# Patient Record
Sex: Female | Born: 1980
Health system: Southern US, Community
[De-identification: ages and names within clinical notes are randomized; demographics above are authoritative.]

## PROBLEM LIST (undated history)

## (undated) DIAGNOSIS — D6851 Activated protein C resistance: Secondary | ICD-10-CM

## (undated) DIAGNOSIS — C801 Malignant (primary) neoplasm, unspecified: Secondary | ICD-10-CM

## (undated) DIAGNOSIS — F32A Depression, unspecified: Secondary | ICD-10-CM

## (undated) DIAGNOSIS — G43909 Migraine, unspecified, not intractable, without status migrainosus: Secondary | ICD-10-CM

## (undated) DIAGNOSIS — I2699 Other pulmonary embolism without acute cor pulmonale: Secondary | ICD-10-CM

## (undated) DIAGNOSIS — R87629 Unspecified abnormal cytological findings in specimens from vagina: Secondary | ICD-10-CM

## (undated) DIAGNOSIS — K5792 Diverticulitis of intestine, part unspecified, without perforation or abscess without bleeding: Secondary | ICD-10-CM

## (undated) DIAGNOSIS — K219 Gastro-esophageal reflux disease without esophagitis: Secondary | ICD-10-CM

## (undated) DIAGNOSIS — M1990 Unspecified osteoarthritis, unspecified site: Secondary | ICD-10-CM

## (undated) DIAGNOSIS — F329 Major depressive disorder, single episode, unspecified: Secondary | ICD-10-CM

## (undated) DIAGNOSIS — M359 Systemic involvement of connective tissue, unspecified: Secondary | ICD-10-CM

## (undated) DIAGNOSIS — C4491 Basal cell carcinoma of skin, unspecified: Secondary | ICD-10-CM

## (undated) DIAGNOSIS — F419 Anxiety disorder, unspecified: Secondary | ICD-10-CM

## (undated) HISTORY — DX: Diverticulitis of intestine, part unspecified, without perforation or abscess without bleeding: K57.92

## (undated) HISTORY — DX: Other pulmonary embolism without acute cor pulmonale: I26.99

## (undated) HISTORY — DX: Unspecified osteoarthritis, unspecified site: M19.90

## (undated) HISTORY — DX: Basal cell carcinoma of skin, unspecified: C44.91

## (undated) HISTORY — PX: UPPER GI ENDOSCOPY: SHX6162

---

## 2004-03-25 HISTORY — PX: LAPAROSCOPIC ENDOMETRIOSIS FULGURATION: SUR769

## 2004-06-15 ENCOUNTER — Ambulatory Visit: Payer: Self-pay | Admitting: Family Medicine

## 2004-06-22 ENCOUNTER — Ambulatory Visit: Payer: Self-pay | Admitting: Unknown Physician Specialty

## 2004-06-27 ENCOUNTER — Ambulatory Visit: Payer: Self-pay | Admitting: Urology

## 2004-09-14 ENCOUNTER — Ambulatory Visit: Payer: Self-pay | Admitting: Unknown Physician Specialty

## 2004-10-11 ENCOUNTER — Ambulatory Visit: Payer: Self-pay | Admitting: Unknown Physician Specialty

## 2004-10-23 ENCOUNTER — Ambulatory Visit: Payer: Self-pay | Admitting: Unknown Physician Specialty

## 2004-11-23 ENCOUNTER — Ambulatory Visit: Payer: Self-pay | Admitting: Unknown Physician Specialty

## 2005-03-21 ENCOUNTER — Ambulatory Visit: Payer: Self-pay | Admitting: Obstetrics & Gynecology

## 2008-01-06 ENCOUNTER — Ambulatory Visit: Payer: Self-pay

## 2011-03-26 HISTORY — PX: FOOT SURGERY: SHX648

## 2011-04-08 ENCOUNTER — Emergency Department: Payer: Self-pay | Admitting: Emergency Medicine

## 2011-04-08 LAB — COMPREHENSIVE METABOLIC PANEL
Albumin: 4.1 g/dL (ref 3.4–5.0)
BUN: 11 mg/dL (ref 7–18)
Calcium, Total: 8.7 mg/dL (ref 8.5–10.1)
EGFR (African American): >60
EGFR (Non-African Amer.): >60
Glucose: 78 mg/dL (ref 65–99)
Osmolality: 280 (ref 275–301)
Potassium: 3.6 mmol/L (ref 3.5–5.1)
SGOT(AST): 22 U/L (ref 15–37)
SGPT (ALT): 21 U/L
Sodium: 141 mmol/L (ref 136–145)
Total Protein: 7.8 g/dL (ref 6.4–8.2)

## 2011-04-08 LAB — CBC
HGB: 13.4 g/dL (ref 12.0–16.0)
MCH: 30.6 pg (ref 26.0–34.0)
MCHC: 34.2 g/dL (ref 32.0–36.0)
MCV: 89 fL (ref 80–100)
RBC: 4.37 10*6/uL (ref 3.80–5.20)
WBC: 6.9 10*3/uL (ref 3.6–11.0)

## 2011-08-09 ENCOUNTER — Emergency Department: Payer: Self-pay | Admitting: Emergency Medicine

## 2011-08-09 LAB — URINALYSIS, COMPLETE
Bilirubin,UR: NEGATIVE
Glucose,UR: NEGATIVE mg/dL (ref 0–75)
Ketone: NEGATIVE
Nitrite: NEGATIVE
Ph: 7 (ref 4.5–8.0)
Protein: NEGATIVE
Specific Gravity: 1.001 (ref 1.003–1.030)
Squamous Epithelial: NONE SEEN
WBC UR: NONE SEEN /HPF (ref 0–5)

## 2011-08-09 LAB — BASIC METABOLIC PANEL
BUN: 10 mg/dL (ref 7–18)
Chloride: 108 mmol/L — ABNORMAL HIGH (ref 98–107)
Co2: 24 mmol/L (ref 21–32)
Creatinine: 0.98 mg/dL (ref 0.60–1.30)
EGFR (Non-African Amer.): >60
Glucose: 93 mg/dL (ref 65–99)
Osmolality: 282 (ref 275–301)
Potassium: 3.7 mmol/L (ref 3.5–5.1)

## 2011-08-09 LAB — CBC
HGB: 13 g/dL (ref 12.0–16.0)
MCH: 30.6 pg (ref 26.0–34.0)
MCV: 88 fL (ref 80–100)
RBC: 4.26 10*6/uL (ref 3.80–5.20)
WBC: 4.2 10*3/uL (ref 3.6–11.0)

## 2012-01-24 HISTORY — PX: FOOT SURGERY: SHX648

## 2013-04-29 ENCOUNTER — Ambulatory Visit: Payer: Self-pay

## 2014-01-27 ENCOUNTER — Ambulatory Visit: Payer: Self-pay

## 2014-02-03 ENCOUNTER — Ambulatory Visit: Payer: Self-pay | Admitting: Urgent Care

## 2014-02-03 LAB — HCG, QUANTITATIVE, PREGNANCY: Beta Hcg, Quant.: <1 m[IU]/mL — ABNORMAL LOW

## 2014-02-04 ENCOUNTER — Ambulatory Visit: Payer: Self-pay | Admitting: Urgent Care

## 2014-02-10 ENCOUNTER — Ambulatory Visit: Payer: Self-pay | Admitting: Gastroenterology

## 2014-02-22 ENCOUNTER — Ambulatory Visit: Payer: Self-pay | Admitting: Urgent Care

## 2014-02-22 LAB — HCG, QUANTITATIVE, PREGNANCY: Beta Hcg, Quant.: <1 m[IU]/mL — ABNORMAL LOW

## 2014-02-22 LAB — URINALYSIS, COMPLETE
BLOOD: NEGATIVE
Bilirubin,UR: NEGATIVE
Glucose,UR: NEGATIVE mg/dL (ref 0–75)
Ketone: NEGATIVE
LEUKOCYTE ESTERASE: NEGATIVE
NITRITE: NEGATIVE
Ph: 7 (ref 4.5–8.0)
Protein: NEGATIVE
Specific Gravity: 1.025 (ref 1.003–1.030)

## 2014-03-22 ENCOUNTER — Ambulatory Visit: Payer: Self-pay

## 2014-03-25 ENCOUNTER — Ambulatory Visit: Payer: Self-pay

## 2014-03-25 HISTORY — PX: DILATION AND CURETTAGE OF UTERUS: SHX78

## 2014-11-10 ENCOUNTER — Encounter
Admission: RE | Disposition: A | Discharge status: Home / Self Care | Payer: Self-pay | Source: Ambulatory Visit | Attending: Obstetrics & Gynecology

## 2014-11-10 ENCOUNTER — Ambulatory Visit
Admission: RE | Admit: 2014-11-10 | Discharge: 2014-11-10 | Disposition: A | Discharge status: Home / Self Care | Payer: BC Managed Care – PPO | Source: Ambulatory Visit | Attending: Obstetrics & Gynecology | Admitting: Obstetrics & Gynecology

## 2014-11-10 ENCOUNTER — Ambulatory Visit: Payer: BC Managed Care – PPO | Admitting: Certified Registered"

## 2014-11-10 ENCOUNTER — Encounter: Payer: Self-pay | Admitting: *Deleted

## 2014-11-10 DIAGNOSIS — O021 Missed abortion: Secondary | ICD-10-CM | POA: Diagnosis not present

## 2014-11-10 DIAGNOSIS — Z3A01 Less than 8 weeks gestation of pregnancy: Secondary | ICD-10-CM | POA: Insufficient documentation

## 2014-11-10 DIAGNOSIS — Z9109 Other allergy status, other than to drugs and biological substances: Secondary | ICD-10-CM | POA: Insufficient documentation

## 2014-11-10 HISTORY — PX: DILATION AND EVACUATION: SHX1459

## 2014-11-10 LAB — BASIC METABOLIC PANEL WITH GFR
Anion gap: 7 (ref 5–15)
BUN: 10 mg/dL (ref 6–20)
CO2: 23 mmol/L (ref 22–32)
Calcium: 8.9 mg/dL (ref 8.9–10.3)
Chloride: 106 mmol/L (ref 101–111)
Creatinine, Ser: 0.82 mg/dL (ref 0.44–1.00)
GFR calc Af Amer: >60 mL/min
GFR calc non Af Amer: >60 mL/min
Glucose, Bld: 90 mg/dL (ref 65–99)
Potassium: 3.9 mmol/L (ref 3.5–5.1)
Sodium: 136 mmol/L (ref 135–145)

## 2014-11-10 LAB — TYPE AND SCREEN
ABO/RH(D): A POS
Antibody Screen: NEGATIVE

## 2014-11-10 LAB — CBC
HEMATOCRIT: 42 % (ref 35.0–47.0)
HEMOGLOBIN: 14.2 g/dL (ref 12.0–16.0)
MCH: 30.1 pg (ref 26.0–34.0)
MCHC: 33.7 g/dL (ref 32.0–36.0)
MCV: 89.1 fL (ref 80.0–100.0)
PLATELETS: 189 10*3/uL (ref 150–440)
RBC: 4.72 MIL/uL (ref 3.80–5.20)
RDW: 12.9 % (ref 11.5–14.5)
WBC: 5.7 10*3/uL (ref 3.6–11.0)

## 2014-11-10 LAB — ABO/RH: ABO/RH(D): A POS

## 2014-11-10 SURGERY — DILATION AND EVACUATION, UTERUS
Anesthesia: General

## 2014-11-10 MED ORDER — ONDANSETRON HCL 4 MG/2ML IJ SOLN
INTRAMUSCULAR | Status: DC | PRN
  Administered 2014-11-10: 4 mg via INTRAVENOUS

## 2014-11-10 MED ORDER — IBUPROFEN 600 MG PO TABS: 600.0000 mg | ORAL_TABLET | Freq: Four times a day (QID) | ORAL | Status: DC | PRN

## 2014-11-10 MED ORDER — PROPOFOL 10 MG/ML IV BOLUS
INTRAVENOUS | Status: DC | PRN
  Administered 2014-11-10: 200 mg via INTRAVENOUS

## 2014-11-10 MED ORDER — FENTANYL CITRATE (PF) 100 MCG/2ML IJ SOLN
25.0000 ug | INTRAMUSCULAR | Status: DC | PRN
  Administered 2014-11-10 (×4): 25 ug via INTRAVENOUS

## 2014-11-10 MED ORDER — ONDANSETRON HCL 4 MG/2ML IJ SOLN
INTRAMUSCULAR | Status: AC
  Filled 2014-11-10: qty 2

## 2014-11-10 MED ORDER — DOXYCYCLINE HYCLATE 200 MG PO TBEC: 200.0000 mg | DELAYED_RELEASE_TABLET | Freq: Once | ORAL | Status: DC

## 2014-11-10 MED ORDER — MIDAZOLAM HCL 2 MG/2ML IJ SOLN
INTRAMUSCULAR | Status: DC | PRN
  Administered 2014-11-10: 2 mg via INTRAVENOUS

## 2014-11-10 MED ORDER — FENTANYL CITRATE (PF) 100 MCG/2ML IJ SOLN
INTRAMUSCULAR | Status: DC | PRN
  Administered 2014-11-10 (×4): 50 ug via INTRAVENOUS

## 2014-11-10 MED ORDER — ONDANSETRON HCL 4 MG/2ML IJ SOLN
4.0000 mg | Freq: Once | INTRAMUSCULAR | Status: AC | PRN
  Administered 2014-11-10: 4 mg via INTRAVENOUS

## 2014-11-10 MED ORDER — DOXYCYCLINE HYCLATE 100 MG IV SOLR
100.0000 mg | Freq: Once | INTRAVENOUS | Status: AC
  Administered 2014-11-10: 100 mg via INTRAVENOUS
  Filled 2014-11-10: qty 100

## 2014-11-10 MED ORDER — LIDOCAINE HCL (CARDIAC) 20 MG/ML IV SOLN
INTRAVENOUS | Status: DC | PRN
  Administered 2014-11-10: 100 mg via INTRAVENOUS

## 2014-11-10 MED ORDER — DEXAMETHASONE SODIUM PHOSPHATE 4 MG/ML IJ SOLN
INTRAMUSCULAR | Status: DC | PRN
  Administered 2014-11-10: 10 mg via INTRAVENOUS

## 2014-11-10 MED ORDER — FENTANYL CITRATE (PF) 100 MCG/2ML IJ SOLN
INTRAMUSCULAR | Status: AC
  Administered 2014-11-10: 25 ug via INTRAVENOUS
  Filled 2014-11-10: qty 2

## 2014-11-10 MED ORDER — LACTATED RINGERS IV SOLN
INTRAVENOUS | Status: DC | PRN
  Administered 2014-11-10: 13:00:00 via INTRAVENOUS

## 2014-11-10 MED ORDER — LACTATED RINGERS IV SOLN
INTRAVENOUS | Status: DC
  Administered 2014-11-10: 11:00:00 via INTRAVENOUS

## 2014-11-10 SURGICAL SUPPLY — 18 items
CATH ROBINSON RED A/P 16FR (CATHETERS) ×3 IMPLANT
GLOVE BIOGEL PI IND STRL 6.5 (GLOVE) ×3 IMPLANT
GLOVE BIOGEL PI INDICATOR 6.5 (GLOVE) ×6
GLOVE SURG SYN 6.5 ES PF (GLOVE) ×3 IMPLANT
GOWN STRL REUS W/ TWL LRG LVL3 (GOWN DISPOSABLE) ×2 IMPLANT
GOWN STRL REUS W/TWL LRG LVL3 (GOWN DISPOSABLE) ×4
KIT RM TURNOVER CYSTO AR (KITS) ×3 IMPLANT
NEEDLE SPNL 20GX3.5 QUINCKE YW (NEEDLE) ×3 IMPLANT
NS IRRIG 500ML POUR BTL (IV SOLUTION) ×3 IMPLANT
PACK DNC HYST (MISCELLANEOUS) ×3 IMPLANT
PAD OB MATERNITY 4.3X12.25 (PERSONAL CARE ITEMS) ×3 IMPLANT
PAD PREP 24X41 OB/GYN DISP (PERSONAL CARE ITEMS) ×3 IMPLANT
SET BERKELEY SUCTION TUBING (SUCTIONS) ×3 IMPLANT
SYRINGE 10CC LL (SYRINGE) ×3 IMPLANT
TOWEL OR 17X26 4PK STRL BLUE (TOWEL DISPOSABLE) ×3 IMPLANT
VACURETTE 10 RIGID CVD (CANNULA) IMPLANT
VACURETTE 12 RIGID CVD (CANNULA) IMPLANT
VACURETTE 8 RIGID CVD (CANNULA) ×3 IMPLANT

## 2014-11-10 NOTE — Anesthesia Procedure Notes (Signed)
Procedure Name: LMA Insertion Date/Time: 11/10/2014 12:53 PM Performed by: Silvana Newness Pre-anesthesia Checklist: Patient identified, Emergency Drugs available, Suction available, Patient being monitored and Timeout performed Patient Re-evaluated:Patient Re-evaluated prior to inductionOxygen Delivery Method: Circle system utilized Preoxygenation: Pre-oxygenation with 100% oxygen Intubation Type: IV induction Ventilation: Mask ventilation without difficulty LMA: LMA inserted LMA Size: 3.5 Number of attempts: 1 Placement Confirmation: positive ETCO2 and breath sounds checked- equal and bilateral Tube secured with: Tape Dental Injury: Teeth and Oropharynx as per pre-operative assessment

## 2014-11-10 NOTE — Transfer of Care (Signed)
Immediate Anesthesia Transfer of Care Note  Patient: Pamela Mills  Procedure(s) Performed: Procedure(s): DILATATION AND EVACUATION (N/A)  Patient Location: PACU  Anesthesia Type:General  Level of Consciousness: awake, alert , oriented and patient cooperative  Airway & Oxygen Therapy: Patient Spontanous Breathing and Patient connected to face mask oxygen  Post-op Assessment: Report given to RN, Post -op Vital signs reviewed and stable and Patient moving all extremities X 4  Post vital signs: Reviewed and stable  Last Vitals:  Filed Vitals:   11/10/14 1329  BP:   Pulse: 93  Temp:   Resp: 21    Complications: No apparent anesthesia complications

## 2014-11-10 NOTE — Anesthesia Preprocedure Evaluation (Signed)
Anesthesia Evaluation  Patient identified by MRN, date of birth, ID band Patient awake    Reviewed: Allergy & Precautions, NPO status , Patient's Chart, lab work & pertinent test results  Airway Mallampati: II       Dental no notable dental hx.    Pulmonary neg pulmonary ROS,    Pulmonary exam normal       Cardiovascular negative cardio ROS Normal cardiovascular exam    Neuro/Psych negative neurological ROS     GI/Hepatic negative GI ROS, Neg liver ROS,   Endo/Other  negative endocrine ROS  Renal/GU negative Renal ROS  negative genitourinary   Musculoskeletal negative musculoskeletal ROS (+)   Abdominal Normal abdominal exam  (+)   Peds negative pediatric ROS (+)  Hematology negative hematology ROS (+)   Anesthesia Other Findings   Reproductive/Obstetrics negative OB ROS                             Anesthesia Physical Anesthesia Plan  ASA: II  Anesthesia Plan: General   Post-op Pain Management:    Induction: Intravenous  Airway Management Planned: LMA  Additional Equipment:   Intra-op Plan:   Post-operative Plan: Extubation in OR  Informed Consent: I have reviewed the patients History and Physical, chart, labs and discussed the procedure including the risks, benefits and alternatives for the proposed anesthesia with the patient or authorized representative who has indicated his/her understanding and acceptance.     Plan Discussed with: CRNA  Anesthesia Plan Comments:         Anesthesia Quick Evaluation

## 2014-11-10 NOTE — Discharge Instructions (Signed)
Nothing in vagina for 2 weeks.  Take 214m doxycycline before bed tonight, with food in your stomach.  You should expect some bleeding, which will subside in the next few days, up to a week.  You should also have some cramping, which should be relieved with ibuprofen and a heating pad.  If your pain is more than this, or bleeding is very heavy, please notify Dr. WLeonides Schanz  Also call for fever/chills, or persistent nausea/vomiting.

## 2014-11-10 NOTE — Op Note (Signed)
Operative Report Suction Dilation and Curettage    Pre-operative Diagnosis: Missed Abortion  Post-operative Diagnosis: same.  Procedure: Suction D&C  Surgeon: Larey Days, MD  Assistant(s):  None  Anesthesia: LMA  Intraoperative medications: doxycycline 18m IV  Estimated Blood Loss:  minimal         Total IV Fluids: 4082m Urine Output: 3m4mnone collected)         Findings: Uterus measuring 8 weeks; normal cervix, vagina, perineum.   Specimens: products of conception         Complications:  None apparent  Disposition: VS stable to PACU          Indication for procedure/Consents: 33 43o. G2P0020 with ultrasound findings consistent with a missed abortion at 6w and 6 days and no cardiac activity (two ultrasounds taken 1week apart with same findings) here for scheduled surgery for the aforementioned diagnoses.  Risks of surgery were discussed with the patient including but not limited to: bleeding which may require transfusion; infection which may require antibiotics; injury to uterus or surrounding organs; intrauterine scarring which may impair future fertility; need for additional procedures including laparotomy or laparoscopy; and other postoperative/anesthesia complications. Written informed consent was obtained.     Procedure in Detail:  The patient was then taken to the operating room where anesthesia was administered and was found to be adequate.  After a formal timeout was performed, she was placed in the dorsal lithotomy position and examined with the above findings. She was then prepped and draped in the sterile manner.  A speculum was then placed in the patient's vagina and a single tooth tenaculum was applied to the anterior lip of the cervix.    The Her cervix was serially dilated to accommodate the 8 french curved suction curette with findings as above. Tissue was removed after 2 passes.  A sharp curettage was then performed until there was a gritty texture in  all four quadrants. The specimen was handed off to nursing. The tenaculum was removed from the anterior lip of the cervix and the vaginal speculum was removed after noting good hemostasis. The patient tolerated the procedure well and was taken to the recovery area awake and in stable condition.  The patient will be discharged to home as per PACU criteria.  Routine postoperative instructions given. She will follow up in the clinic in two to four weeks for postoperative evaluation.  CheLarey DaysD Gynecologist and ObsDublin Medical Center

## 2014-11-10 NOTE — H&P (Signed)
H&P Update  Please see paper H&P in chart.   Pt was last seen in my office, and complete history and physical performed.  The surgical history has been reviewed and remains accurate without interval change. The patient was re-examined and patient's physiologic condition has not changed significantly in the last day.  No new pharmacological allergies or types of therapy has been initiated.  Allergies  Allergen Reactions  . Other Anaphylaxis    gluten   @CURRENTMEDS @ Past Medical History  Diagnosis Date  . Endometriosis    Past Surgical History  Procedure Laterality Date  . Foot surgery Left 01/2012  . Upper gi endoscopy      BP 124/73 mmHg  Pulse 70  Temp(Src) 98.4 F (36.9 C) (Oral)  Resp 16  Ht 5' 8"  (1.727 m)  Wt 99.791 kg (220 lb)  BMI 33.46 kg/m2  SpO2 100%  LMP   NAD RRR no murmurs CTAB, no wheezing, resps unlabored +BS, soft, NTTP No c/c/e Pelvic exam deferred  The above history was confirmed with the patient. The condition still exists that makes this procedure necessary. Surgical plan includes suction D&C as confirmed on the consent. The treatment plan remains the same, without new options for care.  The patient understands the potential benefits and risks and the consents have been signed and placed on the chart.     Larey Days, MD Attending Obstetrician Gynecologist [facility]

## 2014-11-11 ENCOUNTER — Encounter: Payer: Self-pay | Admitting: Obstetrics & Gynecology

## 2014-11-11 LAB — SURGICAL PATHOLOGY

## 2014-11-11 NOTE — Anesthesia Postprocedure Evaluation (Signed)
  Anesthesia Post-op Note  Patient: Pamela Mills  Procedure(s) Performed: Procedure(s): DILATATION AND EVACUATION (N/A)  Anesthesia type:General  Patient location: PACU  Post pain: Pain level controlled  Post assessment: Post-op Vital signs reviewed, Patient's Cardiovascular Status Stable, Respiratory Function Stable, Patent Airway and No signs of Nausea or vomiting  Post vital signs: Reviewed and stable  Last Vitals:  Filed Vitals:   11/10/14 1601  BP: 130/83  Pulse: 67  Temp: 36.8 C  Resp: 16    Level of consciousness: awake, alert  and patient cooperative  Complications: No apparent anesthesia complications

## 2015-04-24 ENCOUNTER — Other Ambulatory Visit: Payer: Self-pay | Admitting: Obstetrics & Gynecology

## 2015-04-24 DIAGNOSIS — R1011 Right upper quadrant pain: Secondary | ICD-10-CM

## 2015-04-24 DIAGNOSIS — N63 Unspecified lump in unspecified breast: Secondary | ICD-10-CM

## 2015-04-27 ENCOUNTER — Other Ambulatory Visit: Payer: Self-pay | Admitting: Obstetrics & Gynecology

## 2015-04-27 ENCOUNTER — Ambulatory Visit
Admission: RE | Admit: 2015-04-27 | Discharge: 2015-04-27 | Disposition: A | Discharge status: Home / Self Care | Payer: BC Managed Care – PPO | Source: Ambulatory Visit | Attending: Obstetrics & Gynecology | Admitting: Obstetrics & Gynecology

## 2015-04-27 DIAGNOSIS — R1011 Right upper quadrant pain: Secondary | ICD-10-CM | POA: Insufficient documentation

## 2015-04-27 DIAGNOSIS — G8929 Other chronic pain: Secondary | ICD-10-CM | POA: Insufficient documentation

## 2015-05-03 ENCOUNTER — Ambulatory Visit
Admission: RE | Admit: 2015-05-03 | Discharge: 2015-05-03 | Disposition: A | Discharge status: Home / Self Care | Payer: BC Managed Care – PPO | Source: Ambulatory Visit | Attending: Obstetrics & Gynecology | Admitting: Obstetrics & Gynecology

## 2015-05-03 DIAGNOSIS — N6489 Other specified disorders of breast: Secondary | ICD-10-CM | POA: Diagnosis not present

## 2015-05-03 DIAGNOSIS — N63 Unspecified lump in unspecified breast: Secondary | ICD-10-CM

## 2015-05-29 ENCOUNTER — Other Ambulatory Visit: Payer: Self-pay | Admitting: Obstetrics & Gynecology

## 2015-05-29 DIAGNOSIS — R1011 Right upper quadrant pain: Secondary | ICD-10-CM

## 2015-05-29 DIAGNOSIS — Q630 Accessory kidney: Secondary | ICD-10-CM

## 2015-05-29 DIAGNOSIS — M25511 Pain in right shoulder: Secondary | ICD-10-CM

## 2015-05-31 ENCOUNTER — Telehealth (HOSPITAL_COMMUNITY): Payer: Self-pay | Admitting: Obstetrics & Gynecology

## 2015-06-01 ENCOUNTER — Ambulatory Visit: Admission: RE | Admit: 2015-06-01 | Payer: BC Managed Care – PPO | Source: Ambulatory Visit

## 2015-06-07 ENCOUNTER — Ambulatory Visit
Admission: RE | Admit: 2015-06-07 | Discharge: 2015-06-07 | Disposition: A | Discharge status: Home / Self Care | Payer: BC Managed Care – PPO | Source: Ambulatory Visit | Attending: Obstetrics & Gynecology | Admitting: Obstetrics & Gynecology

## 2015-06-07 DIAGNOSIS — R1011 Right upper quadrant pain: Secondary | ICD-10-CM | POA: Diagnosis present

## 2015-06-07 DIAGNOSIS — N200 Calculus of kidney: Secondary | ICD-10-CM | POA: Diagnosis not present

## 2015-06-07 MED ORDER — IOHEXOL 300 MG/ML  SOLN
100.0000 mL | Freq: Once | INTRAMUSCULAR | Status: AC | PRN
  Administered 2015-06-07: 100 mL via INTRAVENOUS

## 2015-06-28 ENCOUNTER — Other Ambulatory Visit: Payer: Self-pay | Admitting: General Surgery

## 2015-06-28 ENCOUNTER — Encounter (HOSPITAL_BASED_OUTPATIENT_CLINIC_OR_DEPARTMENT_OTHER): Payer: Self-pay | Admitting: *Deleted

## 2015-06-28 NOTE — Progress Notes (Addendum)
To [facility] at [place] /order, urine pregnancy,urinalysis on arrival-Instructed Npo after Mn-asked her to bring gluten free snack  for post op,due to limited options here.Shower in am with hibiclens.

## 2015-06-29 ENCOUNTER — Encounter (HOSPITAL_BASED_OUTPATIENT_CLINIC_OR_DEPARTMENT_OTHER): Payer: Self-pay | Admitting: *Deleted

## 2015-06-29 ENCOUNTER — Ambulatory Visit (HOSPITAL_BASED_OUTPATIENT_CLINIC_OR_DEPARTMENT_OTHER): Payer: BC Managed Care – PPO | Admitting: Anesthesiology

## 2015-06-29 ENCOUNTER — Ambulatory Visit (HOSPITAL_BASED_OUTPATIENT_CLINIC_OR_DEPARTMENT_OTHER)
Admission: RE | Admit: 2015-06-29 | Discharge: 2015-06-29 | Disposition: A | Discharge status: Home / Self Care | Payer: BC Managed Care – PPO | Source: Ambulatory Visit | Attending: General Surgery | Admitting: General Surgery

## 2015-06-29 ENCOUNTER — Encounter (HOSPITAL_BASED_OUTPATIENT_CLINIC_OR_DEPARTMENT_OTHER)
Admission: RE | Disposition: A | Discharge status: Home / Self Care | Payer: Self-pay | Source: Ambulatory Visit | Attending: General Surgery

## 2015-06-29 DIAGNOSIS — E669 Obesity, unspecified: Secondary | ICD-10-CM | POA: Diagnosis not present

## 2015-06-29 DIAGNOSIS — F419 Anxiety disorder, unspecified: Secondary | ICD-10-CM | POA: Diagnosis not present

## 2015-06-29 DIAGNOSIS — Z6833 Body mass index (BMI) 33.0-33.9, adult: Secondary | ICD-10-CM | POA: Insufficient documentation

## 2015-06-29 DIAGNOSIS — F329 Major depressive disorder, single episode, unspecified: Secondary | ICD-10-CM | POA: Insufficient documentation

## 2015-06-29 DIAGNOSIS — R1011 Right upper quadrant pain: Secondary | ICD-10-CM | POA: Diagnosis present

## 2015-06-29 DIAGNOSIS — K819 Cholecystitis, unspecified: Secondary | ICD-10-CM | POA: Insufficient documentation

## 2015-06-29 DIAGNOSIS — Z79899 Other long term (current) drug therapy: Secondary | ICD-10-CM | POA: Diagnosis not present

## 2015-06-29 HISTORY — DX: Major depressive disorder, single episode, unspecified: F32.9

## 2015-06-29 HISTORY — PX: CHOLECYSTECTOMY: SHX55

## 2015-06-29 HISTORY — DX: Depression, unspecified: F32.A

## 2015-06-29 HISTORY — DX: Anxiety disorder, unspecified: F41.9

## 2015-06-29 LAB — POCT PREGNANCY, URINE: PREG TEST UR: NEGATIVE

## 2015-06-29 LAB — CBC WITH DIFFERENTIAL/PLATELET
BASOS PCT: 1 %
Basophils Absolute: 0 10*3/uL (ref 0.0–0.1)
Eosinophils Absolute: 0.1 10*3/uL (ref 0.0–0.7)
Eosinophils Relative: 4 %
HCT: 37 % (ref 36.0–46.0)
HEMOGLOBIN: 12.9 g/dL (ref 12.0–15.0)
LYMPHS ABS: 0.5 10*3/uL — AB (ref 0.7–4.0)
Lymphocytes Relative: 16 %
MCH: 31.2 pg (ref 26.0–34.0)
MCHC: 34.9 g/dL (ref 30.0–36.0)
MCV: 89.4 fL (ref 78.0–100.0)
MONO ABS: 0.3 10*3/uL (ref 0.1–1.0)
MONOS PCT: 8 %
NEUTROS ABS: 2.4 10*3/uL (ref 1.7–7.7)
Neutrophils Relative %: 71 %
Platelets: 201 10*3/uL (ref 150–400)
RBC: 4.14 MIL/uL (ref 3.87–5.11)
RDW: 12.9 % (ref 11.5–15.5)
WBC: 3.3 10*3/uL — ABNORMAL LOW (ref 4.0–10.5)

## 2015-06-29 LAB — COMPREHENSIVE METABOLIC PANEL
ALBUMIN: 3.8 g/dL (ref 3.5–5.0)
ALK PHOS: 27 U/L — AB (ref 38–126)
ALT: 12 U/L — ABNORMAL LOW (ref 14–54)
ANION GAP: 7 (ref 5–15)
AST: 20 U/L (ref 15–41)
BILIRUBIN TOTAL: 0.6 mg/dL (ref 0.3–1.2)
BUN: 10 mg/dL (ref 6–20)
CALCIUM: 8.8 mg/dL — AB (ref 8.9–10.3)
CO2: 22 mmol/L (ref 22–32)
CREATININE: 0.84 mg/dL (ref 0.44–1.00)
Chloride: 110 mmol/L (ref 101–111)
GLUCOSE: 90 mg/dL (ref 65–99)
POTASSIUM: 4.1 mmol/L (ref 3.5–5.1)
Sodium: 139 mmol/L (ref 135–145)
TOTAL PROTEIN: 6.8 g/dL (ref 6.5–8.1)

## 2015-06-29 LAB — URINALYSIS, ROUTINE W REFLEX MICROSCOPIC
Bilirubin Urine: NEGATIVE
GLUCOSE, UA: NEGATIVE mg/dL
HGB URINE DIPSTICK: NEGATIVE
KETONES UR: NEGATIVE mg/dL
Leukocytes, UA: NEGATIVE
Nitrite: NEGATIVE
PROTEIN: NEGATIVE mg/dL
Specific Gravity, Urine: 1.022 (ref 1.005–1.030)
pH: 6 (ref 5.0–8.0)

## 2015-06-29 SURGERY — LAPAROSCOPIC CHOLECYSTECTOMY
Anesthesia: General | Site: Abdomen

## 2015-06-29 MED ORDER — ONDANSETRON HCL 4 MG/2ML IJ SOLN
INTRAMUSCULAR | Status: AC
  Filled 2015-06-29: qty 2

## 2015-06-29 MED ORDER — DEXAMETHASONE SODIUM PHOSPHATE 10 MG/ML IJ SOLN
INTRAMUSCULAR | Status: AC
  Filled 2015-06-29: qty 1

## 2015-06-29 MED ORDER — HYDROMORPHONE HCL 1 MG/ML IJ SOLN
0.2500 mg | INTRAMUSCULAR | Status: DC | PRN
  Administered 2015-06-29 (×3): 0.5 mg via INTRAVENOUS
  Filled 2015-06-29: qty 1

## 2015-06-29 MED ORDER — HYDROMORPHONE HCL 1 MG/ML IJ SOLN
INTRAMUSCULAR | Status: AC
  Filled 2015-06-29: qty 1

## 2015-06-29 MED ORDER — GLYCOPYRROLATE 0.2 MG/ML IJ SOLN
INTRAMUSCULAR | Status: DC | PRN
  Administered 2015-06-29 (×3): 0.2 mg via INTRAVENOUS

## 2015-06-29 MED ORDER — ACETAMINOPHEN 650 MG RE SUPP
650.0000 mg | RECTAL | Status: DC | PRN
  Filled 2015-06-29: qty 1

## 2015-06-29 MED ORDER — PROPOFOL 10 MG/ML IV BOLUS
INTRAVENOUS | Status: AC
  Filled 2015-06-29: qty 20

## 2015-06-29 MED ORDER — OXYCODONE HCL 5 MG PO TABS
ORAL_TABLET | ORAL | Status: AC
  Filled 2015-06-29: qty 1

## 2015-06-29 MED ORDER — LIDOCAINE HCL (CARDIAC) 20 MG/ML IV SOLN
INTRAVENOUS | Status: DC | PRN
  Administered 2015-06-29: 60 mg via INTRAVENOUS

## 2015-06-29 MED ORDER — KETOROLAC TROMETHAMINE 30 MG/ML IJ SOLN
INTRAMUSCULAR | Status: DC | PRN
  Administered 2015-06-29: 30 mg via INTRAVENOUS

## 2015-06-29 MED ORDER — METOCLOPRAMIDE HCL 5 MG/ML IJ SOLN
INTRAMUSCULAR | Status: DC | PRN
  Administered 2015-06-29: 5 mg via INTRAVENOUS

## 2015-06-29 MED ORDER — DEXAMETHASONE SODIUM PHOSPHATE 4 MG/ML IJ SOLN
INTRAMUSCULAR | Status: DC | PRN
  Administered 2015-06-29: 10 mg via INTRAVENOUS

## 2015-06-29 MED ORDER — SUCCINYLCHOLINE CHLORIDE 20 MG/ML IJ SOLN
INTRAMUSCULAR | Status: DC | PRN
  Administered 2015-06-29: 100 mg via INTRAVENOUS

## 2015-06-29 MED ORDER — OXYCODONE HCL 5 MG PO TABS
5.0000 mg | ORAL_TABLET | ORAL | Status: DC | PRN
  Filled 2015-06-29: qty 2

## 2015-06-29 MED ORDER — FENTANYL CITRATE (PF) 100 MCG/2ML IJ SOLN
INTRAMUSCULAR | Status: DC | PRN
  Administered 2015-06-29: 25 ug via INTRAVENOUS
  Administered 2015-06-29: 50 ug via INTRAVENOUS
  Administered 2015-06-29: 25 ug via INTRAVENOUS
  Administered 2015-06-29 (×2): 50 ug via INTRAVENOUS

## 2015-06-29 MED ORDER — SODIUM CHLORIDE 0.9% FLUSH
3.0000 mL | Freq: Two times a day (BID) | INTRAVENOUS | Status: DC
  Filled 2015-06-29: qty 3

## 2015-06-29 MED ORDER — FENTANYL CITRATE (PF) 100 MCG/2ML IJ SOLN
INTRAMUSCULAR | Status: AC
  Filled 2015-06-29: qty 2

## 2015-06-29 MED ORDER — LACTATED RINGERS IV SOLN
INTRAVENOUS | Status: DC
  Administered 2015-06-29 (×3): via INTRAVENOUS
  Filled 2015-06-29: qty 1000

## 2015-06-29 MED ORDER — ONDANSETRON HCL 4 MG/2ML IJ SOLN
4.0000 mg | Freq: Four times a day (QID) | INTRAMUSCULAR | Status: AC | PRN
  Administered 2015-06-29: 4 mg via INTRAVENOUS
  Filled 2015-06-29: qty 2

## 2015-06-29 MED ORDER — ARTIFICIAL TEARS OP OINT
TOPICAL_OINTMENT | OPHTHALMIC | Status: AC
  Filled 2015-06-29: qty 3.5

## 2015-06-29 MED ORDER — SODIUM CHLORIDE 0.9 % IV SOLN
250.0000 mL | INTRAVENOUS | Status: DC | PRN
  Filled 2015-06-29: qty 250

## 2015-06-29 MED ORDER — SODIUM CHLORIDE 0.9% FLUSH
3.0000 mL | INTRAVENOUS | Status: DC | PRN
  Filled 2015-06-29: qty 3

## 2015-06-29 MED ORDER — GLYCOPYRROLATE 0.2 MG/ML IJ SOLN
INTRAMUSCULAR | Status: AC
  Filled 2015-06-29: qty 1

## 2015-06-29 MED ORDER — CEFAZOLIN SODIUM-DEXTROSE 2-3 GM-% IV SOLR
INTRAVENOUS | Status: AC
  Filled 2015-06-29: qty 50

## 2015-06-29 MED ORDER — LACTATED RINGERS IR SOLN
Status: DC | PRN
  Administered 2015-06-29: 3000 mL

## 2015-06-29 MED ORDER — PROMETHAZINE HCL 25 MG/ML IJ SOLN
INTRAMUSCULAR | Status: AC
  Filled 2015-06-29: qty 1

## 2015-06-29 MED ORDER — OXYCODONE-ACETAMINOPHEN 5-325 MG PO TABS: 1.0000 | ORAL_TABLET | ORAL | Status: DC | PRN

## 2015-06-29 MED ORDER — BUPIVACAINE-EPINEPHRINE (PF) 0.25% -1:200000 IJ SOLN
INTRAMUSCULAR | Status: DC | PRN
  Administered 2015-06-29: 10 mL

## 2015-06-29 MED ORDER — OXYCODONE HCL 5 MG PO TABS
5.0000 mg | ORAL_TABLET | Freq: Once | ORAL | Status: AC | PRN
  Administered 2015-06-29: 5 mg via ORAL
  Filled 2015-06-29: qty 1

## 2015-06-29 MED ORDER — NEOSTIGMINE METHYLSULFATE 10 MG/10ML IV SOLN
INTRAVENOUS | Status: AC
  Filled 2015-06-29: qty 1

## 2015-06-29 MED ORDER — PROPOFOL 10 MG/ML IV BOLUS
INTRAVENOUS | Status: DC | PRN
  Administered 2015-06-29: 200 mg via INTRAVENOUS

## 2015-06-29 MED ORDER — OXYCODONE HCL 5 MG/5ML PO SOLN
5.0000 mg | Freq: Once | ORAL | Status: AC | PRN
  Filled 2015-06-29: qty 5

## 2015-06-29 MED ORDER — CEFAZOLIN SODIUM-DEXTROSE 2-4 GM/100ML-% IV SOLN
2.0000 g | INTRAVENOUS | Status: AC
Start: 2015-06-29 — End: 2015-06-29
  Administered 2015-06-29: 2 g via INTRAVENOUS
  Filled 2015-06-29: qty 100

## 2015-06-29 MED ORDER — ROCURONIUM BROMIDE 100 MG/10ML IV SOLN
INTRAVENOUS | Status: DC | PRN
  Administered 2015-06-29: 20 mg via INTRAVENOUS
  Administered 2015-06-29: 10 mg via INTRAVENOUS

## 2015-06-29 MED ORDER — PROMETHAZINE HCL 25 MG/ML IJ SOLN
6.2500 mg | Freq: Once | INTRAMUSCULAR | Status: AC
  Administered 2015-06-29: 6.25 mg via INTRAVENOUS
  Filled 2015-06-29: qty 1

## 2015-06-29 MED ORDER — GLYCOPYRROLATE 0.2 MG/ML IJ SOLN
INTRAMUSCULAR | Status: AC
  Filled 2015-06-29: qty 3

## 2015-06-29 MED ORDER — NEOSTIGMINE METHYLSULFATE 10 MG/10ML IV SOLN
INTRAVENOUS | Status: DC | PRN
  Administered 2015-06-29: 3 mg via INTRAVENOUS

## 2015-06-29 MED ORDER — LIDOCAINE HCL (PF) 1 % IJ SOLN
INTRAMUSCULAR | Status: DC | PRN
  Administered 2015-06-29: 10 mL

## 2015-06-29 MED ORDER — MIDAZOLAM HCL 2 MG/2ML IJ SOLN
INTRAMUSCULAR | Status: AC
  Filled 2015-06-29: qty 2

## 2015-06-29 MED ORDER — METOCLOPRAMIDE HCL 5 MG/ML IJ SOLN
INTRAMUSCULAR | Status: AC
  Filled 2015-06-29: qty 2

## 2015-06-29 MED ORDER — ACETAMINOPHEN 325 MG PO TABS
650.0000 mg | ORAL_TABLET | ORAL | Status: DC | PRN
  Filled 2015-06-29: qty 2

## 2015-06-29 MED ORDER — MIDAZOLAM HCL 5 MG/5ML IJ SOLN
INTRAMUSCULAR | Status: DC | PRN
  Administered 2015-06-29: 2 mg via INTRAVENOUS

## 2015-06-29 MED ORDER — ONDANSETRON HCL 4 MG/2ML IJ SOLN
INTRAMUSCULAR | Status: DC | PRN
  Administered 2015-06-29: 4 mg via INTRAVENOUS

## 2015-06-29 MED FILL — OXYCODONE/APAP 5/325MG: 5-325 | 3 days supply | Qty: 30 | Fill #0

## 2015-06-29 SURGICAL SUPPLY — 37 items
APPLIER CLIP ROT 10 11.4 M/L (STAPLE) ×3
CHLORAPREP W/TINT 26ML (MISCELLANEOUS) ×3 IMPLANT
CLIP APPLIE ROT 10 11.4 M/L (STAPLE) ×1 IMPLANT
CLIP LIGATING HEMO O LOK GREEN (MISCELLANEOUS) IMPLANT
COVER MAYO STAND STRL (DRAPES) IMPLANT
COVER SURGICAL LIGHT HANDLE (MISCELLANEOUS) ×3 IMPLANT
DECANTER SPIKE VIAL GLASS SM (MISCELLANEOUS) ×3 IMPLANT
DRAPE C-ARM 42X120 X-RAY (DRAPES) IMPLANT
DRAPE LAPAROSCOPIC ABDOMINAL (DRAPES) ×3 IMPLANT
ELECT L-HOOK LAP 45CM DISP (ELECTROSURGICAL)
ELECT PENCIL ROCKER SW 15FT (MISCELLANEOUS) ×3 IMPLANT
ELECT REM PT RETURN 9FT ADLT (ELECTROSURGICAL) ×3
ELECTRODE L-HOOK LAP 45CM DISP (ELECTROSURGICAL) IMPLANT
ELECTRODE REM PT RTRN 9FT ADLT (ELECTROSURGICAL) ×1 IMPLANT
GLOVE BIO SURGEON STRL SZ 6 (GLOVE) ×3 IMPLANT
GLOVE INDICATOR 6.5 STRL GRN (GLOVE) ×3 IMPLANT
GOWN STRL REUS W/TWL 2XL LVL3 (GOWN DISPOSABLE) ×3 IMPLANT
GOWN STRL REUS W/TWL XL LVL3 (GOWN DISPOSABLE) ×6 IMPLANT
HEMOSTAT SNOW SURGICEL 2X4 (HEMOSTASIS) IMPLANT
KIT BASIN OR (CUSTOM PROCEDURE TRAY) ×3 IMPLANT
L-HOOK LAP DISP 36CM (ELECTROSURGICAL)
LHOOK LAP DISP 36CM (ELECTROSURGICAL) IMPLANT
LIQUID BAND (GAUZE/BANDAGES/DRESSINGS) ×3 IMPLANT
POSITIONER SURGICAL ARM (MISCELLANEOUS) IMPLANT
POUCH SPECIMEN RETRIEVAL 10MM (ENDOMECHANICALS) ×3 IMPLANT
SCISSORS LAP 5X35 DISP (ENDOMECHANICALS) ×3 IMPLANT
SET CHOLANGIOGRAPH MIX (MISCELLANEOUS) IMPLANT
SET IRRIG TUBING LAPAROSCOPIC (IRRIGATION / IRRIGATOR) ×3 IMPLANT
SLEEVE XCEL OPT CAN 5 100 (ENDOMECHANICALS) ×3 IMPLANT
SUT MNCRL AB 4-0 PS2 18 (SUTURE) ×3 IMPLANT
TAPE CLOTH 4X10 WHT NS (GAUZE/BANDAGES/DRESSINGS) IMPLANT
TOWEL OR 17X26 10 PK STRL BLUE (TOWEL DISPOSABLE) ×3 IMPLANT
TOWEL OR NON WOVEN STRL DISP B (DISPOSABLE) ×3 IMPLANT
TRAY LAPAROSCOPIC (CUSTOM PROCEDURE TRAY) ×3 IMPLANT
TROCAR BLADELESS OPT 5 100 (ENDOMECHANICALS) ×3 IMPLANT
TROCAR XCEL BLUNT TIP 100MML (ENDOMECHANICALS) ×3 IMPLANT
TROCAR XCEL NON-BLD 11X100MML (ENDOMECHANICALS) ×3 IMPLANT

## 2015-06-29 NOTE — Anesthesia Postprocedure Evaluation (Signed)
Anesthesia Post Note  Patient: Pamela Mills  Procedure(s) Performed: Procedure(s) (LRB): LAPAROSCOPIC CHOLECYSTECTOMY (N/A)  Patient location during evaluation: PACU Anesthesia Type: General Level of consciousness: awake and alert and patient cooperative Pain management: pain level controlled Vital Signs Assessment: post-procedure vital signs reviewed and stable Respiratory status: spontaneous breathing and respiratory function stable Cardiovascular status: stable Anesthetic complications: no    Last Vitals:  Filed Vitals:   06/29/15 1230 06/29/15 1245  BP: 116/69   Pulse: 74 86  Temp:    Resp: 15 14    Last Pain:  Filed Vitals:   06/29/15 1300  PainSc: 3                  Gabrielle Mester S

## 2015-06-29 NOTE — Anesthesia Procedure Notes (Signed)
Procedure Name: Intubation Date/Time: 06/29/2015 9:01 AM Performed by: Mechele Claude Pre-anesthesia Checklist: Patient identified, Emergency Drugs available, Suction available and Patient being monitored Patient Re-evaluated:Patient Re-evaluated prior to inductionOxygen Delivery Method: Circle System Utilized Preoxygenation: Pre-oxygenation with 100% oxygen Intubation Type: IV induction Ventilation: Mask ventilation without difficulty Laryngoscope Size: Mac and 1 Grade View: Grade I Tube type: Oral Tube size: 7.0 mm Number of attempts: 1 Airway Equipment and Method: Stylet and LTA kit utilized Placement Confirmation: ETT inserted through vocal cords under direct vision,  positive ETCO2 and breath sounds checked- equal and bilateral Secured at: 20 cm Tube secured with: Tape Dental Injury: Teeth and Oropharynx as per pre-operative assessment

## 2015-06-29 NOTE — Transfer of Care (Signed)
Last Vitals:  Filed Vitals:   06/29/15 0708  BP: 128/83  Pulse: 69  Temp: 36.6 C  Resp: 16    Immediate Anesthesia Transfer of Care Note  Patient: Pamela Mills  Procedure(s) Performed: Procedure(s) (LRB): LAPAROSCOPIC CHOLECYSTECTOMY (N/A)  Patient Location: PACU  Anesthesia Type: General  Level of Consciousness: awake, alert  and oriented  Airway & Oxygen Therapy: Patient Spontanous Breathing and Patient connected to nasal cannula oxygen  Post-op Assessment: Report given to PACU RN and Post -op Vital signs reviewed and stable  Post vital signs: Reviewed and stable  Complications: No apparent anesthesia complications

## 2015-06-29 NOTE — H&P (Signed)
Pamela Mills is an 35 y.o. female.   Chief Complaint: RUQ pain HPI:  Patient has had RUQ pain constantly for 1.5 years.  It came on gradually, but now it is there all the time.  She has episodes of sharp pain intermittently that last a few seconds to 5 minutes.  She has had extensive workup including U/S, HIDA, EGD, CT, MRI, colonoscopy and all have normal results.  However, she had severe pain with CCK injection that was exactly like the sharp pain episodes that she has.  Also, she has progressed to having nausea when she eats.  She has celiac, but has been on a strict gluten free diet for 9 years.      Past Medical History  Diagnosis Date  . Anxiety   . Depression     Past Surgical History  Procedure Laterality Date  . Foot surgery Left 01/2012  . Upper gi endoscopy    . Dilation and evacuation N/A 11/10/2014    Procedure: DILATATION AND EVACUATION;  Surgeon: Honor Loh Ward, MD;  Location: [facility] ORS;  Service: Gynecology;  Laterality: N/A;  . Laparoscopic endometriosis fulguration  2006    Family History  Problem Relation Age of Onset  . Ovarian cancer Mother 10  . Breast cancer Neg Hx    Social History:  reports that she has never smoked. She does not have any smokeless tobacco history on file. She reports that she drinks alcohol. She reports that she does not use illicit drugs.  Allergies:  Allergies  Allergen Reactions  . Other Other (See Comments)    Gluten causes inflammation in GI system.    Medications Prior to Admission  Medication Sig Dispense Refill  . cetirizine (ZYRTEC) 10 MG tablet Take 10 mg by mouth daily.    Marland Kitchen escitalopram (LEXAPRO) 5 MG tablet Take 5 mg by mouth every evening.    . fluticasone (FLONASE) 50 MCG/ACT nasal spray Place into both nostrils as needed for allergies or rhinitis.    Marland Kitchen ibuprofen (ADVIL,MOTRIN) 600 MG tablet Take 1 tablet (600 mg total) by mouth every 6 (six) hours as needed. 30 tablet 0  . LORazepam (ATIVAN) 0.5 MG tablet Take 0.5 mg  by mouth as needed for anxiety.    . norgestimate-ethinyl estradiol (ORTHO-CYCLEN,SPRINTEC,PREVIFEM) 0.25-35 MG-MCG tablet Take 1 tablet by mouth daily.    . Doxycycline Hyclate 200 MG TBEC Take 200 mg by mouth once. 1 tablet 0    Results for orders placed or performed during the hospital encounter of 06/29/15 (from the past 48 hour(s))  Urinalysis, Routine w reflex microscopic (not at [facility])     Status: None   Collection Time: 06/29/15  7:10 AM  Result Value Ref Range   Color, Urine YELLOW YELLOW   APPearance CLEAR CLEAR   Specific Gravity, Urine 1.022 1.005 - 1.030   pH 6.0 5.0 - 8.0   Glucose, UA NEGATIVE NEGATIVE mg/dL   Hgb urine dipstick NEGATIVE NEGATIVE   Bilirubin Urine NEGATIVE NEGATIVE   Ketones, ur NEGATIVE NEGATIVE mg/dL   Protein, ur NEGATIVE NEGATIVE mg/dL   Nitrite NEGATIVE NEGATIVE   Leukocytes, UA NEGATIVE NEGATIVE    Comment: MICROSCOPIC NOT DONE ON URINES WITH NEGATIVE PROTEIN, BLOOD, LEUKOCYTES, NITRITE, OR GLUCOSE <1000 mg/dL.  Pregnancy, urine POC     Status: None   Collection Time: 06/29/15  7:16 AM  Result Value Ref Range   Preg Test, Ur NEGATIVE NEGATIVE    Comment:        THE SENSITIVITY  OF THIS METHODOLOGY IS >24 mIU/mL   CBC WITH DIFFERENTIAL     Status: Abnormal   Collection Time: 06/29/15  7:30 AM  Result Value Ref Range   WBC 3.3 (L) 4.0 - 10.5 K/uL   RBC 4.14 3.87 - 5.11 MIL/uL   Hemoglobin 12.9 12.0 - 15.0 g/dL   HCT 37.0 36.0 - 46.0 %   MCV 89.4 78.0 - 100.0 fL   MCH 31.2 26.0 - 34.0 pg   MCHC 34.9 30.0 - 36.0 g/dL   RDW 12.9 11.5 - 15.5 %   Platelets 201 150 - 400 K/uL   Neutrophils Relative % 71 %   Neutro Abs 2.4 1.7 - 7.7 K/uL   Lymphocytes Relative 16 %   Lymphs Abs 0.5 (L) 0.7 - 4.0 K/uL   Monocytes Relative 8 %   Monocytes Absolute 0.3 0.1 - 1.0 K/uL   Eosinophils Relative 4 %   Eosinophils Absolute 0.1 0.0 - 0.7 K/uL   Basophils Relative 1 %   Basophils Absolute 0.0 0.0 - 0.1 K/uL  Comprehensive metabolic panel     Status:  Abnormal   Collection Time: 06/29/15  7:30 AM  Result Value Ref Range   Sodium 139 135 - 145 mmol/L   Potassium 4.1 3.5 - 5.1 mmol/L   Chloride 110 101 - 111 mmol/L   CO2 22 22 - 32 mmol/L   Glucose, Bld 90 65 - 99 mg/dL   BUN 10 6 - 20 mg/dL   Creatinine, Ser 0.84 0.44 - 1.00 mg/dL   Calcium 8.8 (L) 8.9 - 10.3 mg/dL   Total Protein 6.8 6.5 - 8.1 g/dL   Albumin 3.8 3.5 - 5.0 g/dL   AST 20 15 - 41 U/L   ALT 12 (L) 14 - 54 U/L   Alkaline Phosphatase 27 (L) 38 - 126 U/L   Total Bilirubin 0.6 0.3 - 1.2 mg/dL   GFR calc non Af Amer >60 >60 mL/min   GFR calc Af Amer >60 >60 mL/min    Comment: (NOTE) The eGFR has been calculated using the CKD EPI equation. This calculation has not been validated in all clinical situations. eGFR's persistently <60 mL/min signify possible Chronic Kidney Disease.    Anion gap 7 5 - 15   No results found.  Review of Systems  All other systems reviewed and are negative.   Blood pressure 128/83, pulse 69, temperature 97.9 F (36.6 C), temperature source Oral, resp. rate 16, height 5' 8"  (1.727 m), weight 98.884 kg (218 lb), last menstrual period 06/12/2015, SpO2 98 %. Physical Exam  Constitutional: She is oriented to person, place, and time. She appears well-developed and well-nourished. She appears distressed.  HENT:  Head: Normocephalic and atraumatic.  Eyes: Conjunctivae are normal. Pupils are equal, round, and reactive to light. No scleral icterus.  Neck: Normal range of motion. No thyromegaly present.  Cardiovascular: Normal rate, regular rhythm and intact distal pulses.   Respiratory: Effort normal. No respiratory distress. She exhibits no tenderness.  GI: Soft. She exhibits no distension. There is tenderness (RUQ).  Musculoskeletal: Normal range of motion.  Lymphadenopathy:    She has no cervical adenopathy.  Neurological: She is alert and oriented to person, place, and time. Coordination normal.  Skin: Skin is warm and dry. No rash noted.  She is not diaphoretic. No erythema. No pallor.  Psychiatric: She has a normal mood and affect. Her behavior is normal. Judgment and thought content normal.     Assessment/Plan RUQ pain, chronic cholecystitis suspected.  I am cautiously optimistic about lap chole relieving her pain given the presence of tenderness, post prandial nausea, and pain with CCK injection.  We had a long discussion about the pros and cons of surgery, as well as the risks.    I discussed that there is a reasonable chance that we will not improve her pain with surgery.    The surgical procedure was described to the patient in detail.  The patient was given educational material.  I discussed the incision type and location, the location of the gallbladder, the anatomy of the bile ducts and arteries, and the typical progression of surgery.  I discussed the possibility of converting to an open operation.  I advised of the risks of bleeding, infection, damage to other structures (such as the bile duct, intestine or liver), bile leak, need for other procedures or surgeries, and post op diarrhea/constipation.  We discussed the risk of blood clot.  We discussed the recovery period and post operative restrictions.  The patient was advised against taking blood thinners the week before surgery.       Stark Klein, MD 06/29/2015, 8:48 AM

## 2015-06-29 NOTE — Anesthesia Preprocedure Evaluation (Signed)
Anesthesia Evaluation  Patient identified by MRN, date of birth, ID band Patient awake    Reviewed: Allergy & Precautions, NPO status , Patient's Chart, lab work & pertinent test results  Airway Mallampati: II   Neck ROM: full    Dental   Pulmonary neg pulmonary ROS,    breath sounds clear to auscultation       Cardiovascular negative cardio ROS   Rhythm:regular Rate:Normal     Neuro/Psych Anxiety Depression    GI/Hepatic   Endo/Other  obese  Renal/GU      Musculoskeletal   Abdominal   Peds  Hematology   Anesthesia Other Findings   Reproductive/Obstetrics                             Anesthesia Physical Anesthesia Plan  ASA: II  Anesthesia Plan: General   Post-op Pain Management:    Induction: Intravenous  Airway Management Planned: Oral ETT  Additional Equipment:   Intra-op Plan:   Post-operative Plan: Extubation in OR  Informed Consent: I have reviewed the patients History and Physical, chart, labs and discussed the procedure including the risks, benefits and alternatives for the proposed anesthesia with the patient or authorized representative who has indicated his/her understanding and acceptance.     Plan Discussed with: CRNA, Anesthesiologist and Surgeon  Anesthesia Plan Comments:         Anesthesia Quick Evaluation

## 2015-06-29 NOTE — Op Note (Signed)
Laparoscopic Cholecystectomy  Indications: This patient presents with chronic RUQ pain and will undergo laparoscopic cholecystectomy.  She has had extensive negative workup, but continues to have constant RUQ pain and tenderness, post prandial nausea, and had pain with CCK injection on HIDA x 2.    Pre-operative Diagnosis: RUQ pain  Post-operative Diagnosis: Same  Surgeon: Stark Klein   Assistants: n/a  Anesthesia: General endotracheal anesthesia and local  ASA Class: 2  Procedure Details  The patient was seen again in the Holding Room. The risks, benefits, complications, treatment options, and expected outcomes were discussed with the patient. The possibilities of  bleeding, recurrent infection, damage to nearby structures, the need for additional procedures, failure to diagnose a condition, the possible need to convert to an open procedure, and creating a complication requiring transfusion or operation were discussed with the patient. The likelihood of improving the patient's symptoms with return to their baseline status is good.    The patient and/or family concurred with the proposed plan, giving informed consent. The site of surgery properly noted. The patient was taken to Operating Room, and the procedure verified as Laparoscopic Cholecystectomy with Intraoperative Cholangiogram. A Time Out was held and the above information confirmed.  Prior to the induction of general anesthesia, antibiotic prophylaxis was administered. General endotracheal anesthesia was then administered and tolerated well. After the induction, the abdomen was prepped with Chloraprep and draped in the sterile fashion. The patient was positioned in the supine position.  Local anesthetic agent was injected into the skin near the umbilicus and an incision made. We dissected down to the abdominal fascia with blunt dissection.  The fascia was incised vertically and we entered the peritoneal cavity bluntly.  A pursestring  suture of 0-Vicryl was placed around the fascial opening.  The Hasson cannula was inserted and secured with the stay suture.  Pneumoperitoneum was then created with CO2 and tolerated well without any adverse changes in the patient's vital signs. An 11-mm port was placed in the subxiphoid position.  Two 5-mm ports were placed in the right upper quadrant. All skin incisions were infiltrated with a local anesthetic agent before making the incision and placing the trocars.   We positioned the patient in reverse Trendelenburg, tilted slightly to the patient's left.  The gallbladder was identified, the fundus grasped and retracted cephalad. Adhesions were lysed bluntly and with the electrocautery where indicated, taking care not to injure any adjacent organs or viscus. The infundibulum was grasped and retracted laterally, exposing the peritoneum overlying the triangle of Calot. This was then divided and exposed in a blunt fashion. A critical view of the cystic duct and cystic artery was obtained.  The cystic duct was clearly identified and bluntly dissected circumferentially.   The cystic duct was then ligated with clips and divided. The proximal portion was triple clipped.  The cystic artery was identified, dissected free, ligated with clips and divided as well.   The gallbladder was dissected from the liver bed in retrograde fashion with the electrocautery. The gallbladder was removed and placed in an Endocatch bag.  The gallbladder and Endocatch bag were then removed through the umbilical port site.  The liver bed was irrigated and inspected. Hemostasis was achieved with the electrocautery. Copious irrigation was utilized and was repeatedly aspirated until clear.    We again inspected the right upper quadrant for hemostasis.  Pneumoperitoneum was released as we removed the trocars.   The pursestring suture was used to close the umbilical fascia.  4-0 Monocryl was  used to close the skin.   The skin was cleaned  and dry, and Dermabond was applied. The patient was then extubated and brought to the recovery room in stable condition. Instrument, sponge, and needle counts were correct at closure and at the conclusion of the case.   Findings: Some chronic inflammation at infundibulum.  Enlarged node of Calot.    Estimated Blood Loss: min               Specimens: Gallbladder to pathology       Complications: None; patient tolerated the procedure well.         Disposition: PACU - hemodynamically stable.         Condition: stable

## 2015-06-29 NOTE — Discharge Instructions (Addendum)
[facility], [place] [phone]  ABDOMINAL SURGERY: POST OP INSTRUCTIONS  Always review your discharge instruction sheet given to you by the facility where your surgery was performed.  IF YOU HAVE DISABILITY OR FAMILY LEAVE FORMS, YOU MUST BRING THEM TO THE OFFICE FOR PROCESSING.  PLEASE DO NOT GIVE THEM TO YOUR DOCTOR.  1. A prescription for pain medication may be given to you upon discharge.  Take your pain medication as prescribed, if needed.  If narcotic pain medicine is not needed, then you may take acetaminophen (Tylenol) or ibuprofen (Advil) as needed. 2. Take your usually prescribed medications unless otherwise directed. 3. If you need a refill on your pain medication, please contact your pharmacy. They will contact our office to request authorization.  Prescriptions will not be filled after 5pm or on week-ends. 4. You should follow a light diet the first few days after arrival home, such as soup and crackers, pudding, etc.unless your doctor has advised otherwise. A high-fiber, low fat diet can be resumed as tolerated.   Be sure to include lots of fluids daily. Most patients will experience some swelling and bruising on the chest and neck area.  Ice packs will help.  Swelling and bruising can take several days to resolve 5. Most patients will experience some swelling and bruising in the area of the incision. Ice pack will help. Swelling and bruising can take several days to resolve..  6. It is common to experience some constipation if taking pain medication after surgery.  Increasing fluid intake and taking a stool softener will usually help or prevent this problem from occurring.  A mild laxative (Milk of Magnesia or Miralax) should be taken according to package directions if there are no bowel movements after 48 hours. 7.   You may shower in 48 hours.  The glue will flake off over the next 2-3 weeks.   You may find that a light gauze bandage over your incision may be more  comfortable.  8. ACTIVITIES:  You may resume regular (light) daily activities beginning the next day--such as daily self-care, walking, climbing stairs--gradually increasing activities as tolerated.  You may have sexual intercourse when it is comfortable.  Refrain from any heavy lifting or straining until approved by your doctor. a. You may drive when you no longer are taking prescription pain medication, you can comfortably wear a seatbelt, and you can safely maneuver your car and apply brakes b. Return to Work: __________1/2-2 weeks_________________________ 59. You should see your doctor in the office for a follow-up appointment approximately two weeks after your surgery.  Make sure that you call for this appointment within a day or two after you arrive home to insure a convenient appointment time. OTHER INSTRUCTIONS:  _____________________________________________________________ _____________________________________________________________  WHEN TO CALL YOUR DOCTOR: 1. Fever over 101.0 2. Inability to urinate 3. Nausea and/or vomiting 4. Extreme swelling or bruising 5. Continued bleeding from incision. 6. Increased pain, redness, or drainage from the incision. 7. Difficulty swallowing or breathing 8. Muscle cramping or spasms. 9. Numbness or tingling in hands or feet or around lips.  The clinic staff is available to answer your questions during regular business hours.  Please dont hesitate to call and ask to speak to one of the nurses if you have concerns.  For further questions, please visit [email or web address]    Post Anesthesia Home Care Instructions  Activity: Get plenty of rest for the remainder of the day. A responsible adult should stay with  you for 24 hours following the procedure.  For the next 24 hours, DO NOT: -Drive a car -Paediatric nurse -Drink alcoholic beverages -Take any medication unless instructed by your physician -Make any legal decisions or sign  important papers.  Meals: Start with liquid foods such as gelatin or soup. Progress to regular foods as tolerated. Avoid greasy, spicy, heavy foods. If nausea and/or vomiting occur, drink only clear liquids until the nausea and/or vomiting subsides. Call your physician if vomiting continues.  Special Instructions/Symptoms: Your throat may feel dry or sore from the anesthesia or the breathing tube placed in your throat during surgery. If this causes discomfort, gargle with warm salt water. The discomfort should disappear within 24 hours.  If you had a scopolamine patch placed behind your ear for the management of post- operative nausea and/or vomiting:  1. The medication in the patch is effective for 72 hours, after which it should be removed.  Wrap patch in a tissue and discard in the trash. Wash hands thoroughly with soap and water. 2. You may remove the patch earlier than 72 hours if you experience unpleasant side effects which may include dry mouth, dizziness or visual disturbances. 3. Avoid touching the patch. Wash your hands with soap and water after contact with the patch.

## 2015-06-30 ENCOUNTER — Encounter (HOSPITAL_BASED_OUTPATIENT_CLINIC_OR_DEPARTMENT_OTHER): Payer: Self-pay | Admitting: General Surgery

## 2015-07-25 ENCOUNTER — Other Ambulatory Visit: Payer: Self-pay | Admitting: General Surgery

## 2015-07-26 ENCOUNTER — Other Ambulatory Visit: Payer: Self-pay | Admitting: General Surgery

## 2015-07-26 DIAGNOSIS — R0789 Other chest pain: Secondary | ICD-10-CM

## 2015-08-01 ENCOUNTER — Ambulatory Visit
Admission: RE | Admit: 2015-08-01 | Discharge: 2015-08-01 | Disposition: A | Discharge status: Home / Self Care | Payer: BC Managed Care – PPO | Source: Ambulatory Visit | Attending: General Surgery | Admitting: General Surgery

## 2015-08-01 DIAGNOSIS — R0789 Other chest pain: Secondary | ICD-10-CM

## 2015-08-01 MED ORDER — IOPAMIDOL (ISOVUE-370) INJECTION 76%
100.0000 mL | Freq: Once | INTRAVENOUS | Status: AC | PRN
  Administered 2015-08-01: 100 mL via INTRAVENOUS

## 2015-10-10 ENCOUNTER — Other Ambulatory Visit: Payer: Self-pay | Admitting: Obstetrics & Gynecology

## 2015-10-10 DIAGNOSIS — R928 Other abnormal and inconclusive findings on diagnostic imaging of breast: Secondary | ICD-10-CM

## 2015-11-02 ENCOUNTER — Ambulatory Visit
Admission: RE | Admit: 2015-11-02 | Discharge: 2015-11-02 | Disposition: A | Discharge status: Home / Self Care | Payer: BC Managed Care – PPO | Source: Ambulatory Visit | Attending: Obstetrics & Gynecology | Admitting: Obstetrics & Gynecology

## 2015-11-02 DIAGNOSIS — N6489 Other specified disorders of breast: Secondary | ICD-10-CM | POA: Insufficient documentation

## 2015-11-02 DIAGNOSIS — R928 Other abnormal and inconclusive findings on diagnostic imaging of breast: Secondary | ICD-10-CM | POA: Diagnosis present

## 2015-11-09 DIAGNOSIS — G8929 Other chronic pain: Secondary | ICD-10-CM | POA: Insufficient documentation

## 2015-11-09 DIAGNOSIS — R1011 Right upper quadrant pain: Secondary | ICD-10-CM | POA: Insufficient documentation

## 2015-11-09 DIAGNOSIS — Q625 Duplication of ureter: Secondary | ICD-10-CM | POA: Insufficient documentation

## 2015-11-12 DIAGNOSIS — N3946 Mixed incontinence: Secondary | ICD-10-CM | POA: Insufficient documentation

## 2015-11-30 DIAGNOSIS — G8929 Other chronic pain: Secondary | ICD-10-CM | POA: Insufficient documentation

## 2015-11-30 DIAGNOSIS — M4125 Other idiopathic scoliosis, thoracolumbar region: Secondary | ICD-10-CM | POA: Insufficient documentation

## 2016-03-25 DIAGNOSIS — D6851 Activated protein C resistance: Secondary | ICD-10-CM

## 2016-03-25 HISTORY — DX: Activated protein C resistance: D68.51

## 2016-03-27 ENCOUNTER — Other Ambulatory Visit: Payer: Self-pay | Admitting: Obstetrics & Gynecology

## 2016-03-28 ENCOUNTER — Other Ambulatory Visit: Payer: Self-pay | Admitting: Obstetrics & Gynecology

## 2016-03-28 DIAGNOSIS — Z1239 Encounter for other screening for malignant neoplasm of breast: Secondary | ICD-10-CM

## 2016-03-28 DIAGNOSIS — N6489 Other specified disorders of breast: Secondary | ICD-10-CM

## 2016-05-03 ENCOUNTER — Other Ambulatory Visit: Payer: BC Managed Care – PPO

## 2016-05-03 ENCOUNTER — Ambulatory Visit: Payer: BC Managed Care – PPO

## 2016-05-09 ENCOUNTER — Ambulatory Visit
Admission: RE | Admit: 2016-05-09 | Discharge: 2016-05-09 | Disposition: A | Discharge status: Home / Self Care | Payer: BC Managed Care – PPO | Source: Ambulatory Visit | Attending: Obstetrics & Gynecology | Admitting: Obstetrics & Gynecology

## 2016-05-09 DIAGNOSIS — N6489 Other specified disorders of breast: Secondary | ICD-10-CM

## 2016-05-09 DIAGNOSIS — Z1239 Encounter for other screening for malignant neoplasm of breast: Secondary | ICD-10-CM

## 2016-05-20 ENCOUNTER — Emergency Department
Admission: EM | Admit: 2016-05-20 | Discharge: 2016-05-20 | Disposition: A | Discharge status: Home / Self Care | Payer: BC Managed Care – PPO | Attending: Student in an Organized Health Care Education/Training Program | Admitting: Student in an Organized Health Care Education/Training Program

## 2016-05-20 ENCOUNTER — Encounter: Payer: Self-pay | Admitting: Emergency Medicine

## 2016-05-20 DIAGNOSIS — Z79899 Other long term (current) drug therapy: Secondary | ICD-10-CM | POA: Insufficient documentation

## 2016-05-20 DIAGNOSIS — R51 Headache: Secondary | ICD-10-CM | POA: Diagnosis not present

## 2016-05-20 DIAGNOSIS — R519 Headache, unspecified: Secondary | ICD-10-CM

## 2016-05-20 HISTORY — DX: Migraine, unspecified, not intractable, without status migrainosus: G43.909

## 2016-05-20 MED ORDER — SODIUM CHLORIDE 0.9 % IV BOLUS (SEPSIS)
1000.0000 mL | Freq: Once | INTRAVENOUS | Status: AC
  Administered 2016-05-20: 1000 mL via INTRAVENOUS

## 2016-05-20 MED ORDER — MAGNESIUM SULFATE 2 GM/50ML IV SOLN
2.0000 g | Freq: Once | INTRAVENOUS | Status: AC
  Administered 2016-05-20: 2 g via INTRAVENOUS
  Filled 2016-05-20: qty 50

## 2016-05-20 MED ORDER — DEXAMETHASONE SODIUM PHOSPHATE 10 MG/ML IJ SOLN
10.0000 mg | Freq: Once | INTRAMUSCULAR | Status: AC
  Administered 2016-05-20: 10 mg via INTRAVENOUS
  Filled 2016-05-20: qty 1

## 2016-05-20 MED ORDER — ACETAMINOPHEN 500 MG PO TABS
1000.0000 mg | ORAL_TABLET | Freq: Once | ORAL | Status: AC
  Administered 2016-05-20: 1000 mg via ORAL
  Filled 2016-05-20: qty 2

## 2016-05-20 MED ORDER — DIPHENHYDRAMINE HCL 50 MG/ML IJ SOLN
25.0000 mg | Freq: Once | INTRAMUSCULAR | Status: AC
  Administered 2016-05-20: 25 mg via INTRAVENOUS
  Filled 2016-05-20: qty 1

## 2016-05-20 MED ORDER — PROCHLORPERAZINE EDISYLATE 5 MG/ML IJ SOLN
10.0000 mg | Freq: Once | INTRAMUSCULAR | Status: AC
  Administered 2016-05-20: 5 mg via INTRAVENOUS
  Filled 2016-05-20: qty 2

## 2016-05-20 NOTE — ED Provider Notes (Signed)
[facility] Emergency Department Provider Note    First MD Initiated Contact with Patient 05/20/16 1319     (approximate)  I have reviewed the triage vital signs and the nursing notes.   HISTORY  Chief Complaint Headache; Numbness; and Visual Field Change    HPI Pamela Mills is a 36 y.o. female with a history of anxiety depression and migraines presents with a right-sided migraine headache. Patient states that she initially had a visual aura around 10:00 this morning where she felt she was "crinkle in her vision." States that she then started having left-sided numbness that moved up her shoulder and her left face. This lasted roughly 30 minutes and then abated when she started developing a headache. States that she's had similar episodes in the past. States is identical to last time she had a migraine headache. States the last time she had a migraine headache however she was also confused and having difficulty speaking. Is not the worse headache of her life. States is gradually increased. No fevers. No nausea or vomiting. Is worse with bright lights.   Past Medical History:  Diagnosis Date  . Anxiety   . Depression   . Migraines    Family History  Problem Relation Age of Onset  . Ovarian cancer Mother 63  . Breast cancer Neg Hx    Past Surgical History:  Procedure Laterality Date  . CHOLECYSTECTOMY N/A 06/29/2015   Procedure: LAPAROSCOPIC CHOLECYSTECTOMY;  Surgeon: Stark Klein, MD;  Location: [facility];  Service: General;  Laterality: N/A;  . DILATION AND EVACUATION N/A 11/10/2014   Procedure: DILATATION AND EVACUATION;  Surgeon: Honor Loh Ward, MD;  Location: [facility];  Service: Gynecology;  Laterality: N/A;  . FOOT SURGERY Left 01/2012  . LAPAROSCOPIC ENDOMETRIOSIS FULGURATION  2006  . UPPER GI ENDOSCOPY     There are no active problems to display for this patient.     Prior to Admission medications   Medication Sig Start Date  End Date Taking? Authorizing Provider  escitalopram (LEXAPRO) 10 MG tablet Take 10 mg by mouth every evening.    Yes Historical Provider, MD  fluticasone (FLONASE) 50 MCG/ACT nasal spray Place into both nostrils as needed for allergies or rhinitis.   Yes Historical Provider, MD  cetirizine (ZYRTEC) 10 MG tablet Take 10 mg by mouth daily.    Historical Provider, MD  ibuprofen (ADVIL,MOTRIN) 600 MG tablet Take 1 tablet (600 mg total) by mouth every 6 (six) hours as needed. Patient not taking: Reported on 05/20/2016 11/10/14   Honor Loh Ward, MD  LORazepam (ATIVAN) 0.5 MG tablet Take 0.5 mg by mouth as needed for anxiety.    Historical Provider, MD  norgestimate-ethinyl estradiol (ORTHO-CYCLEN,SPRINTEC,PREVIFEM) 0.25-35 MG-MCG tablet Take 1 tablet by mouth daily.    Historical Provider, MD  oxyCODONE-acetaminophen (ROXICET) 5-325 MG tablet Take 1-2 tablets by mouth every 4 (four) hours as needed for severe pain. Patient not taking: Reported on 05/20/2016 06/29/15   Stark Klein, MD    Allergies Other    Social History Social History  Substance Use Topics  . Smoking status: Never Smoker  . Smokeless tobacco: Not on file  . Alcohol use Yes     Comment: occasional    Review of Systems Patient denies headaches, rhinorrhea, blurry vision, numbness, shortness of breath, chest pain, edema, cough, abdominal pain, nausea, vomiting, diarrhea, dysuria, fevers, rashes or hallucinations unless otherwise stated above in HPI. ____________________________________________   PHYSICAL EXAM:  VITAL SIGNS: Vitals:  05/20/16 1400 05/20/16 1538  BP: 105/70 104/62  Pulse: 65 68  Resp:  18  Temp:      Constitutional: Alert and oriented. Well appearing and in no acute distress. Eyes: Conjunctivae are normal. PERRL. EOMI. Head: Atraumatic. Nose: No congestion/rhinnorhea. Mouth/Throat: Mucous membranes are moist.  Oropharynx non-erythematous. Neck: No stridor. Painless ROM. No cervical spine tenderness  to palpation Hematological/Lymphatic/Immunilogical: No cervical lymphadenopathy. Cardiovascular: Normal rate, regular rhythm. Grossly normal heart sounds.  Good peripheral circulation. Respiratory: Normal respiratory effort.  No retractions. Lungs CTAB. Gastrointestinal: Soft and nontender. No distention. No abdominal bruits. No CVA tenderness. Musculoskeletal: No lower extremity tenderness nor edema.  No joint effusions. Neurologic:  CN- intact.  No facial droop, Normal FNF.  Normal heel to shin.  Sensation intact bilaterally. Normal speech and language. No gross focal neurologic deficits are appreciated. No gait instability. Skin:  Skin is warm, dry and intact. No rash noted. Psychiatric: Mood and affect are normal. Speech and behavior are normal.  ____________________________________________   LABS (all labs ordered are listed, but only abnormal results are displayed)  No results found for this or any previous visit (from the past 24 hour(s)). ____________________________________________ ____________________________________________  [id number]   ____________________________________________   PROCEDURES  Procedure(s) performed:  Procedures    Critical Care performed: no ____________________________________________   INITIAL IMPRESSION / ASSESSMENT AND PLAN / ED COURSE  Pertinent labs & imaging results that were available during my care of the patient were reviewed by me and considered in my medical decision making (see chart for details).  DDX: migraine, tesnion, cluster, sah, sdh, gca  Pamela Mills is a 36 y.o. who presents to the ED with with Hx of complex migraines p/w HA for several hours as described above. Not worst HA ever. Gradual onset. HA similar to previous episodes. Denies focal neurologic symptoms at this time as they resolved with onset of pain. Denies trauma. No fevers or neck pain. No vision loss. Afebrile in ED. VSS. Exam as above. No meningeal signs. No  CN, motor, sensory or cerebellar deficits. Temporal arteries palpable and non-tender. Appears well and non-toxic.  Will provide IV fluids for hydration and IV medications for symptom control.   Likely complex migraine HA as she has had them in the past.  Possible tension headache.. Clinical picture is not consistent with ICH, SAH, SDH, EDH, TIA, or CVA. No concern for meningitis or encephalitis. No concern for GCA/Temporal arteritis.    Clinical Course as of May 20 1550  Mon May 20, 2016  1518 Patient reassessed and states her headache is only 5 out of 10 in severity. Numbness and tingling has completely resolved. No visual deficits at this time. We'll give IV mag to further treat her headache.  [PR]    Clinical Course User Index [PR] Merlyn Lot, MD   ----------------------------------------- 3:51 PM on 05/20/2016 -----------------------------------------  Patient be signed out to Dr. Judithann Sheen pending reassessment and completion of her confusion.  Have discussed with the patient and available family all diagnostics and treatments performed thus far and all questions were answered to the best of my ability. The patient demonstrates understanding and agreement with plan.   ____________________________________________   FINAL CLINICAL IMPRESSION(S) / ED DIAGNOSES  Final diagnoses:  Acute nonintractable headache, unspecified headache type      NEW MEDICATIONS STARTED DURING THIS VISIT:  New Prescriptions   No medications on file     Note:  This document was prepared using Dragon voice recognition software and may include unintentional dictation  errors.    Merlyn Lot, MD 05/20/16 [id number]

## 2016-05-20 NOTE — Discharge Instructions (Signed)

## 2016-05-20 NOTE — ED Triage Notes (Signed)
Headache x 30 minutes. Was preceeded by visual "crinkle" started at 1050. L arm numbness began at 1130. States feels forgetful though is giving detailed history. Face symetrical. Grips equal though states L feels week to her. Legs equal strength. Gait steady. R sided headache. States has history of migraines with all of the above symptoms except the feeling of forgetfulness.

## 2016-06-14 ENCOUNTER — Other Ambulatory Visit: Payer: Self-pay | Admitting: Nurse Practitioner

## 2016-06-14 DIAGNOSIS — G43109 Migraine with aura, not intractable, without status migrainosus: Secondary | ICD-10-CM

## 2016-06-15 DIAGNOSIS — G43109 Migraine with aura, not intractable, without status migrainosus: Secondary | ICD-10-CM | POA: Insufficient documentation

## 2016-06-24 ENCOUNTER — Other Ambulatory Visit: Payer: Self-pay

## 2016-06-24 MED ORDER — NORGESTIMATE-ETH ESTRADIOL 0.25-35 MG-MCG PO TABS: 1.0000 | ORAL_TABLET | Freq: Every day | ORAL | 3 refills | Status: DC

## 2016-06-30 DIAGNOSIS — E538 Deficiency of other specified B group vitamins: Secondary | ICD-10-CM | POA: Insufficient documentation

## 2016-07-01 DIAGNOSIS — E538 Deficiency of other specified B group vitamins: Secondary | ICD-10-CM | POA: Insufficient documentation

## 2016-07-01 DIAGNOSIS — K900 Celiac disease: Secondary | ICD-10-CM | POA: Insufficient documentation

## 2016-07-01 DIAGNOSIS — E559 Vitamin D deficiency, unspecified: Secondary | ICD-10-CM | POA: Insufficient documentation

## 2016-07-13 ENCOUNTER — Ambulatory Visit
Admission: RE | Admit: 2016-07-13 | Discharge: 2016-07-13 | Disposition: A | Discharge status: Home / Self Care | Payer: BC Managed Care – PPO | Source: Ambulatory Visit | Attending: Nurse Practitioner | Admitting: Nurse Practitioner

## 2016-07-13 DIAGNOSIS — G43109 Migraine with aura, not intractable, without status migrainosus: Secondary | ICD-10-CM | POA: Diagnosis not present

## 2016-07-13 MED ORDER — GADOBENATE DIMEGLUMINE 529 MG/ML IV SOLN
20.0000 mL | Freq: Once | INTRAVENOUS | Status: AC | PRN
  Administered 2016-07-13: 20 mL via INTRAVENOUS

## 2016-07-25 ENCOUNTER — Other Ambulatory Visit: Payer: Self-pay | Admitting: Internal Medicine

## 2016-07-25 DIAGNOSIS — R9389 Abnormal findings on diagnostic imaging of other specified body structures: Secondary | ICD-10-CM

## 2016-07-30 ENCOUNTER — Ambulatory Visit
Admission: RE | Admit: 2016-07-30 | Discharge: 2016-07-30 | Disposition: A | Discharge status: Home / Self Care | Payer: BC Managed Care – PPO | Source: Ambulatory Visit | Attending: Internal Medicine | Admitting: Internal Medicine

## 2016-07-30 DIAGNOSIS — R9389 Abnormal findings on diagnostic imaging of other specified body structures: Secondary | ICD-10-CM

## 2016-07-30 DIAGNOSIS — R938 Abnormal findings on diagnostic imaging of other specified body structures: Secondary | ICD-10-CM | POA: Insufficient documentation

## 2016-08-02 DIAGNOSIS — R0609 Other forms of dyspnea: Secondary | ICD-10-CM | POA: Insufficient documentation

## 2016-08-08 DIAGNOSIS — R918 Other nonspecific abnormal finding of lung field: Secondary | ICD-10-CM | POA: Insufficient documentation

## 2016-08-15 DIAGNOSIS — Z79899 Other long term (current) drug therapy: Secondary | ICD-10-CM | POA: Insufficient documentation

## 2016-09-06 ENCOUNTER — Other Ambulatory Visit: Payer: Self-pay | Admitting: Specialist

## 2016-09-06 DIAGNOSIS — R918 Other nonspecific abnormal finding of lung field: Secondary | ICD-10-CM

## 2016-09-17 ENCOUNTER — Telehealth: Payer: Self-pay | Admitting: Obstetrics & Gynecology

## 2016-09-17 NOTE — Telephone Encounter (Signed)
[facility] Faxed an medication request that is not Authorized due to Dr. Leonides Schanz no longer with [facility]. Pt needs an Appt.

## 2016-11-26 ENCOUNTER — Ambulatory Visit
Admission: RE | Admit: 2016-11-26 | Discharge: 2016-11-26 | Disposition: A | Discharge status: Home / Self Care | Payer: BC Managed Care – PPO | Source: Ambulatory Visit | Attending: Specialist | Admitting: Specialist

## 2016-11-26 ENCOUNTER — Ambulatory Visit: Payer: BC Managed Care – PPO

## 2016-11-26 DIAGNOSIS — I700 Atherosclerosis of aorta: Secondary | ICD-10-CM | POA: Insufficient documentation

## 2016-11-26 DIAGNOSIS — R918 Other nonspecific abnormal finding of lung field: Secondary | ICD-10-CM | POA: Diagnosis present

## 2016-11-28 ENCOUNTER — Ambulatory Visit
Admission: RE | Admit: 2016-11-28 | Discharge: 2016-11-28 | Disposition: A | Discharge status: Home / Self Care | Payer: BC Managed Care – PPO | Source: Ambulatory Visit | Attending: Specialist | Admitting: Specialist

## 2016-11-28 ENCOUNTER — Other Ambulatory Visit: Payer: Self-pay | Admitting: Specialist

## 2016-11-28 ENCOUNTER — Other Ambulatory Visit
Admission: RE | Admit: 2016-11-28 | Discharge: 2016-11-28 | Disposition: A | Discharge status: Home / Self Care | Payer: BC Managed Care – PPO | Source: Ambulatory Visit | Attending: Specialist | Admitting: Specialist

## 2016-11-28 DIAGNOSIS — R0609 Other forms of dyspnea: Principal | ICD-10-CM

## 2016-11-28 DIAGNOSIS — R918 Other nonspecific abnormal finding of lung field: Secondary | ICD-10-CM | POA: Insufficient documentation

## 2016-11-28 DIAGNOSIS — M79661 Pain in right lower leg: Secondary | ICD-10-CM

## 2016-11-28 LAB — FIBRIN DERIVATIVES D-DIMER (ARMC ONLY): Fibrin derivatives D-dimer (ARMC): 2605.96 — ABNORMAL HIGH (ref 0.00–499.00)

## 2016-11-29 ENCOUNTER — Ambulatory Visit
Admission: RE | Admit: 2016-11-29 | Discharge: 2016-11-29 | Disposition: A | Discharge status: Home / Self Care | Payer: BC Managed Care – PPO | Source: Ambulatory Visit | Attending: Specialist | Admitting: Specialist

## 2016-11-29 ENCOUNTER — Other Ambulatory Visit: Payer: Self-pay | Admitting: Specialist

## 2016-11-29 DIAGNOSIS — J90 Pleural effusion, not elsewhere classified: Secondary | ICD-10-CM | POA: Insufficient documentation

## 2016-11-29 DIAGNOSIS — R7989 Other specified abnormal findings of blood chemistry: Secondary | ICD-10-CM

## 2016-11-29 DIAGNOSIS — R0609 Other forms of dyspnea: Secondary | ICD-10-CM | POA: Insufficient documentation

## 2016-11-29 DIAGNOSIS — I2699 Other pulmonary embolism without acute cor pulmonale: Secondary | ICD-10-CM | POA: Insufficient documentation

## 2016-11-29 MED ORDER — IOPAMIDOL (ISOVUE-370) INJECTION 76%
75.0000 mL | Freq: Once | INTRAVENOUS | Status: AC | PRN
  Administered 2016-11-29: 75 mL via INTRAVENOUS

## 2016-12-10 DIAGNOSIS — Z86711 Personal history of pulmonary embolism: Secondary | ICD-10-CM | POA: Insufficient documentation

## 2016-12-11 ENCOUNTER — Encounter: Payer: Self-pay | Admitting: Oncology

## 2016-12-11 ENCOUNTER — Inpatient Hospital Stay: Payer: BC Managed Care – PPO | Attending: Oncology | Admitting: Oncology

## 2016-12-11 VITALS — BP 118/79 | HR 75 | Temp 98.2°F | Resp 18 | Ht 67.0 in | Wt 225.4 lb

## 2016-12-11 DIAGNOSIS — D6851 Activated protein C resistance: Secondary | ICD-10-CM

## 2016-12-11 DIAGNOSIS — J90 Pleural effusion, not elsewhere classified: Secondary | ICD-10-CM | POA: Diagnosis not present

## 2016-12-11 DIAGNOSIS — Z800 Family history of malignant neoplasm of digestive organs: Secondary | ICD-10-CM | POA: Diagnosis not present

## 2016-12-11 DIAGNOSIS — R590 Localized enlarged lymph nodes: Secondary | ICD-10-CM

## 2016-12-11 DIAGNOSIS — Z7901 Long term (current) use of anticoagulants: Secondary | ICD-10-CM

## 2016-12-11 DIAGNOSIS — I2699 Other pulmonary embolism without acute cor pulmonale: Secondary | ICD-10-CM | POA: Diagnosis not present

## 2016-12-11 DIAGNOSIS — M0579 Rheumatoid arthritis with rheumatoid factor of multiple sites without organ or systems involvement: Secondary | ICD-10-CM

## 2016-12-11 DIAGNOSIS — R0609 Other forms of dyspnea: Secondary | ICD-10-CM

## 2016-12-11 DIAGNOSIS — R600 Localized edema: Secondary | ICD-10-CM | POA: Diagnosis not present

## 2016-12-11 DIAGNOSIS — Z79899 Other long term (current) drug therapy: Secondary | ICD-10-CM

## 2016-12-11 DIAGNOSIS — R042 Hemoptysis: Secondary | ICD-10-CM

## 2016-12-11 DIAGNOSIS — Z8041 Family history of malignant neoplasm of ovary: Secondary | ICD-10-CM

## 2016-12-11 DIAGNOSIS — R0789 Other chest pain: Secondary | ICD-10-CM

## 2016-12-11 DIAGNOSIS — R05 Cough: Secondary | ICD-10-CM

## 2016-12-11 DIAGNOSIS — F418 Other specified anxiety disorders: Secondary | ICD-10-CM

## 2016-12-11 NOTE — Progress Notes (Signed)
Hematology/Oncology Consult note [facility] [phone] [phone]  CONSULT NOTE Patient Care Team: Adin Hector, MD as PCP - General (Internal Medicine)  CHIEF COMPLAINTS/PURPOSE OF CONSULTATION:  PE and low protein S level  Referring Physician: Dr.Fleming Herbon.  HISTORY OF PRESENTING ILLNESS:  Pamela Mills 36 y.o.  female with past medical history listed as below who was referred by Dr. Raul Del to me for evaluation and management of low protein S activity.  November 2017, patient started to experience persistent dry cough, exertional shortness of breath, progressive swelling in hands and feet, she was referred to see rheumatology. Workup showed seropositive rheumatoid arthritis, Negative anti-CCP but elevated RF Patient was  Prednisone and later started on methotrexate weekly (started on 08/15/2016) for treatment.. Patient's respiratory symptoms improved and then worsen again. At that point she was referred by her primary care to see Dr. Raul Del pulmonary for evaluation. CT without contrast on 07/30/2016 showed basilar and apparent from predominant ground glass opacities in the architectural distortion, with possible minimum lower lobe bronchiectasis suggesting interstitial lung disease. There is also mild the bilateral axillary, subpectoral, supraclavicular, mediastinal adenopathy.lymph nodes are increased in number, mildly enlarged. Largest left axillary measuring 11 mm short axis. Findings favor reactive/systemic process with autoimmune disorders.  Patient's rheumatology symptoms improved with treatment. Patient had wax and wane of her respiratory symptoms. In the summer, she has had 2 round trips of 8 hour car ride back and forth to [place] where her father lives. Most recent one was on August 5. She has felt lower extremity swelling for which she has to put on stocking to reduce swelling. Also she felt a little stretchy feeling on her cough. She  mentioned to Dr. Raul Del who ordered a follow-up CT scan. CT without contrast chest on 11/26/2016 showed patchy groundglass in the septal thickening in both lower lobes, less severe than 07/27/2016. There is new rounded consolidation in the left lower lobe, possible infectious etiology. No definitive evidence of interstitial lung disease. Meanwhile her respiratory symptoms has become worse. She has experienced coughing, exertional shortness of breath, and mild hemoptysis. This triggered a d-dimer testing which came back high. Patient had CT anginal chest PE protocol on 11/29/2016 which showed acute appearing nonconclusive pulmonary emboli within several segmental pulmonary artery branches to the left lower lobe. Small pleural-based consolidation with the posterior aspect of the left lower lobe presumably associated focal pulmonary infarction. Associated small left pleural effusion. No evidence of associated right heart straining. Questionable focal low-density pulmonary embolus within the segmental pulmonary artery branch to the right middle lobe.   lower extremity ultrasound revealed no DVT within the right lower extremity. Patient was started on Eliquis 5 mg twice a day.  Patient has had some rheumatology/hypercoagulable workup done with [facility]. Reviewing her labs showed on 08/08/2016, she is tested negative for anti-Cardiolite pain antibody IgG < 9, IgM slightly elevated at 15. Beta-2 glycoprotein IgG and IgM normal, normal axial coronal phase phospholipid, DVvT normal, negative lupus anticoagulant.. Serum protein electrophoresis negative for M spike. Onto ferritin negative. 11/29/2016, homocystine 8, lupus anticoagulant was retested was negative, mildly low protein S activity at 48, normal protein S antigen. Slightly low protein S antigen. Normal anti-thrombin function. She is tested positive for single R506Q mutation heterozygous factor V Leiden mutation.  Patient has a family history of  ovarian cancer in her mom. She reports that she was tested negative for genetic test.  Today patient presented to the clinic. She appears  very anxious and emotional. She takes OCP since she was teenage.  This is her fist episode of blood clot. She feels very frustrated with her rheumatology problems now she has to deal with blood thinners.   ROS:  Review of Systems  Constitutional: Negative.   HENT:  Negative.   Eyes: Negative.   Respiratory: Positive for chest tightness and cough.   Cardiovascular: Negative.   Gastrointestinal: Negative.   Endocrine: Negative.   Genitourinary: Negative.    Musculoskeletal: Negative.   Skin: Negative.   Neurological: Negative.   Hematological: Negative.   Psychiatric/Behavioral: The patient is nervous/anxious.     MEDICAL HISTORY:  Past Medical History:  Diagnosis Date  . Anxiety   . Arthritis   . Depression   . Migraines     SURGICAL HISTORY: Past Surgical History:  Procedure Laterality Date  . CHOLECYSTECTOMY N/A 06/29/2015   Procedure: LAPAROSCOPIC CHOLECYSTECTOMY;  Surgeon: Stark Klein, MD;  Location: [facility];  Service: General;  Laterality: N/A;  . DILATION AND EVACUATION N/A 11/10/2014   Procedure: DILATATION AND EVACUATION;  Surgeon: Honor Loh Ward, MD;  Location: [facility];  Service: Gynecology;  Laterality: N/A;  . FOOT SURGERY Left 01/2012  . LAPAROSCOPIC ENDOMETRIOSIS FULGURATION  2006  . UPPER GI ENDOSCOPY      SOCIAL HISTORY: Social History   Social History  . Marital status: Married    Spouse name: N/A  . Number of children: N/A  . Years of education: N/A   Occupational History  . Not on file.   Social History Main Topics  . Smoking status: Never Smoker  . Smokeless tobacco: Never Used  . Alcohol use No  . Drug use: No  . Sexual activity: Not Currently   Other Topics Concern  . Not on file   Social History Narrative  . No narrative on file    FAMILY HISTORY: Family History  Problem  Relation Age of Onset  . Ovarian cancer Mother 25  . Rheumatologic disease Maternal Aunt   . Pancreatic cancer Maternal Grandfather   . Colon cancer Paternal Grandmother   . Breast cancer Neg Hx     ALLERGIES:  is allergic to other and gluten meal.  MEDICATIONS:  Current Outpatient Prescriptions  Medication Sig Dispense Refill  . apixaban (ELIQUIS) 5 MG TABS tablet Take 5 mg by mouth 2 (two) times daily.    . cetirizine (ZYRTEC) 10 MG tablet Take 10 mg by mouth daily.    . Cholecalciferol (VITAMIN D3) 1000 units CAPS Take 1,000 Units by mouth daily.    Marland Kitchen escitalopram (LEXAPRO) 10 MG tablet Take 10 mg by mouth every evening.     . fluticasone (FLONASE) 50 MCG/ACT nasal spray Place into both nostrils as needed for allergies or rhinitis.    . folic acid (FOLVITE) 1 MG tablet Take 1 mg by mouth daily.    Marland Kitchen LORazepam (ATIVAN) 0.5 MG tablet Take 0.5 mg by mouth as needed for anxiety.    . Methotrexate, PF, 25 MG/0.5ML SOAJ Inject 25 mg into the skin once a week.    . norgestimate-ethinyl estradiol (ORTHO-CYCLEN,SPRINTEC,PREVIFEM) 0.25-35 MG-MCG tablet Take 1 tablet by mouth daily. 1 Package 3  . OMEPRAZOLE PO Take 20 mg by mouth daily.    . predniSONE (DELTASONE) 2.5 MG tablet Take 2.5 mg by mouth daily.    . SUMAtriptan (IMITREX) 50 MG tablet Take 50 mg by mouth as needed.    . topiramate (TOPAMAX) 100 MG tablet Take 100 mg by  mouth at bedtime.    . vitamin B-12 (CYANOCOBALAMIN) 1000 MCG tablet Take 1,000 mcg by mouth daily.     No current facility-administered medications for this visit.       Marland Kitchen  PHYSICAL EXAMINATION: ECOG PERFORMANCE STATUS: 0 - Asymptomatic Vitals:   12/11/16 1407  BP: 118/79  Pulse: 75  Resp: 18  Temp: 98.2 F (36.8 C)   Filed Weights   12/11/16 1407  Weight: 225 lb 7 oz (102.3 kg)    GENERAL:Alert, no distress and comfortable.  EYES: no pallor or icterus OROPHARYNX: no thrush or ulceration; good dentition  NECK: supple, no masses felt LYMPH:  no  palpable lymphadenopathy in the cervical, axillary or inguinal regions LUNGS: clear to auscultation and  No wheeze or crackles HEART/CVS: regular rate & rhythm and no murmurs; No lower extremity edema ABDOMEN: abdomen soft, non-tender and normal bowel sounds Musculoskeletal:no cyanosis of digits and no clubbing  PSYCH: alert & oriented x 3  NEURO: no focal motor/sensory deficits SKIN:  no rashes or significant lesions  LABORATORY DATA:  I have reviewed the data as listed Patient has had some rheumatology/hypercoagulable workup done with [facility]. Reviewing her labs showed on 08/08/2016, she is tested negative for anti-Cardiolite pain antibody IgG < 9, IgM slightly elevated at 15. Beta-2 glycoprotein IgG and IgM normal, normal axial coronal phase phospholipid, DVvT normal, negative lupus anticoagulant.. Serum protein electrophoresis negative for M spike. Onto ferritin negative. 11/29/2016, homocystine 8, lupus anticoagulant was retested was negative, mildly low protein S activity at 48, normal protein S antigen. Slightly low protein S antigen. Normal anti-thrombin function. She is tested positive for single R506Q mutation heterozygous factor V Leiden mutation.  RADIOGRAPHIC STUDIES: I have personally reviewed the radiological images as listed and agreed with the findings in the report. CT without contrast on 07/30/2016 showed basilar and apparent from predominant ground glass opacities in the architectural distortion, with possible minimum lower lobe bronchiectasis suggesting interstitial lung disease. There is also mild the bilateral axillary, subpectoral, supraclavicular, mediastinal adenopathy.lymph nodes are increased in number, mildly enlarged. Largest left axillary measuring 11 mm short axis. Findings favor reactive/systemic process with autoimmune disorders.   Patient had CT anginal chest PE protocol on 11/29/2016 which showed acute appearing nonconclusive pulmonary emboli within  several segmental pulmonary artery branches to the left lower lobe. Small pleural-based consolidation with the posterior aspect of the left lower lobe presumably associated focal pulmonary infarction. Associated small left pleural effusion. No evidence of associated right heart straining. Questionable focal low-density pulmonary embolus within the segmental pulmonary artery branch to the right middle lobe.   ASSESSMENT & PLAN:  1. Other acute pulmonary embolism without acute cor pulmonale ([place])   2. Heterozygous factor V Leiden mutation ([place])   3. Chronic anticoagulation   4. Rheumatoid arthritis involving multiple sites with positive rheumatoid factor (HCC)   5  Use of oral contraceptives   # Likely provoked PE from combination of prolonged car ride trip and use of OCP. Advise stop OCP and follow up with gyn for alternative options.   # Recommend duration of anticoagulation to be 6 months.  # Continue Eliquis 5mg  BID.  # Heterozygous Factor V leiden mutation: discussed with patient that factor V leiden heterozygous mutation increase first episode blood clot risk, however, does not increase risk of recurrent blood clots. Discuss about need of DVT prophylaxis if she anticipate any upcoming long car trip, surgery, pregnancy after she finished 6 months of . anticoagulation  # Decreased  protein S activity; likely falsely low in acute setting. Can recheck after she completes 6 month of anticoagulation.   # patient reports that her sister who lives in [place] also is on OCP. Suggest patient to relay infomration to sister so that sister can be checked for factor V leiden mutation status.   All questions were answered. The patient knows to call the clinic with any problems questions or concerns. Total face to face encounter time for this patient visit was 60 min. >50% of the time was  spent in counseling and coordination of care.  Return of visit: 1 month  Thank you for this kind referral and the  opportunity to participate in the care of this patient. A copy of today's note is routed to referring provider    Earlie Server, MD, PhD Hematology Oncology [facility] Pager- [phone] 12/11/2016

## 2016-12-11 NOTE — Progress Notes (Signed)
Patient here today for follow up.   

## 2016-12-12 DIAGNOSIS — I2699 Other pulmonary embolism without acute cor pulmonale: Secondary | ICD-10-CM | POA: Insufficient documentation

## 2016-12-12 DIAGNOSIS — M0579 Rheumatoid arthritis with rheumatoid factor of multiple sites without organ or systems involvement: Secondary | ICD-10-CM | POA: Insufficient documentation

## 2016-12-12 DIAGNOSIS — Z7901 Long term (current) use of anticoagulants: Secondary | ICD-10-CM | POA: Insufficient documentation

## 2016-12-12 DIAGNOSIS — D6851 Activated protein C resistance: Secondary | ICD-10-CM | POA: Insufficient documentation

## 2017-01-08 ENCOUNTER — Inpatient Hospital Stay: Payer: BC Managed Care – PPO | Attending: Oncology | Admitting: Oncology

## 2017-01-08 ENCOUNTER — Encounter: Payer: Self-pay | Admitting: Oncology

## 2017-01-08 VITALS — BP 122/85 | HR 73 | Temp 98.1°F | Resp 18 | Ht 67.0 in | Wt 222.8 lb

## 2017-01-08 DIAGNOSIS — R509 Fever, unspecified: Secondary | ICD-10-CM | POA: Diagnosis not present

## 2017-01-08 DIAGNOSIS — Z800 Family history of malignant neoplasm of digestive organs: Secondary | ICD-10-CM | POA: Insufficient documentation

## 2017-01-08 DIAGNOSIS — M1990 Unspecified osteoarthritis, unspecified site: Secondary | ICD-10-CM | POA: Diagnosis not present

## 2017-01-08 DIAGNOSIS — K5790 Diverticulosis of intestine, part unspecified, without perforation or abscess without bleeding: Secondary | ICD-10-CM

## 2017-01-08 DIAGNOSIS — K921 Melena: Secondary | ICD-10-CM | POA: Insufficient documentation

## 2017-01-08 DIAGNOSIS — Z79899 Other long term (current) drug therapy: Secondary | ICD-10-CM | POA: Diagnosis not present

## 2017-01-08 DIAGNOSIS — I2699 Other pulmonary embolism without acute cor pulmonale: Secondary | ICD-10-CM | POA: Diagnosis not present

## 2017-01-08 DIAGNOSIS — R590 Localized enlarged lymph nodes: Secondary | ICD-10-CM | POA: Diagnosis not present

## 2017-01-08 DIAGNOSIS — Z8041 Family history of malignant neoplasm of ovary: Secondary | ICD-10-CM | POA: Diagnosis not present

## 2017-01-08 DIAGNOSIS — Z7952 Long term (current) use of systemic steroids: Secondary | ICD-10-CM | POA: Insufficient documentation

## 2017-01-08 DIAGNOSIS — R5383 Other fatigue: Secondary | ICD-10-CM | POA: Diagnosis not present

## 2017-01-08 DIAGNOSIS — F418 Other specified anxiety disorders: Secondary | ICD-10-CM | POA: Insufficient documentation

## 2017-01-08 DIAGNOSIS — R1032 Left lower quadrant pain: Secondary | ICD-10-CM

## 2017-01-08 DIAGNOSIS — Z7901 Long term (current) use of anticoagulants: Secondary | ICD-10-CM | POA: Insufficient documentation

## 2017-01-08 DIAGNOSIS — D6851 Activated protein C resistance: Secondary | ICD-10-CM | POA: Insufficient documentation

## 2017-01-08 DIAGNOSIS — M0579 Rheumatoid arthritis with rheumatoid factor of multiple sites without organ or systems involvement: Secondary | ICD-10-CM

## 2017-01-08 NOTE — Progress Notes (Signed)
Pt in lots of pain today because of the diverticulitis.

## 2017-01-08 NOTE — Progress Notes (Signed)
Hematology/Oncology follow-up [facility]  Patient Care Team: Adin Hector, MD as PCP - General (Internal Medicine)  Reason for visit Follow-up for PE and low protein S level  Referring Physician: Dr.Fleming Herbon.  HISTORY OF PRESENTING ILLNESS:  Pamela Mills 36 y.o.  female with past medical history listed as below who  follow-up for management of anticoagulation due to recent PE. Since  November 2017, patient started to experience persistent dry cough, exertional shortness of breath, progressive swelling in hands and feet, she was referred to see rheumatology. Workup showed seropositive rheumatoid arthritis, Negative anti-CCP but elevated RF Patient was  Prednisone and later started on methotrexate weekly (started on 08/15/2016) for treatment.. Patient's respiratory symptoms improved and then worsen again. At that point she was referred by her primary care to see Dr. Raul Del pulmonary for evaluation. CT without contrast on 07/30/2016 showed basilar and apparent from predominant ground glass opacities in the architectural distortion, with possible minimum lower lobe bronchiectasis suggesting interstitial lung disease. There is also mild the bilateral axillary, subpectoral, supraclavicular, mediastinal adenopathy.lymph nodes are increased in number, mildly enlarged. Largest left axillary measuring 11 mm short axis. Findings favor reactive/systemic process with autoimmune disorders.  Patient's rheumatology symptoms improved with treatment. Patient had wax and wane of her respiratory symptoms. In the summer, she has had 2 round trips of 8 hour car ride back and forth to [place] where her father lives. Most recent one was on August 5. She has felt lower extremity swelling for which she has to put on stocking to reduce swelling. Also she felt a little stretchy feeling on her cough. She mentioned to Dr. Raul Del who ordered a follow-up CT scan. CT without contrast chest on  11/26/2016 showed patchy groundglass in the septal thickening in both lower lobes, less severe than 07/27/2016. There is new rounded consolidation in the left lower lobe, possible infectious etiology. No definitive evidence of interstitial lung disease. Meanwhile her respiratory symptoms has become worse. She has experienced coughing, exertional shortness of breath, and mild hemoptysis. This triggered a d-dimer testing which came back high. Patient had CT anginal chest PE protocol on 11/29/2016 which showed acute appearing nonconclusive pulmonary emboli within several segmental pulmonary artery branches to the left lower lobe. Small pleural-based consolidation with the posterior aspect of the left lower lobe presumably associated focal pulmonary infarction. Associated small left pleural effusion. No evidence of associated right heart straining. Questionable focal low-density pulmonary embolus within the segmental pulmonary artery branch to the right middle lobe.   lower extremity ultrasound revealed no DVT within the right lower extremity. Patient was started on Eliquis 5 mg twice a day.  Patient has had some rheumatology/hypercoagulable workup done with [facility]. Reviewing her labs showed on 08/08/2016, she is tested negative for anti-Cardiolite pain antibody IgG < 9, IgM slightly elevated at 15. Beta-2 glycoprotein IgG and IgM normal, normal axial coronal phase phospholipid, DVvT normal, negative lupus anticoagulant.. Serum protein electrophoresis negative for M spike. Onto ferritin negative. 11/29/2016, homocystine 8, lupus anticoagulant was retested was negative, mildly low protein S activity at 48, normal protein S antigen. Slightly low protein S antigen. Normal anti-thrombin function. She is tested positive for single R506Q mutation heterozygous factor V Leiden mutation.  Patient has a family history of ovarian cancer in her mom. She reports that she was tested negative for genetic test.    Today patient presented to the clinic. She appears very anxious and emotional. She takes OCP since she was teenage.  This is her fist episode of blood clot. She feels very frustrated with her rheumatology problems now she has to deal with blood thinners.   Patient continues to take Eliquis 5 mg twice a day, and has recently gotten a refill. She tolerated treatment well without any bleeding until recently, she developed left lower quadrant abdominal pain, fever and she went to urgent care and was diagnosed for acute diverticulitis. She was started on antibiotics, and then she developed loose stools. She is not feeling well, and due to being on antibiotics, she hasn't been on methotrexate for the past week. She does not have any additional fever episodes.   ROS:  Review of Systems  Constitutional: Positive for fatigue and fever.  HENT:  Negative.  Negative for nosebleeds and sore throat.   Eyes: Negative.  Negative for icterus.  Respiratory: Negative for chest tightness.   Cardiovascular: Negative.  Negative for chest pain, leg swelling and palpitations.  Gastrointestinal: Positive for abdominal pain and blood in stool. Negative for vomiting.  Endocrine: Negative.  Negative for hot flashes.  Genitourinary: Negative.  Negative for difficulty urinating.   Musculoskeletal: Negative.  Negative for myalgias and neck pain.  Skin: Negative.  Negative for itching and rash.  Neurological: Negative.  Negative for dizziness.  Hematological: Negative.  Negative for adenopathy.  Psychiatric/Behavioral: Negative for confusion. The patient is nervous/anxious.     MEDICAL HISTORY:  Past Medical History:  Diagnosis Date  . Anxiety   . Arthritis   . Depression   . Migraines     SURGICAL HISTORY: Past Surgical History:  Procedure Laterality Date  . CHOLECYSTECTOMY N/A 06/29/2015   Procedure: LAPAROSCOPIC CHOLECYSTECTOMY;  Surgeon: Stark Klein, MD;  Location: [facility];  Service:  General;  Laterality: N/A;  . DILATION AND EVACUATION N/A 11/10/2014   Procedure: DILATATION AND EVACUATION;  Surgeon: Honor Loh Ward, MD;  Location: [facility];  Service: Gynecology;  Laterality: N/A;  . FOOT SURGERY Left 01/2012  . LAPAROSCOPIC ENDOMETRIOSIS FULGURATION  2006  . UPPER GI ENDOSCOPY      SOCIAL HISTORY: Social History   Social History  . Marital status: Married    Spouse name: N/A  . Number of children: N/A  . Years of education: N/A   Occupational History  . Not on file.   Social History Main Topics  . Smoking status: Never Smoker  . Smokeless tobacco: Never Used  . Alcohol use No  . Drug use: No  . Sexual activity: Not Currently   Other Topics Concern  . Not on file   Social History Narrative  . No narrative on file    FAMILY HISTORY: Family History  Problem Relation Age of Onset  . Ovarian cancer Mother 74  . Rheumatologic disease Maternal Aunt   . Pancreatic cancer Maternal Grandfather   . Colon cancer Paternal Grandmother   . Breast cancer Neg Hx     ALLERGIES:  is allergic to other and gluten meal.  MEDICATIONS:  Current Outpatient Prescriptions  Medication Sig Dispense Refill  . apixaban (ELIQUIS) 5 MG TABS tablet Take 5 mg by mouth 2 (two) times daily.    . cetirizine (ZYRTEC) 10 MG tablet Take 10 mg by mouth daily.    . Cholecalciferol (VITAMIN D3) 1000 units CAPS Take 1,000 Units by mouth daily.    Marland Kitchen escitalopram (LEXAPRO) 10 MG tablet Take 10 mg by mouth every evening.     . fluticasone (FLONASE) 50 MCG/ACT nasal spray Place into both nostrils as  needed for allergies or rhinitis.    . folic acid (FOLVITE) 1 MG tablet Take 1 mg by mouth daily.    Marland Kitchen LORazepam (ATIVAN) 0.5 MG tablet Take 0.5 mg by mouth as needed for anxiety.    . Methotrexate, PF, 25 MG/0.5ML SOAJ Inject 25 mg into the skin once a week.    . norgestimate-ethinyl estradiol (ORTHO-CYCLEN,SPRINTEC,PREVIFEM) 0.25-35 MG-MCG tablet Take 1 tablet by mouth daily. 1 Package 3  .  OMEPRAZOLE PO Take 20 mg by mouth daily.    . predniSONE (DELTASONE) 2.5 MG tablet Take 2.5 mg by mouth daily.    . SUMAtriptan (IMITREX) 50 MG tablet Take 50 mg by mouth as needed.    . topiramate (TOPAMAX) 100 MG tablet Take 100 mg by mouth at bedtime.    . vitamin B-12 (CYANOCOBALAMIN) 1000 MCG tablet Take 1,000 mcg by mouth daily.     No current facility-administered medications for this visit.       Marland Kitchen  PHYSICAL EXAMINATION: ECOG PERFORMANCE STATUS: 1 - Symptomatic but completely ambulatory Vitals:   01/08/17 1547  BP: 122/85  Pulse: 73  Resp: 18  Temp: 98.1 F (36.7 C)   Filed Weights   01/08/17 1547  Weight: 222 lb 12.8 oz (101.1 kg)    GENERAL: No distress, well nourished.  SKIN:  No rashes or significant lesions  HEAD: Normocephalic, No masses, lesions, tenderness or abnormalities  EYES: Conjunctiva are pink, non icteric ENT: External ears normal ,lips , buccal mucosa, and tongue normal and mucous membranes are moist  LYMPH: No palpable cervical and axillary lymphadenopathy  LUNGS: Clear to auscultation, no crackles or wheezes HEART: Regular rate & rhythm, no murmurs, no gallops, S1 normal and S2 normal  ABDOMEN: Abdomen soft, left lower quadrant tenderness with palpation  MUSCULOSKELETAL: No CVA tenderness and no tenderness on percussion of the back or rib cage.  EXTREMITIES: No edema, no skin discoloration or tenderness NEURO: Alert & oriented, no focal motor/sensory deficits.   LABORATORY DATA:  I have reviewed the data as listed Patient has had some rheumatology/hypercoagulable workup done with [facility]. Reviewing her labs showed on 08/08/2016, she is tested negative for anti-Cardiolite pain antibody IgG < 9, IgM slightly elevated at 15. Beta-2 glycoprotein IgG and IgM normal, normal axial coronal phase phospholipid, DVvT normal, negative lupus anticoagulant.. Serum protein electrophoresis negative for M spike. Onto ferritin negative. 11/29/2016,  homocystine 8, lupus anticoagulant was retested was negative, mildly low protein S activity at 48, normal protein S antigen. Slightly low protein S antigen. Normal anti-thrombin function. She is tested positive for single R506Q mutation heterozygous factor V Leiden mutation.  RADIOGRAPHIC STUDIES: I have personally reviewed the radiological images as listed and agreed with the findings in the report. CT without contrast on 07/30/2016 showed basilar and apparent from predominant ground glass opacities in the architectural distortion, with possible minimum lower lobe bronchiectasis suggesting interstitial lung disease. There is also mild the bilateral axillary, subpectoral, supraclavicular, mediastinal adenopathy.lymph nodes are increased in number, mildly enlarged. Largest left axillary measuring 11 mm short axis. Findings favor reactive/systemic process with autoimmune disorders.   Patient had CT anginal chest PE protocol on 11/29/2016 which showed acute appearing nonconclusive pulmonary emboli within several segmental pulmonary artery branches to the left lower lobe. Small pleural-based consolidation with the posterior aspect of the left lower lobe presumably associated focal pulmonary infarction. Associated small left pleural effusion. No evidence of associated right heart straining. Questionable focal low-density pulmonary embolus within the segmental pulmonary artery  branch to the right middle lobe.   ASSESSMENT & PLAN:  1. Other acute pulmonary embolism without acute cor pulmonale ([place])   2. Heterozygous factor V Leiden mutation ([place])   3. Chronic anticoagulation   4. Rheumatoid arthritis involving multiple sites with positive rheumatoid factor ([place])   5. Left lower quadrant pain   5  History of  oral contraceptives, advised to stop use.   # Continue Eliquis 5mg  BID. She just received a refill. I advised patient to call the office if she has low supply. # Heterozygous Factor V leiden mutation:  discussed with patient that factor V leiden heterozygous mutation increase first episode blood clot risk, however, does not increase risk of recurrent blood clots. Discuss about need of DVT prophylaxis if she anticipate any upcoming long car trip, surgery, pregnancy after she finished 6 months of . anticoagulation  She had CBC done on 01/05/2017 which showed stable hemoglobin 12.4.  # Decreased protein S activity;  Will recheck after she completes 6 months of anticoagulation   #Patient has contact her  sister who lives in [place] also is on OCP to have evaluation of factor V Leiden mutation status.  # Patient has stopped OCP use, but has not followed up with GYN for alternative contraceptive measures. Advise patient to follow up with GYN as she is on methotrexate chronically. She voices understanding.   # Acute left lower quadrant pain , possible diverticulitis on oral antibiotics. Advise patient to close monitor her pain. If her symptoms not better or worse and while taking antibiotics, encourage patient to follow up with primary care doctor to see if she can obtain CT scan for further evaluation for abscess. Patient is on methotrexate chronically potentially compromised immunity. She voices understanding.   All questions were answered. The patient knows to call the clinic with any problems questions or concerns. Return of visit:  2 months    Earlie Server, MD, PhD Hematology Oncology [facility] Pager- [phone] 01/08/2017

## 2017-01-09 ENCOUNTER — Ambulatory Visit
Admission: RE | Admit: 2017-01-09 | Discharge: 2017-01-09 | Disposition: A | Discharge status: Home / Self Care | Payer: BC Managed Care – PPO | Source: Ambulatory Visit | Attending: Internal Medicine | Admitting: Internal Medicine

## 2017-01-09 ENCOUNTER — Other Ambulatory Visit: Payer: Self-pay | Admitting: Internal Medicine

## 2017-01-09 DIAGNOSIS — M059 Rheumatoid arthritis with rheumatoid factor, unspecified: Secondary | ICD-10-CM | POA: Diagnosis not present

## 2017-01-09 DIAGNOSIS — R1032 Left lower quadrant pain: Secondary | ICD-10-CM | POA: Insufficient documentation

## 2017-01-09 MED ORDER — IOPAMIDOL (ISOVUE-300) INJECTION 61%
150.0000 mL | Freq: Once | INTRAVENOUS | Status: AC | PRN
  Administered 2017-01-09: 125 mL via INTRAVENOUS

## 2017-01-20 ENCOUNTER — Ambulatory Visit
Admission: RE | Admit: 2017-01-20 | Discharge: 2017-01-20 | Disposition: A | Discharge status: Home / Self Care | Payer: BC Managed Care – PPO | Source: Ambulatory Visit | Attending: Internal Medicine | Admitting: Internal Medicine

## 2017-01-20 ENCOUNTER — Other Ambulatory Visit
Admission: RE | Admit: 2017-01-20 | Discharge: 2017-01-20 | Disposition: A | Discharge status: Home / Self Care | Payer: BC Managed Care – PPO | Source: Ambulatory Visit | Attending: Internal Medicine | Admitting: Internal Medicine

## 2017-01-20 ENCOUNTER — Other Ambulatory Visit: Payer: Self-pay | Admitting: Internal Medicine

## 2017-01-20 DIAGNOSIS — R1032 Left lower quadrant pain: Secondary | ICD-10-CM | POA: Diagnosis not present

## 2017-01-20 DIAGNOSIS — Z86711 Personal history of pulmonary embolism: Secondary | ICD-10-CM | POA: Insufficient documentation

## 2017-01-20 DIAGNOSIS — R079 Chest pain, unspecified: Secondary | ICD-10-CM | POA: Diagnosis not present

## 2017-01-20 LAB — TROPONIN I: Troponin I: <0.03 ng/mL (ref <0.03)

## 2017-01-20 MED ORDER — IOPAMIDOL (ISOVUE-370) INJECTION 76%
75.0000 mL | Freq: Once | INTRAVENOUS | Status: AC | PRN
  Administered 2017-01-20: 75 mL via INTRAVENOUS

## 2017-03-12 ENCOUNTER — Ambulatory Visit: Payer: BC Managed Care – PPO | Admitting: Oncology

## 2017-03-12 ENCOUNTER — Other Ambulatory Visit: Payer: BC Managed Care – PPO

## 2017-03-14 ENCOUNTER — Other Ambulatory Visit: Payer: BC Managed Care – PPO

## 2017-03-14 ENCOUNTER — Ambulatory Visit: Payer: BC Managed Care – PPO | Admitting: Oncology

## 2017-03-16 NOTE — Progress Notes (Signed)
Hematology/Oncology follow-up [name]  Patient Care Team: Adin Hector, MD as PCP - General (Internal Medicine)  Reason for visit Follow-up for PE and low protein S level  Referring Physician: Dr.Fleming Herbon.  HISTORY OF PRESENTING ILLNESS:  Pamela Mills 36 y.o.  female with past medical history listed as below who  follow-up for management of anticoagulation due to recent PE. Since  November 2017, patient started to experience persistent dry cough, exertional shortness of breath, progressive swelling in hands and feet, she was referred to see rheumatology. Workup showed seropositive rheumatoid arthritis, Negative anti-CCP but elevated RF Patient was  Prednisone and later started on methotrexate weekly (started on 08/15/2016) for treatment.. Patient's respiratory symptoms improved and then worsen again. At that point she was referred by her primary care to see Dr. Raul Del pulmonary for evaluation. CT without contrast on 07/30/2016 showed basilar and apparent from predominant ground glass opacities in the architectural distortion, with possible minimum lower lobe bronchiectasis suggesting interstitial lung disease. There is also mild the bilateral axillary, subpectoral, supraclavicular, mediastinal adenopathy.lymph nodes are increased in number, mildly enlarged. Largest left axillary measuring 11 mm short axis. Findings favor reactive/systemic process with autoimmune disorders.  Patient's rheumatology symptoms improved with treatment. Patient had wax and wane of her respiratory symptoms. In the summer, she has had 2 round trips of 8 hour car ride back and forth to [place] where her father lives. Most recent one was on August 5. She has felt lower extremity swelling for which she has to put on stocking to reduce swelling. Also she felt a little stretchy feeling on her cough. She mentioned to Dr. Raul Del who ordered a follow-up CT scan. CT without contrast chest on  11/26/2016 showed patchy groundglass in the septal thickening in both lower lobes, less severe than 07/27/2016. There is new rounded consolidation in the left lower lobe, possible infectious etiology. No definitive evidence of interstitial lung disease. Meanwhile her respiratory symptoms has become worse. She has experienced coughing, exertional shortness of breath, and mild hemoptysis. This triggered a d-dimer testing which came back high. Patient had CT anginal chest PE protocol on 11/29/2016 which showed acute appearing nonconclusive pulmonary emboli within several segmental pulmonary artery branches to the left lower lobe. Small pleural-based consolidation with the posterior aspect of the left lower lobe presumably associated focal pulmonary infarction. Associated small left pleural effusion. No evidence of associated right heart straining. Questionable focal low-density pulmonary embolus within the segmental pulmonary artery branch to the right middle lobe.   lower extremity ultrasound revealed no DVT within the right lower extremity. Patient was started on Eliquis 5 mg twice a day.  Patient has had some rheumatology/hypercoagulable workup done with [facility]. Reviewing her labs showed on 08/08/2016, she is tested negative for anti-Cardiolite pain antibody IgG < 9, IgM slightly elevated at 15. Beta-2 glycoprotein IgG and IgM normal, normal axial coronal phase phospholipid, DVvT normal, negative lupus anticoagulant.. Serum protein electrophoresis negative for M spike.  11/29/2016, homocystine 8, lupus anticoagulant was retested was negative, mildly low protein S activity at 48, normal protein S antigen. Slightly low protein S antigen. Normal anti-thrombin function. She is tested positive for single R506Q mutation heterozygous factor V Leiden mutation.  Patient has a family history of ovarian cancer in her mom. She reports that she was tested negative for genetic test.  She takes OCP since she was  teenage.  This is her fist episode of blood clot. She was taking of OCP and  referred to see gynecology.  INTERVAL HISTORY Pamela Mills is a 36 y.o. female who has above history reviewed by me today presents for follow up visit for management of PE/  Patient continues to take Eliquis 5 mg twice a day and reports being compliant. She planned to have a car trip to [facility] tomorrow. During interval she was seen by GYN and she has had nexplanon implant and she was told this is okay even if she has a history of thrombosis. For the past couple of days, patient experienced right lower extremity calf cramps, which reminds her similar symptoms she experienced prior to the diagnosis of PE. Also at that time the right lower extremity ultrasound did not show a right lower extremity DVT. Denies any shortness of breath, chest pain. She coughed up some grayish sputum. Denies any hemoptysis.  ROS:  Review of Systems  Constitutional: Negative for fatigue and fever.  HENT:  Negative.  Negative for nosebleeds and sore throat.   Eyes: Negative.  Negative for icterus.  Respiratory: Negative for chest tightness.   Cardiovascular: Negative.  Negative for chest pain, leg swelling and palpitations.  Gastrointestinal: Negative for abdominal pain, blood in stool and vomiting.  Endocrine: Negative.  Negative for hot flashes.  Genitourinary: Negative.  Negative for difficulty urinating.   Musculoskeletal: Negative for myalgias and neck pain.       Right lower extremity calf cramps.  Skin: Negative.  Negative for itching and rash.  Neurological: Negative.  Negative for dizziness.  Hematological: Negative.  Negative for adenopathy.  Psychiatric/Behavioral: Negative for confusion. The patient is nervous/anxious.     MEDICAL HISTORY:  Past Medical History:  Diagnosis Date  . Anxiety   . Arthritis   . Depression   . Diverticulitis   . Migraines   . Pulmonary embolism ([place])     SURGICAL HISTORY: Past Surgical  History:  Procedure Laterality Date  . CHOLECYSTECTOMY N/A 06/29/2015   Procedure: LAPAROSCOPIC CHOLECYSTECTOMY;  Surgeon: Stark Klein, MD;  Location: [facility];  Service: General;  Laterality: N/A;  . DILATION AND EVACUATION N/A 11/10/2014   Procedure: DILATATION AND EVACUATION;  Surgeon: Honor Loh Ward, MD;  Location: [facility];  Service: Gynecology;  Laterality: N/A;  . FOOT SURGERY Left 01/2012  . LAPAROSCOPIC ENDOMETRIOSIS FULGURATION  2006  . UPPER GI ENDOSCOPY      SOCIAL HISTORY: Social History   Socioeconomic History  . Marital status: Married    Spouse name: Not on file  . Number of children: Not on file  . Years of education: Not on file  . Highest education level: Not on file  Social Needs  . Financial resource strain: Not on file  . Food insecurity - worry: Not on file  . Food insecurity - inability: Not on file  . Transportation needs - medical: Not on file  . Transportation needs - non-medical: Not on file  Occupational History  . Not on file  Tobacco Use  . Smoking status: Never Smoker  . Smokeless tobacco: Never Used  Substance and Sexual Activity  . Alcohol use: No  . Drug use: No  . Sexual activity: Not Currently  Other Topics Concern  . Not on file  Social History Narrative  . Not on file    FAMILY HISTORY: Family History  Problem Relation Age of Onset  . Ovarian cancer Mother 16  . Rheumatologic disease Maternal Aunt   . Pancreatic cancer Maternal Grandfather   . Colon cancer Paternal Grandmother   .  Breast cancer Neg Hx     ALLERGIES:  is allergic to other and gluten meal.  MEDICATIONS:  Current Outpatient Medications  Medication Sig Dispense Refill  . apixaban (ELIQUIS) 5 MG TABS tablet Take 5 mg by mouth 2 (two) times daily.    Marland Kitchen azelastine (ASTELIN) 0.1 % nasal spray Place 1 spray into both nostrils 2 (two) times daily. Use in each nostril as directed    . cetirizine (ZYRTEC) 10 MG tablet Take 10 mg by mouth daily.    .  Cholecalciferol (VITAMIN D3) 1000 units CAPS Take 1,000 Units by mouth daily.    . ciprofloxacin (CIPRO) 500 MG tablet Take 500 mg by mouth 2 (two) times daily.    Marland Kitchen escitalopram (LEXAPRO) 10 MG tablet Take 10 mg by mouth every evening.     . folic acid (FOLVITE) 1 MG tablet Take 1 mg by mouth daily.    Marland Kitchen LORazepam (ATIVAN) 0.5 MG tablet Take 0.5 mg by mouth as needed for anxiety.    . Methotrexate, PF, 25 MG/0.5ML SOAJ Inject 25 mg into the skin once a week.    . metroNIDAZOLE (FLAGYL) 500 MG tablet Take 500 mg by mouth 3 (three) times daily.    Marland Kitchen OMEPRAZOLE PO Take 20 mg by mouth daily.    . predniSONE (DELTASONE) 2.5 MG tablet Take 2.5 mg by mouth daily.    . SUMAtriptan (IMITREX) 50 MG tablet Take 50 mg by mouth as needed.    . topiramate (TOPAMAX) 100 MG tablet Take 100 mg by mouth at bedtime.    . vitamin B-12 (CYANOCOBALAMIN) 1000 MCG tablet Take 1,000 mcg by mouth daily.     No current facility-administered medications for this visit.       Marland Kitchen  PHYSICAL EXAMINATION: ECOG PERFORMANCE STATUS: 1 - Symptomatic but completely ambulatory Vitals:   03/17/17 0918  BP: 123/81  Pulse: 69  Resp: 18  Temp: (!) 96.8 F (36 C)   Filed Weights   03/17/17 0918  Weight: 220 lb (99.8 kg)   GENERAL: No distress, well nourished.  SKIN:  No rashes or significant lesions  HEAD: Normocephalic, No masses, lesions, tenderness or abnormalities  EYES: Conjunctiva are pink, non icteric ENT: External ears normal ,lips , buccal mucosa, and tongue normal and mucous membranes are moist  LYMPH: No palpable cervical and axillary lymphadenopathy  LUNGS: Clear to auscultation, no crackles or wheezes HEART: Regular rate & rhythm, no murmurs, no gallops, S1 normal and S2 normal  ABDOMEN: Abdomen soft, non-tender, normal bowel sounds, I did not appreciate any  masses or organomegaly  MUSCULOSKELETAL: No CVA tenderness and no tenderness on percussion of the back or rib cage.  EXTREMITIES: No edema, no skin  discoloration or tenderness NEURO: Alert & oriented, no focal motor/sensory deficits.     LABORATORY DATA:  I have reviewed the data as listed Patient has had some rheumatology/hypercoagulable workup done with [facility]. Reviewing her labs showed on 08/08/2016, she is tested negative for anti-Cardiolite pain antibody IgG < 9, IgM slightly elevated at 15. Beta-2 glycoprotein IgG and IgM normal, normal axial coronal phase phospholipid, DVvT normal, negative lupus anticoagulant.. Serum protein electrophoresis negative for M spike. Onto ferritin negative. 11/29/2016, homocystine 8, lupus anticoagulant was retested was negative, mildly low protein S activity at 48, normal protein S antigen. Slightly low protein S antigen. Normal anti-thrombin function. She is tested positive for single R506Q mutation heterozygous factor V Leiden mutation.  RADIOGRAPHIC STUDIES: I have personally reviewed the radiological  images as listed and agreed with the findings in the report. CT without contrast on 07/30/2016 showed basilar and apparent from predominant ground glass opacities in the architectural distortion, with possible minimum lower lobe bronchiectasis suggesting interstitial lung disease. There is also mild the bilateral axillary, subpectoral, supraclavicular, mediastinal adenopathy.lymph nodes are increased in number, mildly enlarged. Largest left axillary measuring 11 mm short axis. Findings favor reactive/systemic process with autoimmune disorders.   Patient had CT anginal chest PE protocol on 11/29/2016 which showed acute appearing nonconclusive pulmonary emboli within several segmental pulmonary artery branches to the left lower lobe. Small pleural-based consolidation with the posterior aspect of the left lower lobe presumably associated focal pulmonary infarction. Associated small left pleural effusion. No evidence of associated right heart straining. Questionable focal low-density pulmonary embolus  within the segmental pulmonary artery branch to the right middle lobe.   ASSESSMENT & PLAN:  1. Suspected deep vein thrombosis (DVT)   2. Heterozygous factor V Leiden mutation ([place])   3. Chronic anticoagulation   4. Other chronic pulmonary embolism without acute cor pulmonale (HCC)   5. Rheumatoid arthritis involving multiple sites with positive rheumatoid factor ([place])     # Will obtain a stat ultrasound of her right lower extremity to rule out DVT. # I recommend against Nextplanon as her contraceptive options as it contains estrogen which can increase her thrombosis risk. Will need to talk to her gyn.   ## Decreased protein S activity;  Will recheck after she completes 6 months of anticoagulation   # Addendum: US showed no right lower extremity DVT. Currently she does not have SOB, will hold repeat CT PE study.  She plan to have a car trip to [place]. I advise her to continue Eliquis 5mg  BID and if she experiences symptoms of SOB, chest pain, she needs to seek medical advice immediately. She voices understanding.   Follow up in 3 months.   Earlie Server, MD, PhD Hematology Oncology [facility] at [facility] Pager- [phone] 03/17/2017

## 2017-03-17 ENCOUNTER — Inpatient Hospital Stay: Payer: BC Managed Care – PPO | Attending: Oncology | Admitting: Oncology

## 2017-03-17 ENCOUNTER — Inpatient Hospital Stay: Payer: BC Managed Care – PPO

## 2017-03-17 ENCOUNTER — Other Ambulatory Visit: Payer: Self-pay

## 2017-03-17 ENCOUNTER — Ambulatory Visit
Admission: RE | Admit: 2017-03-17 | Discharge: 2017-03-17 | Disposition: A | Discharge status: Home / Self Care | Payer: BC Managed Care – PPO | Source: Ambulatory Visit | Attending: Oncology | Admitting: Oncology

## 2017-03-17 ENCOUNTER — Encounter: Payer: Self-pay | Admitting: Oncology

## 2017-03-17 VITALS — BP 123/81 | HR 69 | Temp 96.8°F | Resp 18 | Wt 220.0 lb

## 2017-03-17 DIAGNOSIS — M79604 Pain in right leg: Secondary | ICD-10-CM | POA: Insufficient documentation

## 2017-03-17 DIAGNOSIS — Z800 Family history of malignant neoplasm of digestive organs: Secondary | ICD-10-CM | POA: Diagnosis not present

## 2017-03-17 DIAGNOSIS — D6851 Activated protein C resistance: Secondary | ICD-10-CM | POA: Diagnosis not present

## 2017-03-17 DIAGNOSIS — M0579 Rheumatoid arthritis with rheumatoid factor of multiple sites without organ or systems involvement: Secondary | ICD-10-CM | POA: Diagnosis not present

## 2017-03-17 DIAGNOSIS — Z7901 Long term (current) use of anticoagulants: Secondary | ICD-10-CM | POA: Diagnosis not present

## 2017-03-17 DIAGNOSIS — F418 Other specified anxiety disorders: Secondary | ICD-10-CM | POA: Diagnosis not present

## 2017-03-17 DIAGNOSIS — Z9049 Acquired absence of other specified parts of digestive tract: Secondary | ICD-10-CM | POA: Diagnosis not present

## 2017-03-17 DIAGNOSIS — R590 Localized enlarged lymph nodes: Secondary | ICD-10-CM | POA: Diagnosis not present

## 2017-03-17 DIAGNOSIS — R600 Localized edema: Secondary | ICD-10-CM | POA: Diagnosis not present

## 2017-03-17 DIAGNOSIS — I2782 Chronic pulmonary embolism: Secondary | ICD-10-CM | POA: Diagnosis not present

## 2017-03-17 DIAGNOSIS — R252 Cramp and spasm: Secondary | ICD-10-CM | POA: Diagnosis not present

## 2017-03-17 DIAGNOSIS — R0989 Other specified symptoms and signs involving the circulatory and respiratory systems: Secondary | ICD-10-CM

## 2017-03-17 DIAGNOSIS — Z79899 Other long term (current) drug therapy: Secondary | ICD-10-CM | POA: Insufficient documentation

## 2017-03-17 DIAGNOSIS — Z8041 Family history of malignant neoplasm of ovary: Secondary | ICD-10-CM | POA: Insufficient documentation

## 2017-03-17 DIAGNOSIS — I2699 Other pulmonary embolism without acute cor pulmonale: Secondary | ICD-10-CM

## 2017-03-17 LAB — CBC WITH DIFFERENTIAL/PLATELET
BASOS ABS: 0 10*3/uL (ref 0–0.1)
Basophils Relative: 1 %
EOS PCT: 3 %
Eosinophils Absolute: 0.1 10*3/uL (ref 0–0.7)
HCT: 41.1 % (ref 35.0–47.0)
HEMOGLOBIN: 14.2 g/dL (ref 12.0–16.0)
LYMPHS ABS: 0.6 10*3/uL — AB (ref 1.0–3.6)
LYMPHS PCT: 15 %
MCH: 31.9 pg (ref 26.0–34.0)
MCHC: 34.6 g/dL (ref 32.0–36.0)
MCV: 92.1 fL (ref 80.0–100.0)
Monocytes Absolute: 0.3 10*3/uL (ref 0.2–0.9)
Monocytes Relative: 8 %
NEUTROS ABS: 2.9 10*3/uL (ref 1.4–6.5)
NEUTROS PCT: 73 %
PLATELETS: 206 10*3/uL (ref 150–440)
RBC: 4.47 MIL/uL (ref 3.80–5.20)
RDW: 14.6 % — ABNORMAL HIGH (ref 11.5–14.5)
WBC: 4 10*3/uL (ref 3.6–11.0)

## 2017-03-17 NOTE — Progress Notes (Signed)
Here for follow up . Over the last 72 hr has had more pain in R lower leg-calf to knee. Made worse by sitting. Movt helps. Achy feel per pt. Stated she plans to drive -9 h -to [facility] to see sister.

## 2017-05-13 DIAGNOSIS — R3915 Urgency of urination: Secondary | ICD-10-CM | POA: Insufficient documentation

## 2017-06-08 NOTE — Progress Notes (Signed)
Hematology/Oncology follow-up [name]  Patient Care Team: Adin Hector, MD as PCP - General (Internal Medicine)  Reason for visit Follow-up for PE and low protein S level  Referring Physician: Dr.Fleming Herbon.  HISTORY OF PRESENTING ILLNESS:  Pamela Mills 37 y.o.  female with past medical history listed as below who  follow-up for management of anticoagulation due to recent PE. Since  November 2017, patient started to experience persistent dry cough, exertional shortness of breath, progressive swelling in hands and feet, she was referred to see rheumatology. Workup showed seropositive rheumatoid arthritis, Negative anti-CCP but elevated RF Patient was  Prednisone and later started on methotrexate weekly (started on 08/15/2016) for treatment.. Patient's respiratory symptoms improved and then worsen again. At that point she was referred by her primary care to see Dr. Raul Del pulmonary for evaluation. CT without contrast on 07/30/2016 showed basilar and apparent from predominant ground glass opacities in the architectural distortion, with possible minimum lower lobe bronchiectasis suggesting interstitial lung disease. There is also mild the bilateral axillary, subpectoral, supraclavicular, mediastinal adenopathy.lymph nodes are increased in number, mildly enlarged. Largest left axillary measuring 11 mm short axis. Findings favor reactive/systemic process with autoimmune disorders.  Patient's rheumatology symptoms improved with treatment. Patient had wax and wane of her respiratory symptoms. In the summer, she has had 2 round trips of 8 hour car ride back and forth to [place] where her father lives. Most recent one was on August 5. She has felt lower extremity swelling for which she has to put on stocking to reduce swelling. Also she felt a little stretchy feeling on her cough. She mentioned to Dr. Raul Del who ordered a follow-up CT scan. CT without contrast chest on  11/26/2016 showed patchy groundglass in the septal thickening in both lower lobes, less severe than 07/27/2016. There is new rounded consolidation in the left lower lobe, possible infectious etiology. No definitive evidence of interstitial lung disease. Meanwhile her respiratory symptoms has become worse. She has experienced coughing, exertional shortness of breath, and mild hemoptysis. This triggered a d-dimer testing which came back high. Patient had CT anginal chest PE protocol on 11/29/2016 which showed acute appearing nonconclusive pulmonary emboli within several segmental pulmonary artery branches to the left lower lobe. Small pleural-based consolidation with the posterior aspect of the left lower lobe presumably associated focal pulmonary infarction. Associated small left pleural effusion. No evidence of associated right heart straining. Questionable focal low-density pulmonary embolus within the segmental pulmonary artery branch to the right middle lobe.   lower extremity ultrasound revealed no DVT within the right lower extremity. Patient was started on Eliquis 5 mg twice a day.  Patient has had some rheumatology/hypercoagulable workup done with [facility]. Reviewing her labs showed on 08/08/2016, she is tested negative for anti-Cardiolite pain antibody IgG < 9, IgM slightly elevated at 15. Beta-2 glycoprotein IgG and IgM normal, normal axial coronal phase phospholipid, DVvT normal, negative lupus anticoagulant.. Serum protein electrophoresis negative for M spike.  11/29/2016, homocystine 8, lupus anticoagulant was retested was negative, mildly low protein S activity at 48, normal protein S antigen. Slightly low protein S antigen. Normal anti-thrombin function. She is tested positive for single R506Q mutation heterozygous factor V Leiden mutation.  Patient has a family history of ovarian cancer in her mom. She reports that she was tested negative for genetic test.  She takes OCP since she was  teenage.  This is her fist episode of blood clot. She was taking of OCP and  referred to see gynecology.  INTERVAL HISTORY Pamela Mills is a 37 y.o. female who has above history reviewed by me today presents for follow up visit for management of PE.  She continues to take Eliquis 5 mg twice daily and reports being compliant.  Denies any acute bleeding events or easy bruising.  She has nexplanon implant for endometriosis treatment.. Denies any shortness of breath, chest pain.  She has bilateral lower extremity swelling at the end of the day.  Usually goes away in the morning. For her rheumatoid arthritis, she has been taking of prednisone recently.  Continued on methotrexate. ROS:  Review of Systems  Constitutional: Negative for diaphoresis, fatigue and fever.  HENT:  Negative.  Negative for nosebleeds, sore throat and tinnitus.   Eyes: Negative.  Negative for icterus.  Respiratory: Negative for chest tightness.   Cardiovascular: Negative for chest pain, leg swelling and palpitations.  Gastrointestinal: Negative for abdominal distention, abdominal pain, blood in stool, constipation and vomiting.  Endocrine: Negative for hot flashes.  Genitourinary: Negative for difficulty urinating, frequency, hematuria and nocturia.   Musculoskeletal: Negative for back pain, myalgias and neck pain.  Skin: Negative for itching and rash.  Neurological: Negative for dizziness and light-headedness.  Hematological: Negative for adenopathy.  Psychiatric/Behavioral: Negative for confusion and decreased concentration. The patient is not nervous/anxious.     MEDICAL HISTORY:  Past Medical History:  Diagnosis Date  . Anxiety   . Arthritis   . Depression   . Diverticulitis   . Migraines   . Pulmonary embolism ([place])     SURGICAL HISTORY: Past Surgical History:  Procedure Laterality Date  . CHOLECYSTECTOMY N/A 06/29/2015   Procedure: LAPAROSCOPIC CHOLECYSTECTOMY;  Surgeon: Stark Klein, MD;  Location: [facility];  Service: General;  Laterality: N/A;  . DILATION AND EVACUATION N/A 11/10/2014   Procedure: DILATATION AND EVACUATION;  Surgeon: Honor Loh Ward, MD;  Location: [facility];  Service: Gynecology;  Laterality: N/A;  . FOOT SURGERY Left 01/2012  . LAPAROSCOPIC ENDOMETRIOSIS FULGURATION  2006  . UPPER GI ENDOSCOPY      SOCIAL HISTORY: Social History   Socioeconomic History  . Marital status: Married    Spouse name: Not on file  . Number of children: Not on file  . Years of education: Not on file  . Highest education level: Not on file  Social Needs  . Financial resource strain: Not on file  . Food insecurity - worry: Not on file  . Food insecurity - inability: Not on file  . Transportation needs - medical: Not on file  . Transportation needs - non-medical: Not on file  Occupational History  . Not on file  Tobacco Use  . Smoking status: Never Smoker  . Smokeless tobacco: Never Used  Substance and Sexual Activity  . Alcohol use: No  . Drug use: No  . Sexual activity: Not Currently  Other Topics Concern  . Not on file  Social History Narrative  . Not on file    FAMILY HISTORY: Family History  Problem Relation Age of Onset  . Ovarian cancer Mother 93  . Rheumatologic disease Maternal Aunt   . Pancreatic cancer Maternal Grandfather   . Colon cancer Paternal Grandmother   . Breast cancer Neg Hx     ALLERGIES:  is allergic to other and gluten meal.  MEDICATIONS:  Current Outpatient Medications  Medication Sig Dispense Refill  . apixaban (ELIQUIS) 5 MG TABS tablet Take 5 mg by mouth 2 (two) times daily.    Marland Kitchen  azelastine (ASTELIN) 0.1 % nasal spray Place 1 spray into both nostrils 2 (two) times daily. Use in each nostril as directed    . cetirizine (ZYRTEC) 10 MG tablet Take 10 mg by mouth daily.    . Cholecalciferol (VITAMIN D3) 1000 units CAPS Take 1,000 Units by mouth daily.    Marland Kitchen escitalopram (LEXAPRO) 10 MG tablet Take 10 mg by mouth every evening.       . folic acid (FOLVITE) 1 MG tablet Take 1 mg by mouth daily.    Marland Kitchen LORazepam (ATIVAN) 0.5 MG tablet Take 0.5 mg by mouth as needed for anxiety.    . Methotrexate, PF, 25 MG/0.5ML SOAJ Inject 25 mg into the skin once a week.    Marland Kitchen OMEPRAZOLE PO Take 20 mg by mouth daily.    . predniSONE (DELTASONE) 2.5 MG tablet Take 2.5 mg by mouth daily.    . SUMAtriptan (IMITREX) 50 MG tablet Take 50 mg by mouth as needed.    . topiramate (TOPAMAX) 100 MG tablet Take 100 mg by mouth at bedtime.     . vitamin B-12 (CYANOCOBALAMIN) 1000 MCG tablet Take 1,000 mcg by mouth daily.     No current facility-administered medications for this visit.       Marland Kitchen  PHYSICAL EXAMINATION: ECOG PERFORMANCE STATUS: 0 - Asymptomatic Vitals:   06/09/17 1514 06/09/17 1520  BP:  124/83  Pulse:  65  Resp: 16   Temp:  98 F (36.7 C)   Filed Weights   06/09/17 1514  Weight: 220 lb (99.8 kg)   Physical Exam  Constitutional: She is oriented to person, place, and time and well-developed, well-nourished, and in no distress. No distress.  HENT:  Head: Normocephalic.  Mouth/Throat: Oropharynx is clear and moist. No oropharyngeal exudate.  Eyes: Conjunctivae and EOM are normal. Pupils are equal, round, and reactive to light. No scleral icterus.  Neck: Normal range of motion. Neck supple.  Cardiovascular: Normal rate, regular rhythm and normal heart sounds.  Pulmonary/Chest: Effort normal and breath sounds normal. She has no wheezes.  Abdominal: Bowel sounds are normal. There is no tenderness. There is no rebound.  Musculoskeletal: She exhibits no edema.  Lymphadenopathy:    She has no cervical adenopathy.  Neurological: She is alert and oriented to person, place, and time. No cranial nerve deficit.  Skin: Skin is dry. No erythema.  Psychiatric: Affect normal.      LABORATORY DATA:  I have reviewed the data as listed Patient has had some rheumatology/hypercoagulable workup done with [facility]. Reviewing her  labs showed on 08/08/2016, she is tested negative for anti-Cardiolite pain antibody IgG < 9, IgM slightly elevated at 15. Beta-2 glycoprotein IgG and IgM normal, normal axial coronal phase phospholipid, DVvT normal, negative lupus anticoagulant.. Serum protein electrophoresis negative for M spike. Onto ferritin negative. 11/29/2016, homocystine 8, lupus anticoagulant was retested was negative, mildly low protein S activity at 48, normal protein S antigen. Slightly low protein S antigen. Normal anti-thrombin function. She is tested positive for single R506Q mutation heterozygous factor V Leiden mutation.  RADIOGRAPHIC STUDIES: I have personally reviewed the radiological images as listed and agreed with the findings in the report. CT without contrast on 07/30/2016 showed basilar and apparent from predominant ground glass opacities in the architectural distortion, with possible minimum lower lobe bronchiectasis suggesting interstitial lung disease. There is also mild the bilateral axillary, subpectoral, supraclavicular, mediastinal adenopathy.lymph nodes are increased in number, mildly enlarged. Largest left axillary measuring 11 mm short axis.  Findings favor reactive/systemic process with autoimmune disorders.   Patient had CT anginal chest PE protocol on 11/29/2016 which showed acute appearing nonconclusive pulmonary emboli within several segmental pulmonary artery branches to the left lower lobe. Small pleural-based consolidation with the posterior aspect of the left lower lobe presumably associated focal pulmonary infarction. Associated small left pleural effusion. No evidence of associated right heart straining. Questionable focal low-density pulmonary embolus within the segmental pulmonary artery branch to the right middle lobe.   ASSESSMENT & PLAN:  1. Heterozygous factor V Leiden mutation ([place])   2. Chronic anticoagulation     #Discussed with patient that [place] still contains estrogen and may  increase her thrombosis risk.  I encouraged her to talk to her GYN to see if there is any known hormone options.    ## Decreased protein S activity; positive anticardiolipin IgM, she has completed about 6 months of anticoagulation.  Discussed with patient that we can recheck her protein S activity and antiphospholipid panel.  I advised patient to stop Eliquis for 3 days and have blood work done in the resume on Eliquis. Given her factor V Leiden heterozygous mutation status, still being on estrogen for treatment of endometriosis, I would continue her on Eliquis.  Will discuss about low-dose 2.5mg  Eliquis BID as maintenance.  Patient voices understanding.  She also is anxious about being taken off anticoagulation given her upcoming international trips.  Follow up in 3 months.   Earlie Server, MD, PhD Hematology Oncology [facility] Pager- [phone] 06/09/2017

## 2017-06-09 ENCOUNTER — Other Ambulatory Visit: Payer: Self-pay

## 2017-06-09 ENCOUNTER — Inpatient Hospital Stay: Payer: BC Managed Care – PPO | Attending: Oncology | Admitting: Oncology

## 2017-06-09 ENCOUNTER — Encounter: Payer: Self-pay | Admitting: Oncology

## 2017-06-09 VITALS — BP 124/83 | HR 65 | Temp 98.0°F | Resp 16 | Ht 67.0 in | Wt 220.0 lb

## 2017-06-09 DIAGNOSIS — I2782 Chronic pulmonary embolism: Secondary | ICD-10-CM | POA: Insufficient documentation

## 2017-06-09 DIAGNOSIS — D6851 Activated protein C resistance: Secondary | ICD-10-CM

## 2017-06-09 DIAGNOSIS — Z7901 Long term (current) use of anticoagulants: Secondary | ICD-10-CM | POA: Diagnosis not present

## 2017-06-09 NOTE — Progress Notes (Signed)
Patient here for follow up. No changes since last appt. 

## 2017-06-13 ENCOUNTER — Inpatient Hospital Stay: Payer: BC Managed Care – PPO

## 2017-06-13 DIAGNOSIS — I2782 Chronic pulmonary embolism: Secondary | ICD-10-CM | POA: Diagnosis not present

## 2017-06-13 DIAGNOSIS — D6851 Activated protein C resistance: Secondary | ICD-10-CM

## 2017-06-13 DIAGNOSIS — Z7901 Long term (current) use of anticoagulants: Secondary | ICD-10-CM

## 2017-06-13 LAB — CBC WITH DIFFERENTIAL/PLATELET
Basophils Absolute: 0.1 10*3/uL (ref 0–0.1)
Basophils Relative: 1 %
EOS ABS: 0.2 10*3/uL (ref 0–0.7)
EOS PCT: 4 %
HCT: 38.2 % (ref 35.0–47.0)
Hemoglobin: 13.2 g/dL (ref 12.0–16.0)
Lymphocytes Relative: 20 %
Lymphs Abs: 0.7 10*3/uL — ABNORMAL LOW (ref 1.0–3.6)
MCH: 32.1 pg (ref 26.0–34.0)
MCHC: 34.4 g/dL (ref 32.0–36.0)
MCV: 93.3 fL (ref 80.0–100.0)
MONO ABS: 0.4 10*3/uL (ref 0.2–0.9)
MONOS PCT: 11 %
Neutro Abs: 2.3 10*3/uL (ref 1.4–6.5)
Neutrophils Relative %: 64 %
PLATELETS: 193 10*3/uL (ref 150–440)
RBC: 4.1 MIL/uL (ref 3.80–5.20)
RDW: 14.4 % (ref 11.5–14.5)
WBC: 3.7 10*3/uL (ref 3.6–11.0)

## 2017-06-13 LAB — BASIC METABOLIC PANEL
Anion gap: 9 (ref 5–15)
BUN: 13 mg/dL (ref 6–20)
CHLORIDE: 113 mmol/L — AB (ref 101–111)
CO2: 16 mmol/L — AB (ref 22–32)
CREATININE: 1.12 mg/dL — AB (ref 0.44–1.00)
Calcium: 8.5 mg/dL — ABNORMAL LOW (ref 8.9–10.3)
GFR calc Af Amer: >60 mL/min (ref >60)
GFR calc non Af Amer: >60 mL/min (ref >60)
GLUCOSE: 92 mg/dL (ref 65–99)
Potassium: 3.6 mmol/L (ref 3.5–5.1)
Sodium: 138 mmol/L (ref 135–145)

## 2017-06-15 LAB — PROTEIN S ACTIVITY: Protein S Activity: 75 % (ref 63–140)

## 2017-06-15 LAB — PROTEIN S, TOTAL: Protein S Ag, Total: 52 % — ABNORMAL LOW (ref 60–150)

## 2017-06-16 LAB — ANTIPHOSPHOLIPID SYNDROME PROF
Anticardiolipin IgG: <9 GPL U/mL (ref 0–14)
Anticardiolipin IgM: 15 MPL U/mL — ABNORMAL HIGH (ref 0–12)
DRVVT: 34.3 s (ref 0.0–47.0)
PTT LA: 38.6 s (ref 0.0–51.9)

## 2017-06-20 ENCOUNTER — Other Ambulatory Visit: Payer: Self-pay | Admitting: Obstetrics & Gynecology

## 2017-06-20 DIAGNOSIS — N6489 Other specified disorders of breast: Secondary | ICD-10-CM

## 2017-06-23 ENCOUNTER — Telehealth: Payer: Self-pay | Admitting: *Deleted

## 2017-06-23 ENCOUNTER — Encounter: Payer: Self-pay | Admitting: *Deleted

## 2017-06-23 ENCOUNTER — Telehealth: Payer: Self-pay | Admitting: Oncology

## 2017-06-23 ENCOUNTER — Other Ambulatory Visit: Payer: Self-pay | Admitting: Oncology

## 2017-06-23 DIAGNOSIS — D6859 Other primary thrombophilia: Secondary | ICD-10-CM

## 2017-06-23 NOTE — Telephone Encounter (Signed)
-----   Message from Wilburn Cornelia sent at 06/23/2017 10:33 AM EDT ----- Regarding: lab results? Contact: [phone] Pt called for lab results.

## 2017-06-23 NOTE — Telephone Encounter (Deleted)
Patient is scheduled to come in on Friday to discuss her lab results, sent her a mychart message letting her know that they'll be discussed at that time.

## 2017-06-23 NOTE — Telephone Encounter (Signed)
Dx:  Chronic anticoagulation; Heterozygous...   Ref Range & Units 10d ago  WBC 3.6 - 11.0 K/uL 3.7   RBC 3.80 - 5.20 MIL/uL 4.10   Hemoglobin 12.0 - 16.0 g/dL 13.2   HCT 35.0 - 47.0 % 38.2   MCV 80.0 - 100.0 fL 93.3   MCH 26.0 - 34.0 pg 32.1   MCHC 32.0 - 36.0 g/dL 34.4   RDW 11.5 - 14.5 % 14.4   Platelets 150 - 440 K/uL 193   Neutrophils Relative % % 64   Neutro Abs 1.4 - 6.5 K/uL 2.3   Lymphocytes Relative % 20   Lymphs Abs 1.0 - 3.6 K/uL 0.7Low    Monocytes Relative % 11   Monocytes Absolute 0.2 - 0.9 K/uL 0.4   Eosinophils Relative % 4   Eosinophils Absolute 0 - 0.7 K/uL 0.2   Basophils Relative % 1   Basophils Absolute 0 - 0.1 K/uL 0.1   Comment: Performed at [facility], [address] [place], [place] [address]       Dx:  Chronic anticoagulation; Heterozygous...   Ref Range & Units 10d ago  Sodium 135 - 145 mmol/L 138   Potassium 3.5 - 5.1 mmol/L 3.6   Chloride 101 - 111 mmol/L 113High    CO2 22 - 32 mmol/L 16Low    Glucose, Bld 65 - 99 mg/dL 92   BUN 6 - 20 mg/dL 13   Creatinine, Ser 0.44 - 1.00 mg/dL 1.12High    Calcium 8.9 - 10.3 mg/dL 8.5Low    GFR calc non Af Amer >60 mL/min >60   GFR calc Af Amer >60 mL/min >60   Comment: (NOTE)  The eGFR has been calculated using the CKD EPI equation.  This calculation has not been validated in all clinical situations.  eGFR's persistently <60 mL/min signify possible Chronic Kidney  Disease.   Anion gap 5 - 15 9   Comment: Performed at [facility], [address], [place], [place] [address]       Dx:  Chronic anticoagulation; Heterozygous...   Ref Range & Units 10d ago  Protein S Ag, Total 60 - 150 % 52Low         Dx:  Chronic anticoagulation; Heterozygous...   Ref Range & Units 10d ago  Protein S Activity 63 - 140 % 75   Comment: (NOTE)  Protein S activity may be falsely increased (masking an abnormal, low  result) in patients receiving direct Xa inhibitor (e.g.,  rivaroxaban, apixaban,  edoxaban) or a direct thrombin inhibitor  (e.g., dabigatran) anticoagulant treatment due to assay interference  by these drugs.        Dx:  Chronic anticoagulation; Heterozygous...   Ref Range & Units 10d ago  Anticardiolipin IgG 0 - 14 GPL U/mL <9   Comment: (NOTE)              Negative:       <15              Indeterminate:   15 - 20              Low-Med Positive: >20 - 80              High Positive:     >80   Anticardiolipin IgM 0 - 12 MPL U/mL 15High    Comment: (NOTE)              Negative:       <13  Indeterminate:   13 - 20              Low-Med Positive: >20 - 80              High Positive:     >80   PTT Lupus Anticoagulant 0.0 - 51.9 sec 38.6   DRVVT 0.0 - 47.0 sec 34.3   Lupus Anticoag Interp  Comment:   Comment: (NOTE)  No lupus anticoagulant was detected.  Performed At: [facility]

## 2017-06-23 NOTE — Telephone Encounter (Signed)
Communicated thrombophilia work up results.  Will obtain additional work up includes protein S total and free Ag level. She will hold her eliquis for 72 hours before labs.  She reports having weight gain,and her creatinine levels have increased comparing to her baseline. I advise patient to call her pcp and have work up done, thyroid function,nephrology eval etc. She voices understanding.

## 2017-06-24 NOTE — Telephone Encounter (Signed)
Dr. Yu called pt.

## 2017-06-27 ENCOUNTER — Other Ambulatory Visit: Payer: BC Managed Care – PPO

## 2017-07-04 ENCOUNTER — Inpatient Hospital Stay: Payer: BC Managed Care – PPO | Attending: Oncology

## 2017-07-04 DIAGNOSIS — D6859 Other primary thrombophilia: Secondary | ICD-10-CM

## 2017-07-09 ENCOUNTER — Ambulatory Visit
Admission: RE | Admit: 2017-07-09 | Discharge: 2017-07-09 | Disposition: A | Discharge status: Home / Self Care | Payer: BC Managed Care – PPO | Source: Ambulatory Visit | Attending: Obstetrics & Gynecology | Admitting: Obstetrics & Gynecology

## 2017-07-09 DIAGNOSIS — N6489 Other specified disorders of breast: Secondary | ICD-10-CM

## 2017-07-14 ENCOUNTER — Other Ambulatory Visit: Payer: Self-pay | Admitting: Internal Medicine

## 2017-07-14 DIAGNOSIS — R9389 Abnormal findings on diagnostic imaging of other specified body structures: Secondary | ICD-10-CM

## 2017-08-14 ENCOUNTER — Other Ambulatory Visit: Payer: Self-pay | Admitting: Internal Medicine

## 2017-08-14 DIAGNOSIS — R1032 Left lower quadrant pain: Secondary | ICD-10-CM

## 2017-08-14 DIAGNOSIS — R918 Other nonspecific abnormal finding of lung field: Secondary | ICD-10-CM

## 2017-08-19 ENCOUNTER — Ambulatory Visit
Admission: RE | Admit: 2017-08-19 | Discharge: 2017-08-19 | Disposition: A | Discharge status: Home / Self Care | Payer: BC Managed Care – PPO | Source: Ambulatory Visit | Attending: Internal Medicine | Admitting: Internal Medicine

## 2017-08-19 ENCOUNTER — Other Ambulatory Visit: Payer: Self-pay | Admitting: Internal Medicine

## 2017-08-19 DIAGNOSIS — R1032 Left lower quadrant pain: Secondary | ICD-10-CM

## 2017-08-19 DIAGNOSIS — R918 Other nonspecific abnormal finding of lung field: Secondary | ICD-10-CM

## 2017-08-19 DIAGNOSIS — M4807 Spinal stenosis, lumbosacral region: Secondary | ICD-10-CM | POA: Insufficient documentation

## 2017-08-19 DIAGNOSIS — K5730 Diverticulosis of large intestine without perforation or abscess without bleeding: Secondary | ICD-10-CM | POA: Diagnosis not present

## 2017-08-19 HISTORY — DX: Malignant (primary) neoplasm, unspecified: C80.1

## 2017-08-19 HISTORY — DX: Systemic involvement of connective tissue, unspecified: M35.9

## 2017-08-19 MED ORDER — IOPAMIDOL (ISOVUE-300) INJECTION 61%
100.0000 mL | Freq: Once | INTRAVENOUS | Status: AC | PRN
  Administered 2017-08-19: 100 mL via INTRAVENOUS

## 2017-08-20 ENCOUNTER — Other Ambulatory Visit: Payer: Self-pay | Admitting: Internal Medicine

## 2017-08-20 DIAGNOSIS — R9389 Abnormal findings on diagnostic imaging of other specified body structures: Secondary | ICD-10-CM

## 2017-08-20 DIAGNOSIS — R1032 Left lower quadrant pain: Secondary | ICD-10-CM

## 2017-09-08 ENCOUNTER — Encounter: Payer: Self-pay | Admitting: Oncology

## 2017-09-08 ENCOUNTER — Other Ambulatory Visit: Payer: Self-pay

## 2017-09-08 ENCOUNTER — Inpatient Hospital Stay: Payer: BC Managed Care – PPO | Admitting: Oncology

## 2017-09-08 ENCOUNTER — Inpatient Hospital Stay: Payer: BC Managed Care – PPO | Attending: Oncology

## 2017-09-08 VITALS — BP 105/70 | HR 50 | Temp 98.2°F | Resp 18 | Wt 231.6 lb

## 2017-09-08 DIAGNOSIS — R1032 Left lower quadrant pain: Secondary | ICD-10-CM

## 2017-09-08 DIAGNOSIS — Z7901 Long term (current) use of anticoagulants: Secondary | ICD-10-CM

## 2017-09-08 DIAGNOSIS — D6851 Activated protein C resistance: Secondary | ICD-10-CM | POA: Diagnosis present

## 2017-09-08 DIAGNOSIS — R5383 Other fatigue: Secondary | ICD-10-CM | POA: Insufficient documentation

## 2017-09-08 DIAGNOSIS — Z86711 Personal history of pulmonary embolism: Secondary | ICD-10-CM | POA: Insufficient documentation

## 2017-09-08 DIAGNOSIS — M0579 Rheumatoid arthritis with rheumatoid factor of multiple sites without organ or systems involvement: Secondary | ICD-10-CM | POA: Insufficient documentation

## 2017-09-08 LAB — CBC WITH DIFFERENTIAL/PLATELET
BASOS ABS: 0 10*3/uL (ref 0–0.1)
Basophils Relative: 1 %
EOS PCT: 2 %
Eosinophils Absolute: 0.1 10*3/uL (ref 0–0.7)
HCT: 40.9 % (ref 35.0–47.0)
Hemoglobin: 14.4 g/dL (ref 12.0–16.0)
LYMPHS ABS: 1.1 10*3/uL (ref 1.0–3.6)
LYMPHS PCT: 17 %
MCH: 33 pg (ref 26.0–34.0)
MCHC: 35.1 g/dL (ref 32.0–36.0)
MCV: 94 fL (ref 80.0–100.0)
Monocytes Absolute: 0.3 10*3/uL (ref 0.2–0.9)
Monocytes Relative: 5 %
NEUTROS ABS: 4.7 10*3/uL (ref 1.4–6.5)
Neutrophils Relative %: 75 %
PLATELETS: 222 10*3/uL (ref 150–440)
RBC: 4.35 MIL/uL (ref 3.80–5.20)
RDW: 14.9 % — ABNORMAL HIGH (ref 11.5–14.5)
WBC: 6.2 10*3/uL (ref 3.6–11.0)

## 2017-09-08 LAB — COMPREHENSIVE METABOLIC PANEL
ALT: 15 U/L (ref 14–54)
ANION GAP: 6 (ref 5–15)
AST: 22 U/L (ref 15–41)
Albumin: 4 g/dL (ref 3.5–5.0)
Alkaline Phosphatase: 46 U/L (ref 38–126)
BILIRUBIN TOTAL: 0.8 mg/dL (ref 0.3–1.2)
BUN: 14 mg/dL (ref 6–20)
CHLORIDE: 113 mmol/L — AB (ref 101–111)
CO2: 18 mmol/L — ABNORMAL LOW (ref 22–32)
Calcium: 9 mg/dL (ref 8.9–10.3)
Creatinine, Ser: 0.92 mg/dL (ref 0.44–1.00)
Glucose, Bld: 149 mg/dL — ABNORMAL HIGH (ref 65–99)
POTASSIUM: 3.3 mmol/L — AB (ref 3.5–5.1)
Sodium: 137 mmol/L (ref 135–145)
TOTAL PROTEIN: 6.8 g/dL (ref 6.5–8.1)

## 2017-09-08 NOTE — Progress Notes (Signed)
Patient here today for follow up. Voiced pain 5.5/10 to Left lower quadrant and left and right upper quadrant.

## 2017-09-08 NOTE — Progress Notes (Signed)
Hematology/Oncology follow-up [name]  Patient Care Team: Adin Hector, MD as PCP - General (Internal Medicine)  Reason for visit Follow-up for PE and low protein S level  Referring Physician: Dr.Fleming Herbon.  HISTORY OF PRESENTING ILLNESS:  Pamela Mills 37 y.o.  female with past medical history listed as below who  follow-up for management of anticoagulation due to recent PE. Since  November 2017, patient started to experience persistent dry cough, exertional shortness of breath, progressive swelling in hands and feet, she was referred to see rheumatology. Workup showed seropositive rheumatoid arthritis, Negative anti-CCP but elevated RF Patient was  Prednisone and later started on methotrexate weekly (started on 08/15/2016) for treatment.. Patient's respiratory symptoms improved and then worsen again. At that point she was referred by her primary care to see Dr. Raul Del pulmonary for evaluation. CT without contrast on 07/30/2016 showed basilar and apparent from predominant ground glass opacities in the architectural distortion, with possible minimum lower lobe bronchiectasis suggesting interstitial lung disease. There is also mild the bilateral axillary, subpectoral, supraclavicular, mediastinal adenopathy.lymph nodes are increased in number, mildly enlarged. Largest left axillary measuring 11 mm short axis. Findings favor reactive/systemic process with autoimmune disorders.  Patient's rheumatology symptoms improved with treatment. Patient had wax and wane of her respiratory symptoms. In the summer, she has had 2 round trips of 8 hour car ride back and forth to [place] where her father lives. Most recent one was on August 5. She has felt lower extremity swelling for which she has to put on stocking to reduce swelling. Also she felt a little stretchy feeling on her cough. She mentioned to Dr. Raul Del who ordered a follow-up CT scan. CT without contrast chest on  11/26/2016 showed patchy groundglass in the septal thickening in both lower lobes, less severe than 07/27/2016. There is new rounded consolidation in the left lower lobe, possible infectious etiology. No definitive evidence of interstitial lung disease. Meanwhile her respiratory symptoms has become worse. She has experienced coughing, exertional shortness of breath, and mild hemoptysis. This triggered a d-dimer testing which came back high. Patient had CT anginal chest PE protocol on 11/29/2016 which showed acute appearing nonconclusive pulmonary emboli within several segmental pulmonary artery branches to the left lower lobe. Small pleural-based consolidation with the posterior aspect of the left lower lobe presumably associated focal pulmonary infarction. Associated small left pleural effusion. No evidence of associated right heart straining. Questionable focal low-density pulmonary embolus within the segmental pulmonary artery branch to the right middle lobe.   lower extremity ultrasound revealed no DVT within the right lower extremity. Patient was started on Eliquis 5 mg twice a day.  Patient has had some rheumatology/hypercoagulable workup done with [facility]. Reviewing her labs showed on 08/08/2016, she is tested negative for anti-Cardiolite pain antibody IgG < 9, IgM slightly elevated at 15. Beta-2 glycoprotein IgG and IgM normal, normal axial coronal phase phospholipid, DVvT normal, negative lupus anticoagulant.. Serum protein electrophoresis negative for M spike.  11/29/2016, homocystine 8, lupus anticoagulant was retested was negative, mildly low protein S activity at 48, normal protein S antigen. Slightly low protein S antigen. Normal anti-thrombin function. She is tested positive for single R506Q mutation heterozygous factor V Leiden mutation.  Patient has a family history of ovarian cancer in her mom. She reports that she was tested negative for genetic test.  She takes OCP since she was  teenage.  This is her fist episode of blood clot. She was taking of OCP and  referred to see gynecology.  INTERVAL HISTORY Pamela Mills is a 37 y.o. female who has above history reviewed by me today presents for follow-up visit for management of PE.  She continues to take Eliquis 5 mg twice daily and reports being compliant.  Denies any acute bleeding events or easy bruising. She is on nexplanon implant for endometriosis treatments.  #Joint pain, acute on chronic, worsening.  Patient is on methotrexate for treating of rheumatoid arthritis. #Left lower quadrant abdominal pain, intermittent, chronic, 4 months.  No aggravating factors or improving factors.  Associated with alternation of diarrhea and constipation..  . Fatigue: reports worsening fatigue. Chronic onset, perisistent, no aggravating or improving factors, no associated symptoms.   ROS:  Review of Systems  Constitutional: Positive for fatigue. Negative for diaphoresis and fever.  HENT:  Negative.  Negative for nosebleeds, sore throat and tinnitus.   Eyes: Negative.  Negative for icterus.  Respiratory: Negative for chest tightness.   Cardiovascular: Negative for chest pain, leg swelling and palpitations.  Gastrointestinal: Positive for abdominal pain. Negative for abdominal distention, blood in stool, constipation and vomiting.  Endocrine: Negative for hot flashes.  Genitourinary: Negative for difficulty urinating, frequency, hematuria and nocturia.   Musculoskeletal: Positive for arthralgias, back pain and myalgias. Negative for neck pain.  Skin: Negative for itching and rash.  Neurological: Negative for dizziness and light-headedness.  Hematological: Negative for adenopathy.  Psychiatric/Behavioral: Negative for confusion and decreased concentration. The patient is not nervous/anxious.     MEDICAL HISTORY:  Past Medical History:  Diagnosis Date  . Anxiety   . Arthritis   . Cancer ([place])   . Collagen vascular disease ([place])   .  Depression   . Diverticulitis   . Migraines   . Pulmonary embolism ([place])     SURGICAL HISTORY: Past Surgical History:  Procedure Laterality Date  . CHOLECYSTECTOMY N/A 06/29/2015   Procedure: LAPAROSCOPIC CHOLECYSTECTOMY;  Surgeon: Stark Klein, MD;  Location: [facility];  Service: General;  Laterality: N/A;  . DILATION AND EVACUATION N/A 11/10/2014   Procedure: DILATATION AND EVACUATION;  Surgeon: Honor Loh Ward, MD;  Location: [facility];  Service: Gynecology;  Laterality: N/A;  . FOOT SURGERY Left 01/2012  . LAPAROSCOPIC ENDOMETRIOSIS FULGURATION  2006  . UPPER GI ENDOSCOPY      SOCIAL HISTORY: Social History   Socioeconomic History  . Marital status: Married    Spouse name: Not on file  . Number of children: Not on file  . Years of education: Not on file  . Highest education level: Not on file  Occupational History  . Not on file  Social Needs  . Financial resource strain: Not on file  . Food insecurity:    Worry: Not on file    Inability: Not on file  . Transportation needs:    Medical: Not on file    Non-medical: Not on file  Tobacco Use  . Smoking status: Never Smoker  . Smokeless tobacco: Never Used  Substance and Sexual Activity  . Alcohol use: No  . Drug use: No  . Sexual activity: Not Currently  Lifestyle  . Physical activity:    Days per week: Not on file    Minutes per session: Not on file  . Stress: Not on file  Relationships  . Social connections:    Talks on phone: Not on file    Gets together: Not on file    Attends religious service: Not on file    Active member of club  or organization: Not on file    Attends meetings of clubs or organizations: Not on file    Relationship status: Not on file  . Intimate partner violence:    Fear of current or ex partner: Not on file    Emotionally abused: Not on file    Physically abused: Not on file    Forced sexual activity: Not on file  Other Topics Concern  . Not on file  Social History  Narrative  . Not on file    FAMILY HISTORY: Family History  Problem Relation Age of Onset  . Ovarian cancer Mother 30  . Rheumatologic disease Maternal Aunt   . Pancreatic cancer Maternal Grandfather   . Colon cancer Paternal Grandmother   . Breast cancer Neg Hx     ALLERGIES:  is allergic to other and gluten meal.  MEDICATIONS:  Current Outpatient Medications  Medication Sig Dispense Refill  . apixaban (ELIQUIS) 5 MG TABS tablet Take 5 mg by mouth 2 (two) times daily.    . cetirizine (ZYRTEC) 10 MG tablet Take 10 mg by mouth daily.    . Cholecalciferol (VITAMIN D3) 1000 units CAPS Take 1,000 Units by mouth daily.    Marland Kitchen escitalopram (LEXAPRO) 10 MG tablet Take 10 mg by mouth every evening.     . folic acid (FOLVITE) 1 MG tablet Take 1 mg by mouth daily.    Marland Kitchen LORazepam (ATIVAN) 0.5 MG tablet Take 0.5 mg by mouth as needed for anxiety.    . Methotrexate, PF, 25 MG/0.5ML SOAJ Inject 25 mg into the skin once a week.    Marland Kitchen OMEPRAZOLE PO Take 20 mg by mouth daily.    . predniSONE (DELTASONE) 10 MG tablet Take 4 tabs daily for 2 days, then 3 tabs daily for 2 days, then 2 tabs daily for 2 days, then 1 tab daily for 2 days    . SUMAtriptan (IMITREX) 50 MG tablet Take 50 mg by mouth as needed.    . topiramate (TOPAMAX) 100 MG tablet Take 100 mg by mouth at bedtime.     . triamcinolone (NASACORT ALLERGY 24HR) 55 MCG/ACT AERO nasal inhaler Place 2 sprays into the nose daily.    . vitamin B-12 (CYANOCOBALAMIN) 1000 MCG tablet Take 1,000 mcg by mouth daily.    Marland Kitchen azelastine (ASTELIN) 0.1 % nasal spray Place 1 spray into both nostrils 2 (two) times daily. Use in each nostril as directed     No current facility-administered medications for this visit.       Marland Kitchen  PHYSICAL EXAMINATION: ECOG PERFORMANCE STATUS: 0 - Asymptomatic Vitals:   09/08/17 1126 09/08/17 1141  BP: (!) 89/60 105/70  Pulse: (!) 49 (!) 50  Resp:    Temp:     Filed Weights   09/08/17 1048  Weight: 231 lb 9.6 oz (105.1  kg)   Physical Exam  Constitutional: She is oriented to person, place, and time and well-developed, well-nourished, and in no distress. No distress.  HENT:  Head: Normocephalic.  Mouth/Throat: Oropharynx is clear and moist. No oropharyngeal exudate.  Eyes: Pupils are equal, round, and reactive to light. Conjunctivae and EOM are normal. No scleral icterus.  Neck: Normal range of motion. Neck supple.  Cardiovascular: Normal rate, regular rhythm and normal heart sounds.  Pulmonary/Chest: Effort normal. She has no wheezes.  Decreased breath laterally at the base.  Abdominal: Bowel sounds are normal. There is no tenderness. There is no rebound.  Musculoskeletal: She exhibits no edema.  Lymphadenopathy:  She has no cervical adenopathy.  Neurological: She is alert and oriented to person, place, and time. No cranial nerve deficit.  Skin: Skin is dry. No erythema.  Psychiatric: Affect normal.  Anxious    LABORATORY DATA:  I have reviewed the data as listed Patient has had some rheumatology/hypercoagulable workup done with [facility]. Reviewing her labs showed on 08/08/2016, she is tested negative for anti-Cardiolite pain antibody IgG < 9, IgM slightly elevated at 15. Beta-2 glycoprotein IgG and IgM normal, normal axial coronal phase phospholipid, DVvT normal, negative lupus anticoagulant.. Serum protein electrophoresis negative for M spike. Onto ferritin negative. 11/29/2016, homocystine 8, lupus anticoagulant was retested was negative, mildly low protein S activity at 48, normal protein S antigen. Slightly low protein S antigen. Normal anti-thrombin function. She is tested positive for single R506Q mutation heterozygous factor V Leiden mutation.  RADIOGRAPHIC STUDIES: I have personally reviewed the radiological images as listed and agreed with the findings in the report. CT without contrast on 07/30/2016 showed basilar and apparent from predominant ground glass opacities in the  architectural distortion, with possible minimum lower lobe bronchiectasis suggesting interstitial lung disease. There is also mild the bilateral axillary, subpectoral, supraclavicular, mediastinal adenopathy.lymph nodes are increased in number, mildly enlarged. Largest left axillary measuring 11 mm short axis. Findings favor reactive/systemic process with autoimmune disorders.   Patient had CT anginal chest PE protocol on 11/29/2016 which showed acute appearing nonconclusive pulmonary emboli within several segmental pulmonary artery branches to the left lower lobe. Small pleural-based consolidation with the posterior aspect of the left lower lobe presumably associated focal pulmonary infarction. Associated small left pleural effusion. No evidence of associated right heart straining. Questionable focal low-density pulmonary embolus within the segmental pulmonary artery branch to the right middle lobe.   She has had CT abdomen pelvis with contrast done on Aug 19, 2017 image was independently reviewed by me.  Showed no acute intra-abdominal/pelvic process.  Mild amount of retained large bowel stool with mild colonic diverticulosis.  Bilateral lower lobe atelectasis and groundglass opacities which may be infectious or inflammatory.  Grade 1 L5-S1 anterolisthesis, bilateral L5 chronic pars interarticularis defects.  Severe left L5-S1 neuroforaminal narrowing  ASSESSMENT & PLAN:  1. Heterozygous factor V Leiden mutation ([place])   2. Chronic anticoagulation   3. History of pulmonary embolism   4. Rheumatoid arthritis involving multiple sites with positive rheumatoid factor ([place])    Possible was checked at last visit again.  Which showed negative panel except slightly elevated anti cardiolipin IgM at 15.  Repeat protein was 75%, low normal, protein S antigen at 52. Discussed with patient that the slight positive anti cardiolipin IgM does not meet criteria for Antiphospholipid syndrome  Discussed with patient  that we will continue Eliquis 5 mg twice daily for now. #Discussed with patient that nextplanon still contains estrogen and may increase her thrombosis risk, patient reports that this medication is not helping with endometriosis and she is about to follow-up with her gynecologist and talk about other options.    #Given her factor V Leiden heterozygous mutation status, still being on estrogen for treatment of endometriosis, appears to have flare of autoimmune disease/rheumatoid arthritis, I will continue her on Eliquis 5 mg twice daily for now.  Patient voices understanding.  #Patient appears to have 2 anxiety attacks during this visits.  Blood pressure was initially normal, dropped, recovered.  She feels better after drinking some juice, blood pressure stable.  She knows to call if symptoms worse.  Follow up  in 1 month.Earlie Server, MD, PhD Hematology Oncology [place] at [facility] Pager-, 09/08/2017

## 2017-09-22 HISTORY — PX: OTHER SURGICAL HISTORY: SHX169

## 2017-10-06 ENCOUNTER — Ambulatory Visit: Payer: BC Managed Care – PPO | Admitting: Oncology

## 2017-10-06 ENCOUNTER — Other Ambulatory Visit: Payer: BC Managed Care – PPO

## 2017-10-09 ENCOUNTER — Other Ambulatory Visit: Payer: Self-pay | Admitting: Oncology

## 2017-10-09 DIAGNOSIS — Z7901 Long term (current) use of anticoagulants: Secondary | ICD-10-CM

## 2017-10-13 ENCOUNTER — Inpatient Hospital Stay (HOSPITAL_BASED_OUTPATIENT_CLINIC_OR_DEPARTMENT_OTHER): Payer: BC Managed Care – PPO | Admitting: Oncology

## 2017-10-13 ENCOUNTER — Encounter: Payer: Self-pay | Admitting: Oncology

## 2017-10-13 ENCOUNTER — Inpatient Hospital Stay: Payer: BC Managed Care – PPO | Attending: Oncology

## 2017-10-13 VITALS — BP 126/87 | HR 74 | Temp 97.4°F | Resp 16 | Wt 240.5 lb

## 2017-10-13 DIAGNOSIS — I2699 Other pulmonary embolism without acute cor pulmonale: Secondary | ICD-10-CM | POA: Diagnosis not present

## 2017-10-13 DIAGNOSIS — M0579 Rheumatoid arthritis with rheumatoid factor of multiple sites without organ or systems involvement: Secondary | ICD-10-CM | POA: Diagnosis not present

## 2017-10-13 DIAGNOSIS — Z8041 Family history of malignant neoplasm of ovary: Secondary | ICD-10-CM | POA: Insufficient documentation

## 2017-10-13 DIAGNOSIS — Z7901 Long term (current) use of anticoagulants: Secondary | ICD-10-CM

## 2017-10-13 DIAGNOSIS — D6851 Activated protein C resistance: Secondary | ICD-10-CM | POA: Insufficient documentation

## 2017-10-13 LAB — COMPREHENSIVE METABOLIC PANEL
ALBUMIN: 3.8 g/dL (ref 3.5–5.0)
ALT: 17 U/L (ref 0–44)
ANION GAP: 8 (ref 5–15)
AST: 25 U/L (ref 15–41)
Alkaline Phosphatase: 47 U/L (ref 38–126)
BILIRUBIN TOTAL: 1 mg/dL (ref 0.3–1.2)
BUN: 10 mg/dL (ref 6–20)
CO2: 17 mmol/L — ABNORMAL LOW (ref 22–32)
Calcium: 8.8 mg/dL — ABNORMAL LOW (ref 8.9–10.3)
Chloride: 113 mmol/L — ABNORMAL HIGH (ref 98–111)
Creatinine, Ser: 1.05 mg/dL — ABNORMAL HIGH (ref 0.44–1.00)
GFR calc Af Amer: >60 mL/min (ref >60)
GLUCOSE: 98 mg/dL (ref 70–99)
Potassium: 3.8 mmol/L (ref 3.5–5.1)
Sodium: 138 mmol/L (ref 135–145)
TOTAL PROTEIN: 6.3 g/dL — AB (ref 6.5–8.1)

## 2017-10-13 LAB — CBC WITH DIFFERENTIAL/PLATELET
BASOS PCT: 2 %
Basophils Absolute: 0.1 10*3/uL (ref 0–0.1)
Eosinophils Absolute: 0.1 10*3/uL (ref 0–0.7)
Eosinophils Relative: 3 %
HEMATOCRIT: 41.4 % (ref 35.0–47.0)
HEMOGLOBIN: 14.4 g/dL (ref 12.0–16.0)
LYMPHS ABS: 0.8 10*3/uL — AB (ref 1.0–3.6)
LYMPHS PCT: 20 %
MCH: 32.4 pg (ref 26.0–34.0)
MCHC: 34.7 g/dL (ref 32.0–36.0)
MCV: 93.5 fL (ref 80.0–100.0)
MONO ABS: 0.5 10*3/uL (ref 0.2–0.9)
MONOS PCT: 11 %
NEUTROS ABS: 2.7 10*3/uL (ref 1.4–6.5)
NEUTROS PCT: 64 %
Platelets: 216 10*3/uL (ref 150–440)
RBC: 4.43 MIL/uL (ref 3.80–5.20)
RDW: 14.2 % (ref 11.5–14.5)
WBC: 4.2 10*3/uL (ref 3.6–11.0)

## 2017-10-13 NOTE — Progress Notes (Signed)
Hematology/Oncology follow-up [name]  Patient Care Team: Adin Hector, MD as PCP - General (Internal Medicine)  Reason for visit Follow-up for PE and low protein S level  Referring Physician: Dr.Fleming Herbon.  HISTORY OF PRESENTING ILLNESS:  Pamela Mills 37 y.o.  female with past medical history listed as below who  follow-up for management of anticoagulation due to recent PE. Since  November 2017, patient started to experience persistent dry cough, exertional shortness of breath, progressive swelling in hands and feet, she was referred to see rheumatology. Workup showed seropositive rheumatoid arthritis, Negative anti-CCP but elevated RF Patient was  Prednisone and later started on methotrexate weekly (started on 08/15/2016) for treatment.. Patient's respiratory symptoms improved and then worsen again. At that point she was referred by her primary care to see Dr. Raul Del pulmonary for evaluation. CT without contrast on 07/30/2016 showed basilar and apparent from predominant ground glass opacities in the architectural distortion, with possible minimum lower lobe bronchiectasis suggesting interstitial lung disease. There is also mild the bilateral axillary, subpectoral, supraclavicular, mediastinal adenopathy.lymph nodes are increased in number, mildly enlarged. Largest left axillary measuring 11 mm short axis. Findings favor reactive/systemic process with autoimmune disorders.  Patient's rheumatology symptoms improved with treatment. Patient had wax and wane of her respiratory symptoms. In the summer, she has had 2 round trips of 8 hour car ride back and forth to [place] where her father lives. Most recent one was on August 5. She has felt lower extremity swelling for which she has to put on stocking to reduce swelling. Also she felt a little stretchy feeling on her cough. She mentioned to Dr. Raul Del who ordered a follow-up CT scan. CT without contrast chest on  11/26/2016 showed patchy groundglass in the septal thickening in both lower lobes, less severe than 07/27/2016. There is new rounded consolidation in the left lower lobe, possible infectious etiology. No definitive evidence of interstitial lung disease. Meanwhile her respiratory symptoms has become worse. She has experienced coughing, exertional shortness of breath, and mild hemoptysis. This triggered a d-dimer testing which came back high. Patient had CT anginal chest PE protocol on 11/29/2016 which showed acute appearing nonconclusive pulmonary emboli within several segmental pulmonary artery branches to the left lower lobe. Small pleural-based consolidation with the posterior aspect of the left lower lobe presumably associated focal pulmonary infarction. Associated small left pleural effusion. No evidence of associated right heart straining. Questionable focal low-density pulmonary embolus within the segmental pulmonary artery branch to the right middle lobe.   lower extremity ultrasound revealed no DVT within the right lower extremity. Patient was started on Eliquis 5 mg twice a day.  Patient has had some rheumatology/hypercoagulable workup done with [facility]. Reviewing her labs showed on 08/08/2016, she is tested negative for anti-Cardiolite pain antibody IgG < 9, IgM slightly elevated at 15. Beta-2 glycoprotein IgG and IgM normal, normal axial coronal phase phospholipid, DVvT normal, negative lupus anticoagulant.. Serum protein electrophoresis negative for M spike.  11/29/2016, homocystine 8, lupus anticoagulant was retested was negative, mildly low protein S activity at 48, normal protein S antigen. Slightly low protein S antigen. Normal anti-thrombin function. She is tested positive for single R506Q mutation heterozygous factor V Leiden mutation.  Patient has a family history of ovarian cancer in her mom. She reports that she was tested negative for genetic test.  She takes OCP since she was  teenage.  This is her fist episode of blood clot. She was taking of OCP and  referred to see gynecology.  # 06/13/2017 Antiphospholipid panel was checked at last visit again.  Which showed negative panel except slightly elevated anti cardiolipin IgM at 15.  Repeat protein S activity was 75%, protein S antigen at 52, mildly decreased.  slight positive anti cardiolipin IgM does not meet criteria for Antiphospholipid syndrome  INTERVAL HISTORY Pamela Mills is a 37 y.o. female who has above history reviewed by me today presents for follow-up management of pulmonary embolism. She continues on Eliquis 5 mg twice daily During last clinic visit, patient had a 2 anxiety episodes during which she became hypotensive and tachycardic.  Symptom improved and the patient went home.  Patient reports that during the interval, she has been doing satisfactory, no additional episodes of anxiety.  She was also seen by GYN Dr.Ward and she has had pelvic ultrasound which showed ovarian cysts, which may be contributing to her left lower quadrant pain.. nextplanon has been taken out.  # Chronic left lower quadrant pain, resolved.   #Joint pain, stable.  Patient was recently started on Suldasalazine for rheumatoid arthritis. Fatigue: chronic stable.   ROS:  Review of Systems  Constitutional: Positive for fatigue. Negative for diaphoresis and fever.  HENT:   Negative for nosebleeds, sore throat and tinnitus.   Eyes: Negative for icterus.  Respiratory: Negative for chest tightness.   Cardiovascular: Negative for chest pain, leg swelling and palpitations.  Gastrointestinal: Negative for abdominal distention, abdominal pain, blood in stool, constipation and vomiting.  Endocrine: Negative for hot flashes.  Genitourinary: Negative for difficulty urinating, frequency, hematuria and nocturia.   Musculoskeletal: Positive for back pain. Negative for arthralgias, myalgias and neck pain.  Skin: Negative for itching and rash.    Neurological: Negative for dizziness and light-headedness.  Hematological: Negative for adenopathy.  Psychiatric/Behavioral: Negative for confusion and decreased concentration. The patient is not nervous/anxious.     MEDICAL HISTORY:  Past Medical History:  Diagnosis Date  . Anxiety   . Arthritis   . Cancer ([place])   . Collagen vascular disease ([place])   . Depression   . Diverticulitis   . Migraines   . Pulmonary embolism ([place])     SURGICAL HISTORY: Past Surgical History:  Procedure Laterality Date  . CHOLECYSTECTOMY N/A 06/29/2015   Procedure: LAPAROSCOPIC CHOLECYSTECTOMY;  Surgeon: Stark Klein, MD;  Location: [facility];  Service: General;  Laterality: N/A;  . DILATION AND EVACUATION N/A 11/10/2014   Procedure: DILATATION AND EVACUATION;  Surgeon: Honor Loh Ward, MD;  Location: [facility];  Service: Gynecology;  Laterality: N/A;  . FOOT SURGERY Left 01/2012  . LAPAROSCOPIC ENDOMETRIOSIS FULGURATION  2006  . UPPER GI ENDOSCOPY      SOCIAL HISTORY: Social History   Socioeconomic History  . Marital status: Married    Spouse name: Not on file  . Number of children: Not on file  . Years of education: Not on file  . Highest education level: Not on file  Occupational History  . Not on file  Social Needs  . Financial resource strain: Not on file  . Food insecurity:    Worry: Not on file    Inability: Not on file  . Transportation needs:    Medical: Not on file    Non-medical: Not on file  Tobacco Use  . Smoking status: Never Smoker  . Smokeless tobacco: Never Used  Substance and Sexual Activity  . Alcohol use: No  . Drug use: No  . Sexual activity: Not Currently  Lifestyle  .  Physical activity:    Days per week: Not on file    Minutes per session: Not on file  . Stress: Not on file  Relationships  . Social connections:    Talks on phone: Not on file    Gets together: Not on file    Attends religious service: Not on file    Active member of club or  organization: Not on file    Attends meetings of clubs or organizations: Not on file    Relationship status: Not on file  . Intimate partner violence:    Fear of current or ex partner: Not on file    Emotionally abused: Not on file    Physically abused: Not on file    Forced sexual activity: Not on file  Other Topics Concern  . Not on file  Social History Narrative  . Not on file    FAMILY HISTORY: Family History  Problem Relation Age of Onset  . Ovarian cancer Mother 65  . Rheumatologic disease Maternal Aunt   . Pancreatic cancer Maternal Grandfather   . Colon cancer Paternal Grandmother   . Breast cancer Neg Hx     ALLERGIES:  is allergic to other and gluten meal.  MEDICATIONS:  Current Outpatient Medications  Medication Sig Dispense Refill  . apixaban (ELIQUIS) 5 MG TABS tablet Take 5 mg by mouth 2 (two) times daily.    Marland Kitchen azelastine (ASTELIN) 0.1 % nasal spray Place 1 spray into both nostrils 2 (two) times daily. Use in each nostril as directed    . cetirizine (ZYRTEC) 10 MG tablet Take 10 mg by mouth daily.    . Cholecalciferol (VITAMIN D3) 1000 units CAPS Take 1,000 Units by mouth daily.    Marland Kitchen escitalopram (LEXAPRO) 10 MG tablet Take 10 mg by mouth every evening.     . folic acid (FOLVITE) 1 MG tablet Take 1 mg by mouth daily.    Marland Kitchen LORazepam (ATIVAN) 0.5 MG tablet Take 0.5 mg by mouth as needed for anxiety.    . OMEPRAZOLE PO Take 20 mg by mouth daily.    Marland Kitchen sulfaSALAzine (AZULFIDINE) 500 MG EC tablet Take 500 mg by mouth 2 (two) times daily.  0  . SUMAtriptan (IMITREX) 50 MG tablet Take 50 mg by mouth as needed.    . topiramate (TOPAMAX) 100 MG tablet Take 100 mg by mouth at bedtime.     . triamcinolone (NASACORT ALLERGY 24HR) 55 MCG/ACT AERO nasal inhaler Place 2 sprays into the nose daily.    . vitamin B-12 (CYANOCOBALAMIN) 1000 MCG tablet Take 1,000 mcg by mouth daily.     No current facility-administered medications for this visit.       Marland Kitchen  PHYSICAL  EXAMINATION: ECOG PERFORMANCE STATUS: 0 - Asymptomatic Vitals:   10/13/17 1007  BP: 126/87  Pulse: 74  Resp: 16  Temp: (!) 97.4 F (36.3 C)   Filed Weights   10/13/17 1007  Weight: 240 lb 8 oz (109.1 kg)   Physical Exam  Constitutional: She is oriented to person, place, and time and well-developed, well-nourished, and in no distress. No distress.  HENT:  Head: Normocephalic and atraumatic.  Nose: Nose normal.  Mouth/Throat: Oropharynx is clear and moist. No oropharyngeal exudate.  Eyes: Pupils are equal, round, and reactive to light. Conjunctivae and EOM are normal. Left eye exhibits no discharge. No scleral icterus.  Neck: Normal range of motion. Neck supple.  Cardiovascular: Normal rate, regular rhythm and normal heart sounds.  No murmur heard.  Pulmonary/Chest: Effort normal. No respiratory distress. She has no wheezes. She has no rales. She exhibits no tenderness.  Decreased breath laterally at the base.  Abdominal: Soft. Bowel sounds are normal. She exhibits no distension and no mass. There is no tenderness. There is no rebound.  Musculoskeletal: Normal range of motion. She exhibits no edema.  Lymphadenopathy:    She has no cervical adenopathy.  Neurological: She is alert and oriented to person, place, and time. No cranial nerve deficit. She exhibits normal muscle tone. Coordination normal.  Skin: Skin is warm and dry. She is not diaphoretic. No erythema.  Psychiatric: Affect normal.      LABORATORY DATA:  I have reviewed the data as listed Patient has had some rheumatology/hypercoagulable workup done with [facility]. Reviewing her labs showed on 08/08/2016, she is tested negative for anti-Cardiolite pain antibody IgG < 9, IgM slightly elevated at 15. Beta-2 glycoprotein IgG and IgM normal, normal axial coronal phase phospholipid, DVvT normal, negative lupus anticoagulant.. Serum protein electrophoresis negative for M spike. Onto ferritin negative. 11/29/2016,  homocystine 8, lupus anticoagulant was retested was negative, mildly low protein S activity at 48, normal protein S antigen. Slightly low protein S antigen. Normal anti-thrombin function. She is tested positive for single R506Q mutation heterozygous factor V Leiden mutation.  RADIOGRAPHIC STUDIES: I have personally reviewed the radiological images as listed and agreed with the findings in the report. CT without contrast on 07/30/2016 showed basilar and apparent from predominant ground glass opacities in the architectural distortion, with possible minimum lower lobe bronchiectasis suggesting interstitial lung disease. There is also mild the bilateral axillary, subpectoral, supraclavicular, mediastinal adenopathy.lymph nodes are increased in number, mildly enlarged. Largest left axillary measuring 11 mm short axis. Findings favor reactive/systemic process with autoimmune disorders.   Patient had CT anginal chest PE protocol on 11/29/2016 which showed acute appearing nonconclusive pulmonary emboli within several segmental pulmonary artery branches to the left lower lobe. Small pleural-based consolidation with the posterior aspect of the left lower lobe presumably associated focal pulmonary infarction. Associated small left pleural effusion. No evidence of associated right heart straining. Questionable focal low-density pulmonary embolus within the segmental pulmonary artery branch to the right middle lobe.   She has had CT abdomen pelvis with contrast done on Aug 19, 2017 image was independently reviewed by me.  Showed no acute intra-abdominal/pelvic process.  Mild amount of retained large bowel stool with mild colonic diverticulosis.  Bilateral lower lobe atelectasis and groundglass opacities which may be infectious or inflammatory.  Grade 1 L5-S1 anterolisthesis, bilateral L5 chronic pars interarticularis defects.  Severe left L5-S1 neuroforaminal narrowing  ASSESSMENT & PLAN:  1. Chronic anticoagulation     2. Rheumatoid arthritis involving multiple sites with positive rheumatoid factor (HCC)   3. Heterozygous factor V Leiden mutation ([place])   4. Pulmonary embolism without acute cor pulmonale, unspecified chronicity, unspecified pulmonary embolism type ([place])     #Patient discussed with patient that the her PE is most likely provoked by her autoimmune/rheumatology problem. repeat CBC, CMP, d-dimer, free and total protein S level in 3 months. Follow-up for assessment after 1 year anticoagulation with Eliquis 5 mg twice daily. Planning switching to Eliquis 2.5 mg twice daily.  #Heterozygous factor V Leiden mutation, does not change her anticoagulation management plan. #Expresses her desire of getting pregnant in the future once her rheumatoid arthritis being controlled. Discussed with patient that she will needs peripartum anticoagulation.  We will discuss if she needs. Follow up in 3 months Orders  Placed This Encounter  Procedures  . CBC with Differential/Platelet    Standing Status:   Future    Standing Expiration Date:   10/14/2018  . Comprehensive metabolic panel    Standing Status:   Future    Standing Expiration Date:   10/14/2018  . Fibrin derivatives [facility])    Standing Status:   Future    Standing Expiration Date:   10/14/2018  . Protein S, total and free    Standing Status:   Future    Standing Expiration Date:   10/14/2018    Earlie Server, MD, PhD Hematology Oncology [place] [facility] Pager-, 10/13/2017

## 2017-10-17 ENCOUNTER — Ambulatory Visit
Admission: RE | Admit: 2017-10-17 | Discharge: 2017-10-17 | Disposition: A | Discharge status: Home / Self Care | Payer: BC Managed Care – PPO | Source: Ambulatory Visit | Attending: Obstetrics & Gynecology | Admitting: Obstetrics & Gynecology

## 2017-10-17 ENCOUNTER — Ambulatory Visit: Payer: BC Managed Care – PPO | Admitting: Certified Registered"

## 2017-10-17 ENCOUNTER — Encounter
Admission: RE | Disposition: A | Discharge status: Home / Self Care | Payer: Self-pay | Source: Ambulatory Visit | Attending: Obstetrics & Gynecology

## 2017-10-17 ENCOUNTER — Other Ambulatory Visit: Payer: Self-pay

## 2017-10-17 ENCOUNTER — Encounter: Payer: Self-pay | Admitting: Emergency Medicine

## 2017-10-17 DIAGNOSIS — Z8041 Family history of malignant neoplasm of ovary: Secondary | ICD-10-CM | POA: Insufficient documentation

## 2017-10-17 DIAGNOSIS — N838 Other noninflammatory disorders of ovary, fallopian tube and broad ligament: Secondary | ICD-10-CM | POA: Diagnosis not present

## 2017-10-17 DIAGNOSIS — F419 Anxiety disorder, unspecified: Secondary | ICD-10-CM | POA: Diagnosis not present

## 2017-10-17 DIAGNOSIS — F329 Major depressive disorder, single episode, unspecified: Secondary | ICD-10-CM | POA: Insufficient documentation

## 2017-10-17 DIAGNOSIS — Z888 Allergy status to other drugs, medicaments and biological substances status: Secondary | ICD-10-CM | POA: Diagnosis not present

## 2017-10-17 DIAGNOSIS — D6851 Activated protein C resistance: Secondary | ICD-10-CM | POA: Insufficient documentation

## 2017-10-17 DIAGNOSIS — Z7901 Long term (current) use of anticoagulants: Secondary | ICD-10-CM | POA: Diagnosis not present

## 2017-10-17 DIAGNOSIS — M359 Systemic involvement of connective tissue, unspecified: Secondary | ICD-10-CM | POA: Diagnosis not present

## 2017-10-17 DIAGNOSIS — N8302 Follicular cyst of left ovary: Secondary | ICD-10-CM | POA: Insufficient documentation

## 2017-10-17 DIAGNOSIS — D271 Benign neoplasm of left ovary: Secondary | ICD-10-CM | POA: Insufficient documentation

## 2017-10-17 DIAGNOSIS — N803 Endometriosis of pelvic peritoneum: Secondary | ICD-10-CM | POA: Diagnosis not present

## 2017-10-17 DIAGNOSIS — Z85828 Personal history of other malignant neoplasm of skin: Secondary | ICD-10-CM | POA: Insufficient documentation

## 2017-10-17 DIAGNOSIS — M1990 Unspecified osteoarthritis, unspecified site: Secondary | ICD-10-CM | POA: Diagnosis not present

## 2017-10-17 DIAGNOSIS — Z79899 Other long term (current) drug therapy: Secondary | ICD-10-CM | POA: Diagnosis not present

## 2017-10-17 DIAGNOSIS — Z86711 Personal history of pulmonary embolism: Secondary | ICD-10-CM | POA: Insufficient documentation

## 2017-10-17 DIAGNOSIS — Z800 Family history of malignant neoplasm of digestive organs: Secondary | ICD-10-CM | POA: Diagnosis not present

## 2017-10-17 DIAGNOSIS — Z6838 Body mass index (BMI) 38.0-38.9, adult: Secondary | ICD-10-CM | POA: Insufficient documentation

## 2017-10-17 DIAGNOSIS — E669 Obesity, unspecified: Secondary | ICD-10-CM | POA: Insufficient documentation

## 2017-10-17 DIAGNOSIS — M059 Rheumatoid arthritis with rheumatoid factor, unspecified: Secondary | ICD-10-CM | POA: Insufficient documentation

## 2017-10-17 DIAGNOSIS — N83202 Unspecified ovarian cyst, left side: Secondary | ICD-10-CM | POA: Diagnosis present

## 2017-10-17 HISTORY — PX: CHROMOPERTUBATION: SHX6288

## 2017-10-17 HISTORY — PX: LAPAROSCOPY: SHX197

## 2017-10-17 HISTORY — PX: LAPAROSCOPIC UNILATERAL SALPINGO OOPHERECTOMY: SHX5935

## 2017-10-17 LAB — TYPE AND SCREEN
ABO/RH(D): A POS
ANTIBODY SCREEN: POSITIVE

## 2017-10-17 LAB — POCT PREGNANCY, URINE: Preg Test, Ur: NEGATIVE

## 2017-10-17 SURGERY — LAPAROSCOPY OPERATIVE
Anesthesia: General | Laterality: Right | Wound class: Clean Contaminated

## 2017-10-17 MED ORDER — LACTATED RINGERS IV SOLN: INTRAVENOUS | Status: DC

## 2017-10-17 MED ORDER — OXYCODONE HCL 5 MG PO TABS: 5.0000 mg | ORAL_TABLET | Freq: Three times a day (TID) | ORAL | 0 refills | Status: DC | PRN

## 2017-10-17 MED ORDER — ACETAMINOPHEN NICU IV SYRINGE 10 MG/ML
INTRAVENOUS | Status: AC
  Filled 2017-10-17: qty 1

## 2017-10-17 MED ORDER — SUGAMMADEX SODIUM 500 MG/5ML IV SOLN
INTRAVENOUS | Status: AC
  Filled 2017-10-17: qty 5

## 2017-10-17 MED ORDER — HYDROMORPHONE HCL 1 MG/ML IJ SOLN
INTRAMUSCULAR | Status: DC | PRN
  Administered 2017-10-17: 1 mg via INTRAVENOUS

## 2017-10-17 MED ORDER — ROCURONIUM BROMIDE 100 MG/10ML IV SOLN
INTRAVENOUS | Status: AC
  Filled 2017-10-17: qty 1

## 2017-10-17 MED ORDER — PROPOFOL 10 MG/ML IV BOLUS
INTRAVENOUS | Status: AC
  Filled 2017-10-17: qty 40

## 2017-10-17 MED ORDER — MORPHINE SULFATE (PF) 4 MG/ML IV SOLN: 1.0000 mg | INTRAVENOUS | Status: DC | PRN

## 2017-10-17 MED ORDER — IBUPROFEN 800 MG PO TABS: 800.0000 mg | ORAL_TABLET | Freq: Three times a day (TID) | ORAL | 1 refills | Status: DC | PRN

## 2017-10-17 MED ORDER — CELECOXIB 200 MG PO CAPS
400.0000 mg | ORAL_CAPSULE | Freq: Once | ORAL | Status: AC
  Administered 2017-10-17: 400 mg via ORAL

## 2017-10-17 MED ORDER — DEXAMETHASONE SODIUM PHOSPHATE 10 MG/ML IJ SOLN
INTRAMUSCULAR | Status: AC
  Filled 2017-10-17: qty 1

## 2017-10-17 MED ORDER — ACETAMINOPHEN 325 MG PO TABS: 650.0000 mg | ORAL_TABLET | ORAL | Status: DC | PRN

## 2017-10-17 MED ORDER — KETAMINE HCL 10 MG/ML IJ SOLN
INTRAMUSCULAR | Status: DC | PRN
  Administered 2017-10-17: 30 mg via INTRAVENOUS
  Administered 2017-10-17: 20 mg via INTRAVENOUS

## 2017-10-17 MED ORDER — LACTATED RINGERS IV SOLN
INTRAVENOUS | Status: DC
  Administered 2017-10-17 (×2): via INTRAVENOUS

## 2017-10-17 MED ORDER — GLYCOPYRROLATE 0.2 MG/ML IJ SOLN
INTRAMUSCULAR | Status: AC
  Filled 2017-10-17: qty 1

## 2017-10-17 MED ORDER — PHENYLEPHRINE HCL 10 MG/ML IJ SOLN
INTRAMUSCULAR | Status: DC | PRN
  Administered 2017-10-17 (×2): 100 ug via INTRAVENOUS
  Administered 2017-10-17: 200 ug via INTRAVENOUS
  Administered 2017-10-17 (×2): 100 ug via INTRAVENOUS
  Administered 2017-10-17 (×2): 200 ug via INTRAVENOUS

## 2017-10-17 MED ORDER — HYDROMORPHONE HCL 1 MG/ML IJ SOLN
INTRAMUSCULAR | Status: AC
  Filled 2017-10-17: qty 1

## 2017-10-17 MED ORDER — FAMOTIDINE 20 MG PO TABS
ORAL_TABLET | ORAL | Status: AC
  Administered 2017-10-17: 20 mg via ORAL
  Filled 2017-10-17: qty 1

## 2017-10-17 MED ORDER — EPHEDRINE SULFATE 50 MG/ML IJ SOLN
INTRAMUSCULAR | Status: DC | PRN
  Administered 2017-10-17 (×4): 10 mg via INTRAVENOUS

## 2017-10-17 MED ORDER — KETOROLAC TROMETHAMINE 30 MG/ML IJ SOLN
INTRAMUSCULAR | Status: DC | PRN
  Administered 2017-10-17: 30 mg via INTRAVENOUS

## 2017-10-17 MED ORDER — MIDAZOLAM HCL 5 MG/5ML IJ SOLN
INTRAMUSCULAR | Status: AC
  Filled 2017-10-17: qty 5

## 2017-10-17 MED ORDER — FENTANYL CITRATE (PF) 100 MCG/2ML IJ SOLN
25.0000 ug | INTRAMUSCULAR | Status: DC | PRN
  Administered 2017-10-17 (×2): 25 ug via INTRAVENOUS

## 2017-10-17 MED ORDER — KETOROLAC TROMETHAMINE 30 MG/ML IJ SOLN
30.0000 mg | Freq: Four times a day (QID) | INTRAMUSCULAR | Status: DC
  Filled 2017-10-17: qty 1

## 2017-10-17 MED ORDER — ONDANSETRON HCL 4 MG/2ML IJ SOLN
INTRAMUSCULAR | Status: DC | PRN
  Administered 2017-10-17: 4 mg via INTRAVENOUS

## 2017-10-17 MED ORDER — EPHEDRINE SULFATE 50 MG/ML IJ SOLN
INTRAMUSCULAR | Status: AC
  Filled 2017-10-17: qty 1

## 2017-10-17 MED ORDER — HEPARIN SODIUM (PORCINE) 5000 UNIT/ML IJ SOLN
INTRAMUSCULAR | Status: AC
  Administered 2017-10-17: 5000 [IU] via SUBCUTANEOUS
  Filled 2017-10-17: qty 1

## 2017-10-17 MED ORDER — DEXAMETHASONE SODIUM PHOSPHATE 10 MG/ML IJ SOLN
INTRAMUSCULAR | Status: DC | PRN
  Administered 2017-10-17: 10 mg via INTRAVENOUS

## 2017-10-17 MED ORDER — GABAPENTIN 300 MG PO CAPS
ORAL_CAPSULE | ORAL | Status: AC
  Administered 2017-10-17: 600 mg via ORAL
  Filled 2017-10-17: qty 2

## 2017-10-17 MED ORDER — LIDOCAINE HCL (PF) 2 % IJ SOLN
INTRAMUSCULAR | Status: AC
  Filled 2017-10-17: qty 10

## 2017-10-17 MED ORDER — CELECOXIB 200 MG PO CAPS
ORAL_CAPSULE | ORAL | Status: AC
  Administered 2017-10-17: 400 mg via ORAL
  Filled 2017-10-17: qty 2

## 2017-10-17 MED ORDER — FENTANYL CITRATE (PF) 250 MCG/5ML IJ SOLN
INTRAMUSCULAR | Status: AC
  Filled 2017-10-17: qty 5

## 2017-10-17 MED ORDER — KETOROLAC TROMETHAMINE 30 MG/ML IJ SOLN
INTRAMUSCULAR | Status: AC
  Filled 2017-10-17: qty 1

## 2017-10-17 MED ORDER — FENTANYL CITRATE (PF) 100 MCG/2ML IJ SOLN
INTRAMUSCULAR | Status: AC
  Administered 2017-10-17: 25 ug via INTRAVENOUS
  Filled 2017-10-17: qty 2

## 2017-10-17 MED ORDER — PROPOFOL 10 MG/ML IV BOLUS
INTRAVENOUS | Status: DC | PRN
  Administered 2017-10-17: 150 mg via INTRAVENOUS
  Administered 2017-10-17: 50 mg via INTRAVENOUS

## 2017-10-17 MED ORDER — GABAPENTIN 300 MG PO CAPS
600.0000 mg | ORAL_CAPSULE | Freq: Once | ORAL | Status: AC
  Administered 2017-10-17: 600 mg via ORAL

## 2017-10-17 MED ORDER — ACETAMINOPHEN 650 MG RE SUPP
650.0000 mg | RECTAL | Status: DC | PRN
  Filled 2017-10-17: qty 1

## 2017-10-17 MED ORDER — FENTANYL CITRATE (PF) 100 MCG/2ML IJ SOLN
INTRAMUSCULAR | Status: DC | PRN
  Administered 2017-10-17 (×2): 100 ug via INTRAVENOUS
  Administered 2017-10-17: 50 ug via INTRAVENOUS

## 2017-10-17 MED ORDER — PHENYLEPHRINE HCL 10 MG/ML IJ SOLN
INTRAMUSCULAR | Status: AC
  Filled 2017-10-17: qty 1

## 2017-10-17 MED ORDER — ACETAMINOPHEN 500 MG PO TABS
ORAL_TABLET | ORAL | Status: AC
  Administered 2017-10-17: 1000 mg via ORAL
  Filled 2017-10-17: qty 2

## 2017-10-17 MED ORDER — ACETAMINOPHEN 500 MG PO TABS
1000.0000 mg | ORAL_TABLET | Freq: Once | ORAL | Status: AC
  Administered 2017-10-17: 1000 mg via ORAL

## 2017-10-17 MED ORDER — ROCURONIUM BROMIDE 100 MG/10ML IV SOLN
INTRAVENOUS | Status: DC | PRN
  Administered 2017-10-17: 20 mg via INTRAVENOUS
  Administered 2017-10-17: 50 mg via INTRAVENOUS
  Administered 2017-10-17: 10 mg via INTRAVENOUS

## 2017-10-17 MED ORDER — LIDOCAINE HCL (CARDIAC) PF 100 MG/5ML IV SOSY
PREFILLED_SYRINGE | INTRAVENOUS | Status: DC | PRN
  Administered 2017-10-17: 50 mg via INTRAVENOUS

## 2017-10-17 MED ORDER — GLYCOPYRROLATE 0.2 MG/ML IJ SOLN
INTRAMUSCULAR | Status: DC | PRN
  Administered 2017-10-17: 0.1 mg via INTRAVENOUS

## 2017-10-17 MED ORDER — STERILE WATER FOR IRRIGATION IR SOLN
Status: DC | PRN
  Administered 2017-10-17: 10 mL

## 2017-10-17 MED ORDER — KETAMINE HCL 50 MG/ML IJ SOLN
INTRAMUSCULAR | Status: AC
  Filled 2017-10-17: qty 10

## 2017-10-17 MED ORDER — HEPARIN SODIUM (PORCINE) 5000 UNIT/ML IJ SOLN
5000.0000 [IU] | Freq: Once | INTRAMUSCULAR | Status: AC
  Administered 2017-10-17: 5000 [IU] via SUBCUTANEOUS

## 2017-10-17 MED ORDER — SUGAMMADEX SODIUM 200 MG/2ML IV SOLN
INTRAVENOUS | Status: DC | PRN
  Administered 2017-10-17: 250 mg via INTRAVENOUS

## 2017-10-17 MED ORDER — MIDAZOLAM HCL 2 MG/2ML IJ SOLN
INTRAMUSCULAR | Status: DC | PRN
  Administered 2017-10-17: 3 mg via INTRAVENOUS
  Administered 2017-10-17: 2 mg via INTRAVENOUS

## 2017-10-17 MED ORDER — METHYLENE BLUE 0.5 % INJ SOLN
INTRAVENOUS | Status: AC
  Filled 2017-10-17: qty 10

## 2017-10-17 MED ORDER — ONDANSETRON HCL 4 MG/2ML IJ SOLN: 4.0000 mg | Freq: Once | INTRAMUSCULAR | Status: DC | PRN

## 2017-10-17 MED ORDER — ONDANSETRON HCL 4 MG/2ML IJ SOLN
INTRAMUSCULAR | Status: AC
  Filled 2017-10-17: qty 2

## 2017-10-17 MED ORDER — OXYCODONE HCL 5 MG PO TABS
ORAL_TABLET | ORAL | Status: AC
  Filled 2017-10-17: qty 1

## 2017-10-17 MED ORDER — FAMOTIDINE 20 MG PO TABS
20.0000 mg | ORAL_TABLET | Freq: Once | ORAL | Status: AC
  Administered 2017-10-17: 20 mg via ORAL

## 2017-10-17 SURGICAL SUPPLY — 59 items
BACTOSHIELD CHG 4% 4OZ (MISCELLANEOUS) ×1
BAG URINE DRAINAGE (UROLOGICAL SUPPLIES) ×5 IMPLANT
BLADE SURG SZ11 CARB STEEL (BLADE) ×5 IMPLANT
CANISTER SUCT 1200ML W/VALVE (MISCELLANEOUS) ×5 IMPLANT
CATH FOLEY 2WAY  5CC 16FR (CATHETERS) ×2
CATH URTH 16FR FL 2W BLN LF (CATHETERS) ×3 IMPLANT
CHLORAPREP W/TINT 26ML (MISCELLANEOUS) ×10 IMPLANT
DEFOGGER SCOPE WARMER CLEARIFY (MISCELLANEOUS) ×5 IMPLANT
DERMABOND ADVANCED (GAUZE/BANDAGES/DRESSINGS) ×2
DERMABOND ADVANCED .7 DNX12 (GAUZE/BANDAGES/DRESSINGS) ×3 IMPLANT
DEVICE SUTURE ENDOST 10MM (ENDOMECHANICALS) IMPLANT
DRAPE LEGGINS SURG 28X43 STRL (DRAPES) ×5 IMPLANT
DRAPE SHEET LG 3/4 BI-LAMINATE (DRAPES) ×5 IMPLANT
DRAPE UNDER BUTTOCK W/FLU (DRAPES) ×5 IMPLANT
ELECT REM PT RETURN 9FT ADLT (ELECTROSURGICAL) ×5
ELECTRODE REM PT RTRN 9FT ADLT (ELECTROSURGICAL) ×3 IMPLANT
GLOVE PI ORTHOPRO 6.5 (GLOVE) ×2
GLOVE PI ORTHOPRO STRL 6.5 (GLOVE) ×3 IMPLANT
GLOVE SURG SYN 6.5 ES PF (GLOVE) ×15 IMPLANT
GOWN STRL REUS W/ TWL LRG LVL3 (GOWN DISPOSABLE) ×9 IMPLANT
GOWN STRL REUS W/ TWL XL LVL3 (GOWN DISPOSABLE) ×3 IMPLANT
GOWN STRL REUS W/TWL LRG LVL3 (GOWN DISPOSABLE) ×6
GOWN STRL REUS W/TWL XL LVL3 (GOWN DISPOSABLE) ×2
GRASPER SUT TROCAR 14GX15 (MISCELLANEOUS) ×5 IMPLANT
IRRIGATION STRYKERFLOW (MISCELLANEOUS) ×3 IMPLANT
IRRIGATOR STRYKERFLOW (MISCELLANEOUS) ×5
IV LACTATED RINGERS 1000ML (IV SOLUTION) ×5 IMPLANT
KIT PINK PAD W/HEAD ARE REST (MISCELLANEOUS) ×5
KIT PINK PAD W/HEAD ARM REST (MISCELLANEOUS) ×3 IMPLANT
KIT TURNOVER CYSTO (KITS) ×5 IMPLANT
L-HOOK LAP DISP 36CM (ELECTROSURGICAL) ×10
LABEL OR SOLS (LABEL) IMPLANT
LHOOK LAP DISP 36CM (ELECTROSURGICAL) ×6 IMPLANT
LIGASURE VESSEL 5MM BLUNT TIP (ELECTROSURGICAL) ×5 IMPLANT
MANIPULATOR VCARE LG CRV RETR (MISCELLANEOUS) IMPLANT
MANIPULATOR VCARE SML CRV RETR (MISCELLANEOUS) IMPLANT
MANIPULATOR VCARE STD CRV RETR (MISCELLANEOUS) IMPLANT
NS IRRIG 500ML POUR BTL (IV SOLUTION) ×5 IMPLANT
PACK LAP CHOLECYSTECTOMY (MISCELLANEOUS) ×5 IMPLANT
PAD OB MATERNITY 4.3X12.25 (PERSONAL CARE ITEMS) ×5 IMPLANT
PAD PREP 24X41 OB/GYN DISP (PERSONAL CARE ITEMS) ×5 IMPLANT
PENCIL ELECTRO HAND CTR (MISCELLANEOUS) ×5 IMPLANT
SCRUB CHG 4% DYNA-HEX 4OZ (MISCELLANEOUS) ×4 IMPLANT
SET CYSTO W/LG BORE CLAMP LF (SET/KITS/TRAYS/PACK) IMPLANT
SET YANKAUER POOLE SUCT (MISCELLANEOUS) IMPLANT
SLEEVE ENDOPATH XCEL 5M (ENDOMECHANICALS) ×10 IMPLANT
SURGILUBE 2OZ TUBE FLIPTOP (MISCELLANEOUS) ×5 IMPLANT
SUT ENDO VLOC 180-0-8IN (SUTURE) IMPLANT
SUT MNCRL 4-0 (SUTURE) ×2
SUT MNCRL 4-0 27XMFL (SUTURE) ×3
SUT MNCRL AB 4-0 PS2 18 (SUTURE) IMPLANT
SUT VIC AB 0 CT1 36 (SUTURE) IMPLANT
SUT VICRYL 0 AB UR-6 (SUTURE) ×10 IMPLANT
SUT VICRYL 0 UR6 27IN ABS (SUTURE) ×5 IMPLANT
SUTURE MNCRL 4-0 27XMF (SUTURE) ×3 IMPLANT
TROCAR ENDO BLADELESS 11MM (ENDOMECHANICALS) ×5 IMPLANT
TROCAR XCEL NON-BLD 5MMX100MML (ENDOMECHANICALS) ×5 IMPLANT
TUBING INSUF HEATED (TUBING) ×5 IMPLANT
TUBING INSUFFLATION (TUBING) ×5 IMPLANT

## 2017-10-17 NOTE — Anesthesia Preprocedure Evaluation (Signed)
Anesthesia Evaluation  Patient identified by MRN, date of birth, ID band Patient awake    Reviewed: Allergy & Precautions, NPO status , Patient's Chart, lab work & pertinent test results  Airway Mallampati: II   Neck ROM: full    Dental   Pulmonary neg pulmonary ROS, PE   breath sounds clear to auscultation       Cardiovascular negative cardio ROS   Rhythm:regular Rate:Normal     Neuro/Psych  Headaches, PSYCHIATRIC DISORDERS Anxiety Depression    GI/Hepatic negative GI ROS, Neg liver ROS,   Endo/Other  obese  Renal/GU negative Renal ROS  Female GU complaint     Musculoskeletal  (+) Arthritis ,   Abdominal   Peds negative pediatric ROS (+)  Hematology  (+) Blood dyscrasia, ,   Anesthesia Other Findings   Reproductive/Obstetrics                             Anesthesia Physical  Anesthesia Plan  ASA: II  Anesthesia Plan: General   Post-op Pain Management:    Induction: Intravenous  PONV Risk Score and Plan:   Airway Management Planned: Oral ETT  Additional Equipment:   Intra-op Plan:   Post-operative Plan: Extubation in OR  Informed Consent: I have reviewed the patients History and Physical, chart, labs and discussed the procedure including the risks, benefits and alternatives for the proposed anesthesia with the patient or authorized representative who has indicated his/her understanding and acceptance.     Plan Discussed with: CRNA, Anesthesiologist and Surgeon  Anesthesia Plan Comments:         Anesthesia Quick Evaluation

## 2017-10-17 NOTE — Op Note (Signed)
[name] PROCEDURE DATE: 10/17/2017  PATIENT:  Pamela Mills  37 y.o. female  PRE-OPERATIVE DIAGNOSIS:  ovarian cyst, endometriosis  POST-OPERATIVE DIAGNOSIS:  ovarian cyst, endometriosis  PROCEDURE:  Procedure(s): LAPAROSCOPY OPERATIVE, PERITOEAL STRIPPING (N/A) LAPAROSCOPIC UNILATERAL SALPINGO OOPHORECTOMY (Left) CHROMOPERTUBATION (Right)  SURGEON:  Surgeon(s) and Role:    * Dorothie Wah, Honor Loh, MD - Primary  ANESTHESIA:  General via ET  I/O  Total I/O In: 1600 [I.V.:1600] Out: 650 [Urine:600; Blood:50]  FINDINGS:  Small uterus, normal RIGHT ovary and patent fallopian tube.  Normal upper abdomen.  LEFT complex ovary, with large 5-6cm blood-filled cyst and smaller solid cyst, dilated and adherent left fallopian tube.  Left ovary was adherent to the left pelvic side wall.   Endometriosis implants - hemosiderin black, small, <10m diffusely in posterior culdesac.  Hemosiderin blue implants on anterior culdesac peritoneum.   SPECIMEN:  1. Left tube and ovary 2. Anterior culdesac peritoneum  COMPLICATIONS: none apparent  DISPOSITION: vital signs stable to PACU   Indication for Surgery: 37y.o. G1P0 with known left ovarian cyst, with recent left sided pain and found to have doubled in size and have internal debris, with a solid appearing cyst on ultrasound.  Has history of biopsy proven endometriosis and painful periods.  Risks of surgery were discussed with the patient including but not limited to: bleeding which may require transfusion or reoperation; infection which may require antibiotics; injury to bowel, bladder, ureters or other surrounding organs; need for additional procedures including laparotomy, blood clot, incisional problems and other postoperative/anesthesia complications. Written informed consent was obtained.     PROCEDURE IN DETAIL:  The patient had sequential compression devices applied to her lower extremities while in the preoperative area.  She was then taken to  the operating room where general anesthesia was administered and was found to be adequate.  She was placed in the dorsal lithotomy position, and was prepped and draped in a sterile manner.  A Foley catheter was inserted into her bladder and attached to constant drainage and a uterine manipulator was then advanced into the uterus .  After a surgical timeout was performed, attention was turned to the abdomen where an umbilical incision was made with the scalpel. A 523mtrochar was inserted in the umbilical incision using a visiport method. Opening pressure was 66m104m, and the abdomen was insufflated to 44m1mcarbon dioxide gas and adequate pneumoperitoneum was obtained.  A survey of the patient's pelvis and abdomen revealed the findings as mentioned above. Two 5mm 18mts were inserted in the lower left and right quadrants under visualization.    After evaluation of the distribution of the cysts and involvement of the tube, the decision was made to proceed with a left salpingo-oophorectomy.  The ureter was visualized and away from the IP.  The ovary was drawn medially and the IP was thrice cauterized and divided with the Ligasure.  The ovary was adherent to the pelvic side wall just beyond the IP and was unable to be manipulated out of the pelvis.  The utero-ovarian ligament and proximal fallopian tube were sealed and divided at the cornua. The underlying tissues were divided further, and from this aspect, the adhesions in the pelvis were able to be divided without difficulty.  Chromopertubation was performed by injected methylene blue dyed saline through the uterine manipulator.  Spill from the right tube was visualized immediately.    The bovie hook was then used to enter the anterior peritoneum, and the peritoneum was stripped from the  underlying adipose tissue.  The area spanned the round ligaments, anterior broad ligament, vesicouterine peritoneum, and over the bladder.  There were no apparent injuries to  any surround structures.  Once the peritoneum was free it was removed and handed off to nursing.  The umbilical port was changed to an 75m trochar, and the endocatch bag was inserted and the tube and ovary placed within.  The bag was drawn through the port site, which was expanded to accommodate the solid cystic structure.  The ovary was deflated, and drawn out of the abdomen, and handed to nursing.  The trochar was reinserted.  The operative site was surveyed, and it was found to be hemostatic.  No intraoperative injury to surrounding organs was noted.  The umbilical port site was closed with 3 interrupted stitches of 2-0 vicryl. The abdomen was desufflated and all instruments were then removed from the patient's abdomen. The uterine manipulator was removed without complications.  All skin incisions were closed with 4-0 monocryl and covered with surgical glue. The patient tolerated the procedures well.  All instruments, needles, and sponge counts were correct x 2. The patient was taken to the recovery room in stable condition.   ---- CLarey Days MD Attending Obst[facility]ian and [facility]

## 2017-10-17 NOTE — Progress Notes (Signed)
notified Estill Bamberg RN that patient has positive antibodies regarding her type and screen.

## 2017-10-17 NOTE — Anesthesia Post-op Follow-up Note (Signed)
Anesthesia QCDR form completed.        

## 2017-10-17 NOTE — Transfer of Care (Signed)
Immediate Anesthesia Transfer of Care Note  Patient: Pamela Mills  Procedure(s) Performed: LAPAROSCOPY OPERATIVE, PERITOEAL STRIPPING (N/A ) LAPAROSCOPIC UNILATERAL SALPINGO OOPHORECTOMY (Left ) CHROMOPERTUBATION (Right )  Patient Location: PACU  Anesthesia Type:General  Level of Consciousness: drowsy and patient cooperative  Airway & Oxygen Therapy: Patient Spontanous Breathing and Patient connected to face mask oxygen  Post-op Assessment: Report given to RN, Post -op Vital signs reviewed and stable and Patient moving all extremities  Post vital signs: Reviewed and stable  Last Vitals:  Vitals Value Taken Time  BP    Temp    Pulse    Resp    SpO2      Last Pain:  Vitals:   10/17/17 0634  TempSrc: Oral  PainSc: 7       Patients Stated Pain Goal: 3 (88/33/74 4514)  Complications: No apparent anesthesia complications

## 2017-10-17 NOTE — H&P (Signed)
Preoperative History and Physical  Pamela Mills is a 37 y.o. with enlarging left ovarian cyst, pelvic pain, and history of endometriosis. She has been off of her eliquis since Wednesday.  No significant preoperative concerns.  Proposed surgery: diagnostic laparoscopy, ovarian cystectomy or oophorectomy, and ablation or endometriosis.  Past Medical History:  Diagnosis Date  . Anxiety   . Arthritis   . Cancer ([place])    skin  . Collagen vascular disease ([place])   . Depression   . Diverticulitis   . Migraines   . Pulmonary embolism [facility])    Past Surgical History:  Procedure Laterality Date  . CHOLECYSTECTOMY N/A 06/29/2015   Procedure: LAPAROSCOPIC CHOLECYSTECTOMY;  Surgeon: Stark Klein, MD;  Location: [facility];  Service: General;  Laterality: N/A;  . DILATION AND EVACUATION N/A 11/10/2014   Procedure: DILATATION AND EVACUATION;  Surgeon: Honor Loh Ward, MD;  Location: [facility] ORS;  Service: Gynecology;  Laterality: N/A;  . FOOT SURGERY Left 01/2012  . LAPAROSCOPIC ENDOMETRIOSIS FULGURATION  2006  . UPPER GI ENDOSCOPY     OB History  No data available  Patient denies any other pertinent gynecologic issues.   No current facility-administered medications on file prior to encounter.    Current Outpatient Medications on File Prior to Encounter  Medication Sig Dispense Refill  . cetirizine (ZYRTEC) 10 MG tablet Take 10 mg by mouth daily.    . Cholecalciferol (VITAMIN D3) 1000 units CAPS Take 1,000 Units by mouth daily.    Marland Kitchen escitalopram (LEXAPRO) 5 MG tablet Take 15 mg by mouth every evening.     . folic acid (FOLVITE) 1 MG tablet Take 1 mg by mouth daily.    Marland Kitchen LORazepam (ATIVAN) 0.5 MG tablet Take 0.5 mg by mouth 2 (two) times daily as needed for anxiety.     Marland Kitchen omeprazole (PRILOSEC) 20 MG capsule Take 20 mg by mouth daily.     Marland Kitchen sulfaSALAzine (AZULFIDINE) 500 MG EC tablet Take 500 mg by mouth 2 (two) times daily.  0  . SUMAtriptan (IMITREX) 50 MG tablet Take 50 mg by  mouth as needed.    . topiramate (TOPAMAX) 100 MG tablet Take 100 mg by mouth at bedtime.     . triamcinolone (NASACORT ALLERGY 24HR) 55 MCG/ACT AERO nasal inhaler Place 2 sprays into the nose daily.    . vitamin B-12 (CYANOCOBALAMIN) 1000 MCG tablet Take 1,000 mcg by mouth daily.    Marland Kitchen apixaban (ELIQUIS) 5 MG TABS tablet Take 5 mg by mouth 2 (two) times daily.     Allergies  Allergen Reactions  . Other Other (See Comments)    Gluten causes inflammation in GI system.  . Gluten Meal Diarrhea and Nausea And Vomiting    Social History:   reports that she has never smoked. She has never used smokeless tobacco. She reports that she does not drink alcohol or use drugs.  Family History  Problem Relation Age of Onset  . Ovarian cancer Mother 24  . Rheumatologic disease Maternal Aunt   . Pancreatic cancer Maternal Grandfather   . Colon cancer Paternal Grandmother   . Breast cancer Neg Hx     Review of Systems: Noncontributory  PHYSICAL EXAM: Blood pressure 132/87, pulse 71, temperature 98.3 F (36.8 C), temperature source Oral, resp. rate 16, height 5' 6"  (1.676 m), weight 109.1 kg (240 lb 8 oz), SpO2 99 %. General appearance - alert, well appearing, and in no distress Chest - clear to auscultation, no wheezes, rales or  rhonchi, symmetric air entry Heart - normal rate and regular rhythm Abdomen - soft, nontender, nondistended, no masses or organomegaly Pelvic - examination not indicated Extremities - peripheral pulses normal, no pedal edema, no clubbing or cyanosis  Labs: Results for orders placed or performed during the hospital encounter of 10/17/17 (from the past 336 hour(s))  Pregnancy, urine POC   Collection Time: 10/17/17  6:24 AM  Result Value Ref Range   Preg Test, Ur NEGATIVE NEGATIVE  Type and screen [place]   Collection Time: 10/17/17  6:48 AM  Result Value Ref Range   ABO/RH(D) PENDING    Antibody Screen PENDING    Sample Expiration       10/20/2017 Performed at [facility], [address] [place], [place] [address]   Results for orders placed or performed in visit on 10/13/17 (from the past 336 hour(s))  CBC with Differential/Platelet   Collection Time: 10/13/17  9:53 AM  Result Value Ref Range   WBC 4.2 3.6 - 11.0 K/uL   RBC 4.43 3.80 - 5.20 MIL/uL   Hemoglobin 14.4 12.0 - 16.0 g/dL   HCT 41.4 35.0 - 47.0 %   MCV 93.5 80.0 - 100.0 fL   MCH 32.4 26.0 - 34.0 pg   MCHC 34.7 32.0 - 36.0 g/dL   RDW 14.2 11.5 - 14.5 %   Platelets 216 150 - 440 K/uL   Neutrophils Relative % 64 %   Neutro Abs 2.7 1.4 - 6.5 K/uL   Lymphocytes Relative 20 %   Lymphs Abs 0.8 (L) 1.0 - 3.6 K/uL   Monocytes Relative 11 %   Monocytes Absolute 0.5 0.2 - 0.9 K/uL   Eosinophils Relative 3 %   Eosinophils Absolute 0.1 0 - 0.7 K/uL   Basophils Relative 2 %   Basophils Absolute 0.1 0 - 0.1 K/uL  Comprehensive metabolic panel   Collection Time: 10/13/17  9:53 AM  Result Value Ref Range   Sodium 138 135 - 145 mmol/L   Potassium 3.8 3.5 - 5.1 mmol/L   Chloride 113 (H) 98 - 111 mmol/L   CO2 17 (L) 22 - 32 mmol/L   Glucose, Bld 98 70 - 99 mg/dL   BUN 10 6 - 20 mg/dL   Creatinine, Ser 1.05 (H) 0.44 - 1.00 mg/dL   Calcium 8.8 (L) 8.9 - 10.3 mg/dL   Total Protein 6.3 (L) 6.5 - 8.1 g/dL   Albumin 3.8 3.5 - 5.0 g/dL   AST 25 15 - 41 U/L   ALT 17 0 - 44 U/L   Alkaline Phosphatase 47 38 - 126 U/L   Total Bilirubin 1.0 0.3 - 1.2 mg/dL   GFR calc non Af Amer >60 >60 mL/min   GFR calc Af Amer >60 >60 mL/min   Anion gap 8 5 - 15    Imaging Studies: No results found.  Assessment: Patient Active Problem List   Diagnosis Date Noted  . Pulmonary embolus ([place]) 12/12/2016  . Heterozygous factor V Leiden mutation ([place]) 12/12/2016  . Rheumatoid arthritis involving multiple sites with positive rheumatoid factor ([place]) 12/12/2016  . Chronic anticoagulation 12/12/2016    Plan: Patient will undergo surgical management with diagnostic  laparoscopy, removal of ovarian cysts or ovary, and ablation of endometriosis.   The risks of surgery were discussed in detail with the patient including but not limited to: bleeding which may require transfusion or reoperation; infection which may require antibiotics; injury to surrounding organs which may involve bowel, bladder, ureters ;  need for additional procedures including laparoscopy or laparotomy; thromboembolic phenomenon, surgical site problems and other postoperative/anesthesia complications. Likelihood of success in alleviating the patient's condition was discussed. Routine postoperative instructions will be reviewed with the patient and her family in detail after surgery.  The patient concurred with the proposed plan, giving informed written consent for the surgery.  Patient has been NPO since last night she will remain NPO for procedure.  Anesthesia and OR aware.  Preoperative prophylactic antibiotics and SCDs ordered on call to the OR.  To OR when ready.  ----- Larey Days, MD Attending Obstetrician and Gynecologist [facility] Department of [facility]

## 2017-10-17 NOTE — Discharge Instructions (Signed)
Discharge instructions:  Call office if you have any of the following: fever >101 F, chills, excessive vaginal bleeding, incision drainage or problems, leg pain or redness, or any other concerns.   Activity: Do not lift > 15 lbs for 6 weeks.  No driving until you are certain you can slam on the brakes.  You may feel some pain in your upper right abdomen/rib and right shoulder.  This is from the gas in the abdomen for surgery. This will subside over time, please be patient!  Take 617m Ibuprofen and 10059mTylenol around the clock, every 6 hours for at least the first 3-5 days.  After this you can take as needed.  This will help decrease inflammation and promote healing.  The narcotics you'll take just as needed, as they just trick your brain into thinking its not in pain.    Please don't limit yourself in terms of routine activity.  You will be able to do most things, although they may take longer to do or be a little painful.  You can do it!  Don't be a hero, but don't be a wimp either!   Restart your Eliquis on Monday.    AMBULATORY SURGERY  DISCHARGE INSTRUCTIONS   1) The drugs that you were given will stay in your system until tomorrow so for the next 24 hours you should not:  A) Drive an automobile B) Make any legal decisions C) Drink any alcoholic beverage   2) You may resume regular meals tomorrow.  Today it is better to start with liquids and gradually work up to solid foods.  You may eat anything you prefer, but it is better to start with liquids, then soup and crackers, and gradually work up to solid foods.   3) Please notify your doctor immediately if you have any unusual bleeding, trouble breathing, redness and pain at the surgery site, drainage, fever, or pain not relieved by medication.    4) Additional Instructions:        Please contact your physician with any problems or Same Day Surgery at [phone] through Friday 6 am to 4 pm, or [place]  [facility] at [phone] SURGERY  DISCHARGE INSTRUCTIONS   5) The drugs that you were given will stay in your system until tomorrow so for the next 24 hours you should not:  D) Drive an automobile E) Make any legal decisions F) Drink any alcoholic beverage   6) You may resume regular meals tomorrow.  Today it is better to start with liquids and gradually work up to solid foods.  You may eat anything you prefer, but it is better to start with liquids, then soup and crackers, and gradually work up to solid foods.   7) Please notify your doctor immediately if you have any unusual bleeding, trouble breathing, redness and pain at the surgery site, drainage, fever, or pain not relieved by medication.    8) Additional Instructions:        Please contact your physician with any problems or Same Day Surgery at [phone] through Friday 6 am to 4 pm, or [place] [facility] at [phone]

## 2017-10-17 NOTE — OR Nursing (Signed)
Discussed discharge instructions with pt and husband. Both voice understanding. 

## 2017-10-17 NOTE — Anesthesia Procedure Notes (Signed)
Procedure Name: Intubation Date/Time: 10/17/2017 7:36 AM Performed by: Nile Riggs, CRNA Pre-anesthesia Checklist: Patient identified, Emergency Drugs available, Suction available, Patient being monitored and Timeout performed Patient Re-evaluated:Patient Re-evaluated prior to induction Oxygen Delivery Method: Circle system utilized Preoxygenation: Pre-oxygenation with 100% oxygen Induction Type: IV induction Ventilation: Mask ventilation without difficulty Laryngoscope Size: Miller and 2 Grade View: Grade I Tube type: Oral Tube size: 7.5 mm Number of attempts: 1 Airway Equipment and Method: Stylet Placement Confirmation: ETT inserted through vocal cords under direct vision,  positive ETCO2,  CO2 detector and breath sounds checked- equal and bilateral Secured at: 21 (@teeth ) cm Tube secured with: Tape Dental Injury: Teeth and Oropharynx as per pre-operative assessment

## 2017-10-21 NOTE — Anesthesia Postprocedure Evaluation (Signed)
Anesthesia Post Note  Patient: Pamela Mills  Procedure(s) Performed: LAPAROSCOPY OPERATIVE, PERITOEAL STRIPPING (N/A ) LAPAROSCOPIC UNILATERAL SALPINGO OOPHORECTOMY (Left ) CHROMOPERTUBATION (Right )  Patient location during evaluation: PACU Anesthesia Type: General Level of consciousness: awake and alert and oriented Pain management: pain level controlled Vital Signs Assessment: post-procedure vital signs reviewed and stable Respiratory status: spontaneous breathing Cardiovascular status: blood pressure returned to baseline Anesthetic complications: no     Last Vitals:  Vitals:   10/17/17 1130 10/17/17 1215  BP: 133/80 112/67  Pulse:  (!) 102  Resp:  14  Temp:    SpO2: 96% 97%    Last Pain:  Vitals:   10/17/17 1215  TempSrc:   PainSc: 4                  Rashena Dowling

## 2017-10-25 LAB — SURGICAL PATHOLOGY

## 2017-11-20 ENCOUNTER — Ambulatory Visit: Payer: BC Managed Care – PPO

## 2017-12-22 ENCOUNTER — Ambulatory Visit
Admission: RE | Admit: 2017-12-22 | Discharge: 2017-12-22 | Disposition: A | Discharge status: Home / Self Care | Payer: BC Managed Care – PPO | Source: Ambulatory Visit | Attending: Maternal & Fetal Medicine | Admitting: Maternal & Fetal Medicine

## 2017-12-22 VITALS — BP 120/93 | HR 79 | Temp 98.2°F | Resp 18 | Ht 67.0 in | Wt 235.8 lb

## 2017-12-22 DIAGNOSIS — I2699 Other pulmonary embolism without acute cor pulmonale: Secondary | ICD-10-CM

## 2017-12-22 DIAGNOSIS — Z7901 Long term (current) use of anticoagulants: Secondary | ICD-10-CM

## 2017-12-22 DIAGNOSIS — D6851 Activated protein C resistance: Secondary | ICD-10-CM

## 2017-12-22 DIAGNOSIS — M0579 Rheumatoid arthritis with rheumatoid factor of multiple sites without organ or systems involvement: Secondary | ICD-10-CM

## 2017-12-22 DIAGNOSIS — Z3169 Encounter for other general counseling and advice on procreation: Secondary | ICD-10-CM

## 2017-12-22 HISTORY — DX: Unspecified abnormal cytological findings in specimens from vagina: R87.629

## 2017-12-22 HISTORY — DX: Activated protein C resistance: D68.51

## 2017-12-22 HISTORY — DX: Gastro-esophageal reflux disease without esophagitis: K21.9

## 2017-12-22 NOTE — Progress Notes (Signed)
Pamela Mills Maternal-Fetal Medicine Consultation   Chief Complaint: Preconception consultation  HPI: Pamela Mills is a 37 y.o. gravida 3 para 0030 LMP 12/11/2017 who is referred by Larey Days MD for preconception consultation regarding her history of rheumatoid arthritis and her history of FVL.  In the spring of 2018, the patient, who had had a long history of chronic abdominal pain and headaches (now being treated for complicated migraine), biopsy proven celiac disease and vitamin B12 deficiency established care with Dr. Ramonita Lab at [facility].    Shortly after starting care with Dr. Eustace Moore, she developed dyspnea and a cough.  He referred her to Dr. Wallene Huh, pulmonologist.  A CT of the chest on 07/30/2016 revealed bilateral ground-glass opacities suggestive of interstitial lung disease and revealed mild bilateral adenopathy. 11/26/2016 she had another chest CT for shortness of breath and chest pain which revealed new consolidation in the LLL.  Three days later on 11/29/2017, despite antibiotics, the pain had intensified.  A chest CT revealed non-occlusive LLL pulmonary emboli and a questionable RML PE.  The patient was started on apixaban 5 mg bid and was referred to Dr. Tasia Catchings, hematologist at [facility].  Dr. Tasia Catchings discovered that the patient is a heterozygote for FVL, recommended she discontinue OCPs, and recommended that she continue anticoagulation for 6 months.  A chest CT on 01/20/2017 showed resolution of the PEs. Because of her other risk factors, she is being continued on anticoagulation indefinitely.  Two weeks ago, she had recurrence of the shortness of breath.  Dr. Raul Del ordered a CXR which revealed a RLL pneumonia. She has completed a course of antibiotics.   Dr. Eustace Moore also referred the patient to Dr. Annalee Genta, rheumatologist at [facility] for multiple issues including polyarthralgias and an elevated rheumatoid factor (27.8 on 07/31/17).  ACA IgM was mildly elevated at 15. Other labs that  were remarkable were gegative ANA by IFA , low serum C4 at  10, mild elevation ALT 41.  Decreased absolute lymphocyte count is 0.64, increase in eosinophilic percentage at 5.2%. ON 08/15/2017, she was started on prednisone 40 mg per day. 09/16/2016 she was started on methotrexate. She was tapered off of the methotrexate.  She stopped the methotrexate in June of 2019 in anticipation of a possible pregnancy.  She had also tried Plaquenil, but could not tolerate it due to pain.   Obstetric History:  OB History  Gravida Para Term Preterm AB Living  3 0 0 0 3 0  SAB TAB Ectopic Multiple Live Births  3 0 0 0 0    # Outcome Date GA Lbr Len/2nd Weight Sex Delivery Anes PTL Lv  3 SAB 09/2014 [redacted]w[redacted]d    SAB     2 SAB 07/2014 [redacted]w[redacted]d    SAB     1 SAB 03/2014 [redacted]w[redacted]d    SAB       Obstetric Comments  2 documented miscarriages-1 was 9 weeks and 11 weeks.  D & C after the second.    1 chemical pregnancy    Gynecologic History:  12/11/2017  Last pap smear was normal 05/28/2016 Nexplanon removed 08/2017 because she didn't like the way it made her feel and because she thought she might want to become pregnant.  Now abstaining out of fear of pregnancy while she is not feeling healthy. History of endometriosis. History of ovarian cysts and RSO.   Past Medical History: Patient  has a past medical history of Anxiety, Arthritis, skin cancer, Collagen vascular disease (seropositive for  RA), Depression, Diverticulitis, complicated migraines, and pulmonary embolism, heterozygote for FVL.   Past Surgical History: She  has a past surgical history that includes Foot surgery (Left, 01/2012); Upper gi endoscopy; Dilation and evacuation (N/A, 11/10/2014); Laparoscopic endometriosis fulguration (2006); Cholecystectomy (N/A, 06/29/2015); laparoscopy (N/A, 10/17/2017); Laparoscopic unilateral salpingo oophorectomy (Left, 10/17/2017); and Chromopertubation (Right, 10/17/2017).   Medications:  Current Outpatient Medications on File Prior to  Encounter  Medication Sig Dispense Refill  . apixaban (ELIQUIS) 5 MG TABS tablet Take 5 mg by mouth 2 (two) times daily.    . cetirizine (ZYRTEC) 10 MG tablet Take 10 mg by mouth daily.    . Cholecalciferol (VITAMIN D3) 1000 units CAPS Take 1,000 Units by mouth daily.    Marland Kitchen escitalopram (LEXAPRO) 5 MG tablet Take 15 mg by mouth every evening.     Marland Kitchen LORazepam (ATIVAN) 0.5 MG tablet Take 0.5 mg by mouth 2 (two) times daily as needed for anxiety.     Marland Kitchen omeprazole (PRILOSEC) 20 MG capsule Take 20 mg by mouth daily.     Marland Kitchen sulfaSALAzine (AZULFIDINE) 500 MG EC tablet Take 500 mg by mouth 2 (two) times daily.  0  . SUMAtriptan (IMITREX) 50 MG tablet Take 50 mg by mouth as needed.    . topiramate (TOPAMAX) 100 MG tablet Take 100 mg by mouth at bedtime.     . triamcinolone (NASACORT ALLERGY 24HR) 55 MCG/ACT AERO nasal inhaler Place 2 sprays into the nose daily.    . vitamin B-12 (CYANOCOBALAMIN) 1000 MCG tablet Take 1,000 mcg by mouth daily.     No current facility-administered medications on file prior to encounter.      Allergies: Patient is allergic to other and gluten meal.   Social History: Patient  reports that she has never smoked. She has never used smokeless tobacco. She reports that she does not drink alcohol or use drugs.   Family History: family history includes Colon cancer in her paternal grandmother; Ovarian cancer (age of onset: 48) in her mother; Pancreatic cancer in her maternal grandfather; Rheumatologic disease in her maternal aunt.   Review of Systems Chronic cough.  Joint pain.  A recent episode of hematuria has resolved.  Otherwise, a 12-system ROS is negative.    Physical Exam: BP (!) 120/93   Pulse 79   Temp 98.2 F (36.8 C)   Resp 18   Ht 5\' 7"  (1.702 m)   Wt 107 kg   SpO2 96%   BMI 36.93 kg/m   Exam deferred.  Here for advice.  07/29/2017 - creat 1.1; 10/27/2017 creat 1.0 12/08/2017 - creat 1.2 hepatic profile normal 12/08/2017 P/C ratio normal 10/27/2017 TSH 2.068  07/29/2017 Normal CBC 12/08/2017  PFTs 09/10/2017: Spirometry is in normal range Lung volumes in normal range DLCO IS moderately decreased (57%)  Pelvic US 10/14/2017 - Fibroids seen: 1)Rt lateral=1.4cm     2)Rt posterior=1.1cm  Asessement: 37 yo gravida 3 para 0030 here for preconception consultation with: 1. History of recurrent loss 2. AMA in a future pregnancy 3. Obesity 4. History of pulmonary emboli and FVL - on long-term anticoagulation 5. Autoimmune disease (celiac disease, B12 deficiency, RA, ?interstitial lung disease, mild lymphopenia, slightly elevated creatinine)  Recommendations: 1. History of recurrent loss  The patient was counseled about the known contributors to recurrent loss including antiphospholipid antibodies (the patient has been tested and only has a low-positive ACA IgM), parental translocations and abnormalities of the uterine cavity  When she is close to trying to conceive, she could  have her uterine cavity evaluated, unless this has been previously  I recommended a meeting with one of our genetic counselors, especially since the patient will be AMA at the time of a future pregnancy  If the patient has any difficulty becoming pregnant, she should be referred to Pamela Mills. 2. AMA in a future pregnancy  Consultation with a genetic counselor in anticipation of pregnancy or during pregnancy 3. Obesity  Obesity is a risk factor for both maternal and fetal adverse outocme  The patient was encouraged to achieve her ideal body weight (BMI 20-25)  4. History of pulmonary emboli and FVL - on long-term anticoagulation  The patient was counseled that apixaban crosses the placenta and with conception she would need to be converted to enoxaparin 1 mg/kg q 12 hrs. 5. Autoimmune disease (celiac disease, B12 deficiency, RA, ?interstitial lung disease, mild lymphopenia, slightly elevated creatinine)  Prior to trying to conceive, I would recommend that the patient meet with our pregnancy  rheumatologist at [facility].   The patient will need testing for anti-Ro and anti-La antibodies.    6. Recent pneumonia  I recommended the patient postpone pregnancy until her pulmonary status has improved/stabilized and she has conferred with Pamela Mills.  Overall, I believe that she could safely carry a pregnancy with high risk OB care and careful medical supervision.  Nonetheless, I think she requires some further evaluation first.    Total time spent with the patient was 60 minutes with greater than 50% spent in counseling and coordination of care.  We appreciate this consult and will be happy to be involved in the ongoing care of Pamela Mills in anyway her obstetricians desire.  Erasmo Score, MD [facility]

## 2017-12-29 ENCOUNTER — Encounter: Payer: Self-pay | Admitting: *Deleted

## 2017-12-29 ENCOUNTER — Emergency Department: Payer: BC Managed Care – PPO

## 2017-12-29 ENCOUNTER — Emergency Department
Admission: EM | Admit: 2017-12-29 | Discharge: 2017-12-29 | Disposition: A | Discharge status: Home / Self Care | Payer: BC Managed Care – PPO | Attending: Emergency Medicine | Admitting: Emergency Medicine

## 2017-12-29 ENCOUNTER — Other Ambulatory Visit: Payer: Self-pay

## 2017-12-29 DIAGNOSIS — Z85828 Personal history of other malignant neoplasm of skin: Secondary | ICD-10-CM | POA: Diagnosis not present

## 2017-12-29 DIAGNOSIS — Z79899 Other long term (current) drug therapy: Secondary | ICD-10-CM | POA: Insufficient documentation

## 2017-12-29 DIAGNOSIS — R0602 Shortness of breath: Secondary | ICD-10-CM | POA: Insufficient documentation

## 2017-12-29 DIAGNOSIS — Z7901 Long term (current) use of anticoagulants: Secondary | ICD-10-CM | POA: Insufficient documentation

## 2017-12-29 DIAGNOSIS — R079 Chest pain, unspecified: Secondary | ICD-10-CM | POA: Diagnosis present

## 2017-12-29 DIAGNOSIS — R05 Cough: Secondary | ICD-10-CM | POA: Diagnosis not present

## 2017-12-29 DIAGNOSIS — J189 Pneumonia, unspecified organism: Secondary | ICD-10-CM

## 2017-12-29 LAB — CBC
HEMATOCRIT: 41.5 % (ref 35.0–47.0)
HEMOGLOBIN: 14 g/dL (ref 12.0–16.0)
MCH: 31.3 pg (ref 26.0–34.0)
MCHC: 33.6 g/dL (ref 32.0–36.0)
MCV: 93.1 fL (ref 80.0–100.0)
Platelets: 209 10*3/uL (ref 150–440)
RBC: 4.46 MIL/uL (ref 3.80–5.20)
RDW: 13.9 % (ref 11.5–14.5)
WBC: 4.7 10*3/uL (ref 3.6–11.0)

## 2017-12-29 LAB — FIBRIN DERIVATIVES D-DIMER (ARMC ONLY): FIBRIN DERIVATIVES D-DIMER (ARMC): 589.47 ng{FEU}/mL — AB (ref 0.00–499.00)

## 2017-12-29 LAB — BASIC METABOLIC PANEL
ANION GAP: 8 (ref 5–15)
BUN: 15 mg/dL (ref 6–20)
CO2: 17 mmol/L — AB (ref 22–32)
Calcium: 8.8 mg/dL — ABNORMAL LOW (ref 8.9–10.3)
Chloride: 111 mmol/L (ref 98–111)
Creatinine, Ser: 1.02 mg/dL — ABNORMAL HIGH (ref 0.44–1.00)
GFR calc Af Amer: >60 mL/min (ref >60)
GLUCOSE: 105 mg/dL — AB (ref 70–99)
POTASSIUM: 3.7 mmol/L (ref 3.5–5.1)
Sodium: 136 mmol/L (ref 135–145)

## 2017-12-29 LAB — POCT PREGNANCY, URINE: Preg Test, Ur: NEGATIVE

## 2017-12-29 LAB — TROPONIN I: Troponin I: <0.03 ng/mL (ref <0.03)

## 2017-12-29 MED ORDER — AZITHROMYCIN 250 MG PO TABS: ORAL_TABLET | ORAL | 0 refills | Status: AC

## 2017-12-29 MED ORDER — IOHEXOL 350 MG/ML SOLN
75.0000 mL | Freq: Once | INTRAVENOUS | Status: AC | PRN
  Administered 2017-12-29: 75 mL via INTRAVENOUS
  Filled 2017-12-29: qty 75

## 2017-12-29 MED ORDER — DOXYCYCLINE HYCLATE 100 MG PO CAPS: 100.0000 mg | ORAL_CAPSULE | Freq: Two times a day (BID) | ORAL | 0 refills | Status: AC

## 2017-12-29 MED ORDER — IPRATROPIUM-ALBUTEROL 0.5-2.5 (3) MG/3ML IN SOLN
3.0000 mL | Freq: Once | RESPIRATORY_TRACT | Status: AC
  Administered 2017-12-29: 3 mL via RESPIRATORY_TRACT
  Filled 2017-12-29: qty 3

## 2017-12-29 NOTE — Discharge Instructions (Addendum)
Please seek medical attention for any high fevers, chest pain, shortness of breath, change in behavior, persistent vomiting, bloody stool or any other new or concerning symptoms.  

## 2017-12-29 NOTE — ED Notes (Signed)
Pt discharged home after verbalizing understanding of discharge instructions; nad noted. 

## 2017-12-29 NOTE — ED Triage Notes (Signed)
Pt ambulatory to triage. Pt has chest pain and sob.  Hx PE's.  Sx began 2 days ago   No n/v  Pt reports right side chest pain.  Pt alert

## 2017-12-29 NOTE — ED Notes (Signed)
No ct scan at this time, only d-dimer per dr Kerman Passey.

## 2017-12-29 NOTE — ED Provider Notes (Signed)
[facility] Emergency Department Provider Note  ____________________________________________   I have reviewed the triage vital signs and the nursing notes.   HISTORY  Chief Complaint Chest Pain and Shortness of Breath   History limited by: Not Limited   HPI Pamela Mills is a 37 y.o. female who presents to the emergency department today because of concerns for chest pain and some shortness of breath.  Patient states that she has had a cough for roughly 1 month.  She has seen her physician for this and had been prescribed steroids and antibiotic.  She states this is not significantly affected her breathing.  However for the past couple days she has noticed some right sided pain.  This has been accompanied by shortness of breath.  She states her cough is not changed.  No fevers however she does states she has chills.  Patient has a history of PEs in the past and is on Eliquis.  She states she did miss 2 days of her Eliquis last week.  Per medical record review patient has a history of pulmonary embolism, factor v Leiden, on eliquis.   Past Medical History:  Diagnosis Date  . Anxiety   . Arthritis   . Cancer ([place])    skin  . Collagen vascular disease ([place])   . Depression   . Diverticulitis   . Factor 5 Leiden mutation, heterozygous ([place]) 2018  . GERD (gastroesophageal reflux disease)   . Migraines   . Preterm labor   . Pulmonary embolism ([place])   . Vaginal Pap smear, abnormal     Patient Active Problem List   Diagnosis Date Noted  . Pulmonary embolus ([place]) 12/12/2016  . Heterozygous factor V Leiden mutation ([place]) 12/12/2016  . Rheumatoid arthritis involving multiple sites with positive rheumatoid factor ([place]) 12/12/2016  . Chronic anticoagulation 12/12/2016    Past Surgical History:  Procedure Laterality Date  . CHOLECYSTECTOMY N/A 06/29/2015   Procedure: LAPAROSCOPIC CHOLECYSTECTOMY;  Surgeon: Stark Klein, MD;  Location: [facility];   Service: General;  Laterality: N/A;  . CHROMOPERTUBATION Right 10/17/2017   Procedure: CHROMOPERTUBATION;  Surgeon: Ward, Honor Loh, MD;  Location: [facility];  Service: Gynecology;  Laterality: Right;  . [place] OF UTERUS  2016  . DILATION AND EVACUATION N/A 11/10/2014   Procedure: DILATATION AND EVACUATION;  Surgeon: Honor Loh Ward, MD;  Location: [facility];  Service: Gynecology;  Laterality: N/A;  . FOOT SURGERY Left 01/2012  . FOOT SURGERY Left 2013   Plantar Fasiciatis  . LAPAROSCOPIC ENDOMETRIOSIS FULGURATION  2006  . LAPAROSCOPIC UNILATERAL SALPINGO OOPHERECTOMY Left 10/17/2017   Procedure: LAPAROSCOPIC UNILATERAL SALPINGO OOPHORECTOMY;  Surgeon: Ward, Honor Loh, MD;  Location: [facility];  Service: Gynecology;  Laterality: Left;  . LAPAROSCOPY N/A 10/17/2017   Procedure: LAPAROSCOPY OPERATIVE, Raeanne Gathers;  Surgeon: Ward, Honor Loh, MD;  Location: [facility];  Service: Gynecology;  Laterality: N/A;  . oophotectomy Left 09/2017   2 cysts on the same side also removed  . UPPER GI ENDOSCOPY      Prior to Admission medications   Medication Sig Start Date End Date Taking? Authorizing Provider  apixaban (ELIQUIS) 5 MG TABS tablet Take 5 mg by mouth 2 (two) times daily. 11/29/16   [provider]  cetirizine (ZYRTEC) 10 MG tablet Take 10 mg by mouth daily.    [provider]  Cholecalciferol (VITAMIN D3) 1000 units CAPS Take 1,000 Units by mouth daily.    [provider]  escitalopram (  LEXAPRO) 5 MG tablet Take 15 mg by mouth every evening.     [provider]  LORazepam (ATIVAN) 0.5 MG tablet Take 0.5 mg by mouth 2 (two) times daily as needed for anxiety.     [provider]  omeprazole (PRILOSEC) 20 MG capsule Take 20 mg by mouth daily.     [provider]  sulfaSALAzine (AZULFIDINE) 500 MG EC tablet Take 500 mg by mouth 2 (two) times daily. 09/30/17   [provider]  SUMAtriptan (IMITREX) 50 MG tablet Take 50 mg  by mouth as needed. 06/13/16   [provider]  topiramate (TOPAMAX) 100 MG tablet Take 100 mg by mouth at bedtime.  08/26/16   [provider]  triamcinolone (NASACORT ALLERGY 24HR) 55 MCG/ACT AERO nasal inhaler Place 2 sprays into the nose daily.    [provider]  vitamin B-12 (CYANOCOBALAMIN) 1000 MCG tablet Take 1,000 mcg by mouth daily.    [provider]    Allergies Other and Gluten meal  Family History  Problem Relation Age of Onset  . Ovarian cancer Mother 28  . Rheumatologic disease Maternal Aunt   . Pancreatic cancer Maternal Grandfather   . Colon cancer Paternal Grandmother   . Breast cancer Neg Hx     Social History Social History   Tobacco Use  . Smoking status: Never Smoker  . Smokeless tobacco: Never Used  Substance Use Topics  . Alcohol use: No  . Drug use: No    Review of Systems Constitutional: No fever/chills Eyes: No visual changes. ENT: No sore throat. Cardiovascular: Positive for chest pain Respiratory: Positive for shortness of breath and cough Gastrointestinal: No abdominal pain.  No nausea, no vomiting.  No diarrhea.   Genitourinary: Negative for dysuria. Musculoskeletal: Negative for back pain. Skin: Negative for rash. Neurological: Negative for headaches, focal weakness or numbness.  ____________________________________________   PHYSICAL EXAM:  VITAL SIGNS: ED Triage Vitals  Enc Vitals Group     BP 12/29/17 1611 129/79     Pulse Rate 12/29/17 1609 70     Resp 12/29/17 1609 20     Temp 12/29/17 1609 98.5 F (36.9 C)     Temp Source 12/29/17 1609 Oral     SpO2 12/29/17 1609 97 %     Weight 12/29/17 1610 230 lb (104.3 kg)     Height 12/29/17 1610 5\' 7"  (1.702 m)     Head Circumference --      Peak Flow --      Pain Score 12/29/17 1610 5    Constitutional: Alert and oriented.  Eyes: Conjunctivae are normal.  ENT      Head: Normocephalic and atraumatic.      Nose: No congestion/rhinnorhea.       Mouth/Throat: Mucous membranes are moist.      Neck: No stridor. Hematological/Lymphatic/Immunilogical: No cervical lymphadenopathy. Cardiovascular: Normal rate, regular rhythm.  No murmurs, rubs, or gallops.  Respiratory: Normal respiratory effort without tachypnea nor retractions. Breath sounds are clear and equal bilaterally. No wheezes/rales/rhonchi. Gastrointestinal: Soft and non tender. No rebound. No guarding.  Genitourinary: Deferred Musculoskeletal: Normal range of motion in all extremities. No lower extremity edema. Neurologic:  Normal speech and language. No gross focal neurologic deficits are appreciated.  Skin:  Skin is warm, dry and intact. No rash noted. Psychiatric: Mood and affect are normal. Speech and behavior are normal. Patient exhibits appropriate insight and judgment.  ____________________________________________    LABS (pertinent positives/negatives)  Upreg negative  ____________________________________________  EKG  I, Nance Pear, attending physician, personally viewed and interpreted this EKG  EKG Time: 1616 Rate: 69 Rhythm: normal sinus rhythm Axis: normal Intervals: qtc 443 QRS: narrow, low voltage ST changes: no st elevation Impression: abnormal ekg  ____________________________________________    RADIOLOGY  CXR Atelectasis vs pneumonia at the bases  CT angio No PE, bilateral pneumonia ____________________________________________   PROCEDURES  Procedures  ____________________________________________   INITIAL IMPRESSION / ASSESSMENT AND PLAN / ED COURSE  Pertinent labs & imaging results that were available during my care of the patient were reviewed by me and considered in my medical decision making (see chart for details).   Patient presented to the emergency department today because of concerns for chest pain.  Patient is also been having shortness of breath and cough.  Patient has a history of PEs so d-dimer was  sent.  This was elevated.  Given this finding CT Angie was performed.  Did not show PE but did show concern for possible pneumonia.  Discussed this with the patient.  Will plan on discharging with antibiotics.  Discussed plan with patient.   ____________________________________________   FINAL CLINICAL IMPRESSION(S) / ED DIAGNOSES  Final diagnoses:  Pneumonia due to infectious organism, unspecified laterality, unspecified part of lung     Note: This dictation was prepared with Dragon dictation. Any transcriptional errors that result from this process are unintentional     Nance Pear, MD 12/29/17 2106

## 2017-12-29 NOTE — ED Notes (Signed)
poct pregnancy Negative 

## 2017-12-29 NOTE — ED Notes (Signed)
Recollect of blue and lavender tubes sent to lab

## 2018-01-13 ENCOUNTER — Inpatient Hospital Stay: Payer: BC Managed Care – PPO

## 2018-01-13 ENCOUNTER — Inpatient Hospital Stay: Payer: BC Managed Care – PPO | Admitting: Oncology

## 2018-02-02 ENCOUNTER — Encounter: Payer: Self-pay | Admitting: Oncology

## 2018-02-02 ENCOUNTER — Other Ambulatory Visit: Payer: Self-pay

## 2018-02-02 ENCOUNTER — Inpatient Hospital Stay: Payer: BC Managed Care – PPO | Attending: Oncology

## 2018-02-02 ENCOUNTER — Inpatient Hospital Stay (HOSPITAL_BASED_OUTPATIENT_CLINIC_OR_DEPARTMENT_OTHER): Payer: BC Managed Care – PPO | Admitting: Oncology

## 2018-02-02 VITALS — BP 127/88 | HR 66 | Temp 97.5°F | Resp 18 | Wt 235.2 lb

## 2018-02-02 DIAGNOSIS — Z800 Family history of malignant neoplasm of digestive organs: Secondary | ICD-10-CM

## 2018-02-02 DIAGNOSIS — Z79899 Other long term (current) drug therapy: Secondary | ICD-10-CM | POA: Diagnosis not present

## 2018-02-02 DIAGNOSIS — Z86711 Personal history of pulmonary embolism: Secondary | ICD-10-CM | POA: Insufficient documentation

## 2018-02-02 DIAGNOSIS — J189 Pneumonia, unspecified organism: Secondary | ICD-10-CM

## 2018-02-02 DIAGNOSIS — D6851 Activated protein C resistance: Secondary | ICD-10-CM

## 2018-02-02 DIAGNOSIS — Z7901 Long term (current) use of anticoagulants: Secondary | ICD-10-CM

## 2018-02-02 DIAGNOSIS — M0579 Rheumatoid arthritis with rheumatoid factor of multiple sites without organ or systems involvement: Secondary | ICD-10-CM

## 2018-02-02 DIAGNOSIS — Z803 Family history of malignant neoplasm of breast: Secondary | ICD-10-CM

## 2018-02-02 DIAGNOSIS — I2699 Other pulmonary embolism without acute cor pulmonale: Secondary | ICD-10-CM

## 2018-02-02 DIAGNOSIS — Z8041 Family history of malignant neoplasm of ovary: Secondary | ICD-10-CM

## 2018-02-02 LAB — COMPREHENSIVE METABOLIC PANEL
ALBUMIN: 4 g/dL (ref 3.5–5.0)
ALT: 12 U/L (ref 0–44)
AST: 22 U/L (ref 15–41)
Alkaline Phosphatase: 43 U/L (ref 38–126)
Anion gap: 7 (ref 5–15)
BILIRUBIN TOTAL: 0.5 mg/dL (ref 0.3–1.2)
BUN: 9 mg/dL (ref 6–20)
CO2: 21 mmol/L — ABNORMAL LOW (ref 22–32)
Calcium: 8.9 mg/dL (ref 8.9–10.3)
Chloride: 109 mmol/L (ref 98–111)
Creatinine, Ser: 1.07 mg/dL — ABNORMAL HIGH (ref 0.44–1.00)
GFR calc Af Amer: >60 mL/min (ref >60)
Glucose, Bld: 86 mg/dL (ref 70–99)
POTASSIUM: 3.7 mmol/L (ref 3.5–5.1)
Sodium: 137 mmol/L (ref 135–145)
TOTAL PROTEIN: 6.7 g/dL (ref 6.5–8.1)

## 2018-02-02 LAB — CBC WITH DIFFERENTIAL/PLATELET
ABS IMMATURE GRANULOCYTES: 0 10*3/uL (ref 0.00–0.07)
BASOS ABS: 0 10*3/uL (ref 0.0–0.1)
BASOS PCT: 1 %
Eosinophils Absolute: 0.2 10*3/uL (ref 0.0–0.5)
Eosinophils Relative: 5 %
HEMATOCRIT: 38.9 % (ref 36.0–46.0)
HEMOGLOBIN: 13.1 g/dL (ref 12.0–15.0)
IMMATURE GRANULOCYTES: 0 %
Lymphocytes Relative: 20 %
Lymphs Abs: 0.7 10*3/uL (ref 0.7–4.0)
MCH: 30.7 pg (ref 26.0–34.0)
MCHC: 33.7 g/dL (ref 30.0–36.0)
MCV: 91.1 fL (ref 80.0–100.0)
Monocytes Absolute: 0.6 10*3/uL (ref 0.1–1.0)
Monocytes Relative: 17 %
NEUTROS ABS: 1.9 10*3/uL (ref 1.7–7.7)
NEUTROS PCT: 57 %
Platelets: 204 10*3/uL (ref 150–400)
RBC: 4.27 MIL/uL (ref 3.87–5.11)
RDW: 13.1 % (ref 11.5–15.5)
WBC: 3.3 10*3/uL — AB (ref 4.0–10.5)
nRBC: 0 % (ref 0.0–0.2)

## 2018-02-02 LAB — FIBRIN DERIVATIVES D-DIMER (ARMC ONLY): Fibrin derivatives D-dimer (ARMC): 681.94 ng/mL (FEU) — ABNORMAL HIGH (ref 0.00–499.00)

## 2018-02-02 NOTE — Progress Notes (Signed)
Patient here for follow up

## 2018-02-02 NOTE — Progress Notes (Signed)
Hematology/Oncology follow-up [name]  Patient Care Team: Adin Hector, MD as PCP - General (Internal Medicine)  Reason for visit Follow-up for PE and low protein S level  Referring Physician: Dr.Fleming Herbon.  HISTORY OF PRESENTING ILLNESS:  Pamela Mills 37 y.o.  female with past medical history listed as below who  follow-up for management of anticoagulation due to recent PE. Since  November 2017, patient started to experience persistent dry cough, exertional shortness of breath, progressive swelling in hands and feet, she was referred to see rheumatology. Workup showed seropositive rheumatoid arthritis, Negative anti-CCP but elevated RF Patient was  Prednisone and later started on methotrexate weekly (started on 08/15/2016) for treatment.. Patient's respiratory symptoms improved and then worsen again. At that point she was referred by her primary care to see Dr. Raul Del pulmonary for evaluation. CT without contrast on 07/30/2016 showed basilar and apparent from predominant ground glass opacities in the architectural distortion, with possible minimum lower lobe bronchiectasis suggesting interstitial lung disease. There is also mild the bilateral axillary, subpectoral, supraclavicular, mediastinal adenopathy.lymph nodes are increased in number, mildly enlarged. Largest left axillary measuring 11 mm short axis. Findings favor reactive/systemic process with autoimmune disorders.  Patient's rheumatology symptoms improved with treatment. Patient had wax and wane of her respiratory symptoms. In the summer, she has had 2 round trips of 8 hour car ride back and forth to [place] where her father lives. Most recent one was on August 5. She has felt lower extremity swelling for which she has to put on stocking to reduce swelling. Also she felt a little stretchy feeling on her cough. She mentioned to Dr. Raul Del who ordered a follow-up CT scan. CT without contrast chest on  11/26/2016 showed patchy groundglass in the septal thickening in both lower lobes, less severe than 07/27/2016. There is new rounded consolidation in the left lower lobe, possible infectious etiology. No definitive evidence of interstitial lung disease. Meanwhile her respiratory symptoms has become worse. She has experienced coughing, exertional shortness of breath, and mild hemoptysis. This triggered a d-dimer testing which came back high. Patient had CT anginal chest PE protocol on 11/29/2016 which showed acute appearing nonconclusive pulmonary emboli within several segmental pulmonary artery branches to the left lower lobe. Small pleural-based consolidation with the posterior aspect of the left lower lobe presumably associated focal pulmonary infarction. Associated small left pleural effusion. No evidence of associated right heart straining. Questionable focal low-density pulmonary embolus within the segmental pulmonary artery branch to the right middle lobe.   lower extremity ultrasound revealed no DVT within the right lower extremity. Patient was started on Eliquis 5 mg twice a day.  Patient has had some rheumatology/hypercoagulable workup done with [facility]. Reviewing her labs showed on 08/08/2016, she is tested negative for anti-Cardiolite pain antibody IgG < 9, IgM slightly elevated at 15. Beta-2 glycoprotein IgG and IgM normal, normal axial coronal phase phospholipid, DVvT normal, negative lupus anticoagulant.. Serum protein electrophoresis negative for M spike.  11/29/2016, homocystine 8, lupus anticoagulant was retested was negative, mildly low protein S activity at 48, normal protein S antigen. Slightly low protein S antigen. Normal anti-thrombin function. She is tested positive for single R506Q mutation heterozygous factor V Leiden mutation.  Patient has a family history of ovarian cancer in her mom. She reports that she was tested negative for genetic test.  She takes OCP since she was  teenage.  This is her fist episode of blood clot. She was taking of OCP and  referred to see gynecology.  # 06/13/2017 Antiphospholipid panel was checked at last visit again.  Which showed negative panel except slightly elevated anti cardiolipin IgM at 15.  Repeat protein S activity was 75%, protein S antigen at 52, mildly decreased.  slight positive anti cardiolipin IgM does not meet criteria for Antiphospholipid syndrome  #  was also seen by GYN Dr.Ward and she has had pelvic ultrasound which showed ovarian cysts, which may be contributing to her left lower quadrant pain.. nextplanon has been taken out.  INTERVAL HISTORY Pamela Mills is a 37 y.o. female who has above history reviewed by me today presents for follow-up management of pulmonary embolism.   # She continues to be on Eliquis 5mg  BID.  # Recently she has cough, productive with yellowish sputum. Cough started in August this year,  She has another CT chest angio protocol on 12/29/2017 which showed no PE, positive for extensive lower lobe pneumonia bilaterally involving multiple segments on each side. No thoracic adenopathy.  Per patient, she has tried multiple rounds of antibiotics and symptom has not improved.  She is supposed to have repeat CT done in 02/10/2018.   #Joint pain, stable.  She is continued on Suldasalazine for rheumatoid arthritis. Fatigue: chronic stable.   ROS:  Review of Systems  Constitutional: Positive for fatigue. Negative for diaphoresis and fever.  HENT:   Negative for nosebleeds, sore throat and tinnitus.   Eyes: Negative for icterus.  Respiratory: Positive for cough. Negative for chest tightness.   Cardiovascular: Negative for chest pain, leg swelling and palpitations.  Gastrointestinal: Negative for abdominal distention, abdominal pain, blood in stool, constipation and vomiting.  Endocrine: Negative for hot flashes.  Genitourinary: Negative for difficulty urinating, frequency, hematuria and nocturia.     Musculoskeletal: Positive for back pain. Negative for arthralgias, myalgias and neck pain.  Skin: Negative for itching and rash.  Neurological: Negative for dizziness and light-headedness.  Hematological: Negative for adenopathy.  Psychiatric/Behavioral: Negative for confusion and decreased concentration. The patient is not nervous/anxious.     MEDICAL HISTORY:  Past Medical History:  Diagnosis Date  . Anxiety   . Arthritis   . Cancer ([place])    skin  . Collagen vascular disease ([place])   . Depression   . Diverticulitis   . Factor 5 Leiden mutation, heterozygous ([place]) 2018  . GERD (gastroesophageal reflux disease)   . Migraines   . Preterm labor   . Pulmonary embolism ([place])   . Vaginal Pap smear, abnormal     SURGICAL HISTORY: Past Surgical History:  Procedure Laterality Date  . CHOLECYSTECTOMY N/A 06/29/2015   Procedure: LAPAROSCOPIC CHOLECYSTECTOMY;  Surgeon: Stark Klein, MD;  Location: [facility];  Service: General;  Laterality: N/A;  . CHROMOPERTUBATION Right 10/17/2017   Procedure: CHROMOPERTUBATION;  Surgeon: Ward, Honor Loh, MD;  Location: [facility];  Service: Gynecology;  Laterality: Right;  . [place] OF UTERUS  2016  . DILATION AND EVACUATION N/A 11/10/2014   Procedure: DILATATION AND EVACUATION;  Surgeon: Honor Loh Ward, MD;  Location: [facility];  Service: Gynecology;  Laterality: N/A;  . FOOT SURGERY Left 01/2012  . FOOT SURGERY Left 2013   Plantar Fasiciatis  . LAPAROSCOPIC ENDOMETRIOSIS FULGURATION  2006  . LAPAROSCOPIC UNILATERAL SALPINGO OOPHERECTOMY Left 10/17/2017   Procedure: LAPAROSCOPIC UNILATERAL SALPINGO OOPHORECTOMY;  Surgeon: Ward, Honor Loh, MD;  Location: [facility];  Service: Gynecology;  Laterality: Left;  . LAPAROSCOPY N/A 10/17/2017   Procedure: LAPAROSCOPY OPERATIVE, PERITOEAL STRIPPING;  Surgeon: Ward,  Honor Loh, MD;  Location: [facility];  Service: Gynecology;  Laterality: N/A;  . oophotectomy Left 09/2017   2 cysts on the  same side also removed  . UPPER GI ENDOSCOPY      SOCIAL HISTORY: Social History   Socioeconomic History  . Marital status: Married    Spouse name: [name]  . Number of children: Not on file  . Years of education: Not on file  . Highest education level: Not on file  Occupational History  . Not on file  Social Needs  . Financial resource strain: Not on file  . Food insecurity:    Worry: Not on file    Inability: Not on file  . Transportation needs:    Medical: Not on file    Non-medical: Not on file  Tobacco Use  . Smoking status: Never Smoker  . Smokeless tobacco: Never Used  Substance and Sexual Activity  . Alcohol use: No  . Drug use: No  . Sexual activity: Not Currently  Lifestyle  . Physical activity:    Days per week: Not on file    Minutes per session: Not on file  . Stress: Not on file  Relationships  . Social connections:    Talks on phone: Not on file    Gets together: Not on file    Attends religious service: Not on file    Active member of club or organization: Not on file    Attends meetings of clubs or organizations: Not on file    Relationship status: Not on file  . Intimate partner violence:    Fear of current or ex partner: Not on file    Emotionally abused: Not on file    Physically abused: Not on file    Forced sexual activity: Not on file  Other Topics Concern  . Not on file  Social History Narrative  . Not on file    FAMILY HISTORY: Family History  Problem Relation Age of Onset  . Ovarian cancer Mother 61  . Rheumatologic disease Maternal Aunt   . Pancreatic cancer Maternal Grandfather   . Colon cancer Paternal Grandmother   . Breast cancer Neg Hx     ALLERGIES:  is allergic to other and gluten meal.  MEDICATIONS:  Current Outpatient Medications  Medication Sig Dispense Refill  . apixaban (ELIQUIS) 5 MG TABS tablet Take 5 mg by mouth 2 (two) times daily.    . cetirizine (ZYRTEC) 10 MG tablet Take 10 mg by mouth daily.    .  Cholecalciferol (VITAMIN D3) 1000 units CAPS Take 1,000 Units by mouth daily.    Marland Kitchen escitalopram (LEXAPRO) 5 MG tablet Take 15 mg by mouth every evening.     . folic acid (FOLVITE) 1 MG tablet Take 1 mg by mouth daily.  11  . LORazepam (ATIVAN) 0.5 MG tablet Take 0.5 mg by mouth 2 (two) times daily as needed for anxiety.     Marland Kitchen omeprazole (PRILOSEC) 20 MG capsule Take 20 mg by mouth daily.     Marland Kitchen sulfaSALAzine (AZULFIDINE) 500 MG EC tablet Take 500 mg by mouth 2 (two) times daily.  0  . SUMAtriptan (IMITREX) 50 MG tablet Take 50 mg by mouth as needed.    . topiramate (TOPAMAX) 100 MG tablet Take 100 mg by mouth at bedtime.     . triamcinolone (NASACORT ALLERGY 24HR) 55 MCG/ACT AERO nasal inhaler Place 2 sprays into the nose daily.    . vitamin B-12 (CYANOCOBALAMIN) 1000 MCG tablet  Take 1,000 mcg by mouth daily.     No current facility-administered medications for this visit.       Marland Kitchen  PHYSICAL EXAMINATION: ECOG PERFORMANCE STATUS: 0 - Asymptomatic Vitals:   02/02/18 1352  BP: 127/88  Pulse: 66  Resp: 18  Temp: (!) 97.5 F (36.4 C)   Filed Weights   02/02/18 1352  Weight: 235 lb 3.2 oz (106.7 kg)   Physical Exam  Constitutional: She is oriented to person, place, and time. No distress.  HENT:  Head: Normocephalic and atraumatic.  Nose: Nose normal.  Mouth/Throat: Oropharynx is clear and moist. No oropharyngeal exudate.  Eyes: Pupils are equal, round, and reactive to light. Conjunctivae and EOM are normal. Left eye exhibits no discharge. No scleral icterus.  Neck: Normal range of motion. Neck supple.  Cardiovascular: Normal rate, regular rhythm and normal heart sounds.  No murmur heard. Pulmonary/Chest: Effort normal. No respiratory distress. She has no wheezes. She has no rales. She exhibits no tenderness.  Decreased breath laterally at the base.  Abdominal: Soft. Bowel sounds are normal. She exhibits no distension and no mass. There is no tenderness. There is no rebound.    Musculoskeletal: Normal range of motion. She exhibits no edema.  Lymphadenopathy:    She has no cervical adenopathy.  Neurological: She is alert and oriented to person, place, and time. No cranial nerve deficit. She exhibits normal muscle tone. Coordination normal.  Skin: Skin is warm and dry. She is not diaphoretic. No erythema.  Psychiatric: Affect normal.      LABORATORY DATA:  I have reviewed the data as listed Patient has had some rheumatology/hypercoagulable workup done with [facility]. Reviewing her labs showed on 08/08/2016, she is tested negative for anti-Cardiolite pain antibody IgG < 9, IgM slightly elevated at 15. Beta-2 glycoprotein IgG and IgM normal, normal axial coronal phase phospholipid, DVvT normal, negative lupus anticoagulant.. Serum protein electrophoresis negative for M spike. Onto ferritin negative. 11/29/2016, homocystine 8, lupus anticoagulant was retested was negative, mildly low protein S activity at 48, normal protein S antigen. Slightly low protein S antigen. Normal anti-thrombin function. She is tested positive for single R506Q mutation heterozygous factor V Leiden mutation.  RADIOGRAPHIC STUDIES: I have personally reviewed the radiological images as listed and agreed with the findings in the report.CT without contrast on 07/30/2016 showed basilar and apparent from predominant ground glass opacities in the architectural distortion, with possible minimum lower lobe bronchiectasis suggesting interstitial lung disease. There is also mild the bilateral axillary, subpectoral, supraclavicular, mediastinal adenopathy.lymph nodes are increased in number, mildly enlarged. Largest left axillary measuring 11 mm short axis. Findings favor reactive/systemic process with autoimmune disorders.   Patient had CT anginal chest PE protocol on 11/29/2016 which showed acute appearing nonconclusive pulmonary emboli within several segmental pulmonary artery branches to the left  lower lobe. Small pleural-based consolidation with the posterior aspect of the left lower lobe presumably associated focal pulmonary infarction. Associated small left pleural effusion. No evidence of associated right heart straining. Questionable focal low-density pulmonary embolus within the segmental pulmonary artery branch to the right middle lobe.   She has had CT abdomen pelvis with contrast done on Aug 19, 2017 image was independently reviewed by me.  Showed no acute intra-abdominal/pelvic process.  Mild amount of retained large bowel stool with mild colonic diverticulosis.  Bilateral lower lobe atelectasis and groundglass opacities which may be infectious or inflammatory.  Grade 1 L5-S1 anterolisthesis, bilateral L5 chronic pars interarticularis defects.  Severe left L5-S1 neuroforaminal  narrowing  12/29/2017 CT angio chest PE 1. No demonstrable pulmonary embolus. No thoracic aortic aneurysm or dissection. 2. Extensive lower lobe pneumonia bilaterally involving multiple segments on each side. There is also infiltrate in the inferior Lingula. 3.  No demonstrable thoracic adenopathy.  4.  Gallbladder absent. ASSESSMENT & PLAN:  1. History of pulmonary embolism   2. Chronic anticoagulation   3. Rheumatoid arthritis involving multiple sites with positive rheumatoid factor ([place])   4. Heterozygous factor V Leiden mutation ([place])     #Previous PE is most likely provoked by her autoimmune/rheumatology problem. She has completed one year of anticoagulation.  CT chest angio was independently reviewed by me and discussed with patient.  I have discussed with her about switching to 2.5mg  BID after she completes 1 year anticoagulation.Marland Kitchen  However, given that recently she has had pneumonia, not improved with antibiotics. Will continue same anticoagulation plan for now until her pneumonia status resolved.   # Persistent cough, pneumonia, not improved with outpatient antibiotics.  Most likely need  bronchoscopy for further evaluation. Ok to hold Eliquis 2-3 days prior to procedure, resume afterwards.  She has a repeat CT scan soon and follows up with Dr.Fleming.   # #Heterozygous factor V Leiden mutation,  #Expresses her desire of getting pregnant in the future once her rheumatoid arthritis being controlled. Discussed with patient that if she plans conceiving, will need to switch her to heparin.   Follow up in 3 months Orders Placed This Encounter  Procedures  . CBC with Differential/Platelet    Standing Status:   Future    Standing Expiration Date:   02/02/2019  . Comprehensive metabolic panel    Standing Status:   Future    Standing Expiration Date:   02/02/2019    Earlie Server, MD, PhD Hematology Oncology [place] [facility] Pager-, 02/02/2018

## 2018-02-04 LAB — PROTEIN S, TOTAL AND FREE
PROTEIN S AG TOTAL: 63 % (ref 60–150)
Protein S Ag, Free: 85 % (ref 57–157)

## 2018-02-18 ENCOUNTER — Other Ambulatory Visit: Payer: Self-pay

## 2018-02-18 ENCOUNTER — Encounter
Admission: RE | Disposition: A | Discharge status: Home / Self Care | Payer: Self-pay | Source: Ambulatory Visit | Attending: Pulmonary Disease

## 2018-02-18 ENCOUNTER — Ambulatory Visit
Admission: RE | Admit: 2018-02-18 | Discharge: 2018-02-18 | Disposition: A | Discharge status: Home / Self Care | Payer: BC Managed Care – PPO | Source: Ambulatory Visit | Attending: Pulmonary Disease | Admitting: Pulmonary Disease

## 2018-02-18 DIAGNOSIS — R0602 Shortness of breath: Secondary | ICD-10-CM | POA: Diagnosis present

## 2018-02-18 DIAGNOSIS — M069 Rheumatoid arthritis, unspecified: Secondary | ICD-10-CM | POA: Insufficient documentation

## 2018-02-18 DIAGNOSIS — J189 Pneumonia, unspecified organism: Secondary | ICD-10-CM | POA: Diagnosis not present

## 2018-02-18 HISTORY — PX: FLEXIBLE BRONCHOSCOPY: SHX5094

## 2018-02-18 SURGERY — BRONCHOSCOPY, FLEXIBLE
Anesthesia: Moderate Sedation

## 2018-02-18 MED ORDER — MIDAZOLAM HCL 2 MG/2ML IJ SOLN
INTRAMUSCULAR | Status: DC | PRN
  Administered 2018-02-18: 1 mg via INTRAVENOUS
  Administered 2018-02-18 (×5): 2 mg via INTRAVENOUS
  Administered 2018-02-18: 1 mg via INTRAVENOUS

## 2018-02-18 MED ORDER — MIDAZOLAM HCL 2 MG/2ML IJ SOLN
INTRAMUSCULAR | Status: AC
  Filled 2018-02-18: qty 4

## 2018-02-18 MED ORDER — MIDAZOLAM HCL 2 MG/2ML IJ SOLN
INTRAMUSCULAR | Status: AC
  Filled 2018-02-18: qty 8

## 2018-02-18 MED ORDER — FENTANYL CITRATE (PF) 100 MCG/2ML IJ SOLN
INTRAMUSCULAR | Status: DC | PRN
  Administered 2018-02-18 (×6): 25 ug via INTRAVENOUS

## 2018-02-18 MED ORDER — FENTANYL CITRATE (PF) 100 MCG/2ML IJ SOLN
INTRAMUSCULAR | Status: AC
  Filled 2018-02-18: qty 4

## 2018-02-18 NOTE — Procedures (Signed)
FLEXIBLE BRONCHOSCOPY PROCEDURE NOTE    Flexible bronchoscopy was performed on 02/18/18 by : Lanney Gins MD  assistance by : 1) Pam and 2) Lissa   Indication for the procedure was :  Pre-procedural H&P. The following assessment was performed on the day of the procedure prior to initiating sedation History:  Chest pain none Dyspnea none Hemoptysis none Cough mild Fever none Other pertinent items : anxiety +  Examination Vital signs -reviewed as per nursing documentation today Cardiac    Murmurs: none  Rubs : none  Gallop: none Lungs Wheezing: none Rales : none Rhonchi {:none  Other pertinent findings: none   Pre-procedural assessment for Procedural Sedation included: Depth of sedation: As per anesthesia team  ASA Classification:  2 Mallampati airway assessment: 2    Medication list reviewed: yes  The patient's interval history was taken and revealed: no new complaints The pre- procedure physical examination revealed: No new findings Refer to prior clinic note for details.  Informed Consent: Informed consent was obtained from:  patient after explanation of procedure and risks, benefits, as well as alternative procedures available.  Explanation of level of sedation and possible transfusion was also provided.    Procedural Preparation: Time out was performed and patient was identified by name and birthdate and procedure to be performed and side for sampling, if any, was specified. Pt was intubated by anesthesia.  The patient was appropriately draped.  Procedure Findings: Bronchoscope was inserted via ETT  without difficulty.  Posterior oropharynx, epiglottis, arytenoids, false cords and vocal cords were not visualized as these were bypassed by endotracheal tube.   The distal trachea was normal in circumference and appearance with mucosal edema, but no cartilaginous or branching abnormalities.  The main carina was mildy splayed .   All right and left lobar airways  were visualized to the Subsegmental level.  Sub- sub segmental carinae were identified in all the distal airways.   Secretions were visible in the following airways and appeared to be normal clear.  The mucosa was : Friable, mild bleeding with gentle scope manipulation noted  Airways were notable for:        exophytic lesions :none       extrinsic compression in the following distributions: none.       Friable mucosa: +       Anthrocotic material /pigmentation: none   Pictorial documentation attached: yes       Broncho-alveolar lavage site:RLL and LLL  sent for [facility] and Cytology                             210 ml volume infused 130 ml volume returned with cloudy with mucus appearance   Total procedure time: 58 min Total drug used : Fentanyl 18mg, Versed 12 mg   The bronchoscopy was terminated due to completion of the planned procedure and the bronchoscope was removed.   Total dosage of Lidocaine was 679mTotal fluoroscopy time was 0 minutes  Supplemental oxygen was provided at 2 lpm by nasal canula post operatively  Estimated Blood loss: 0cc.  Complications included:  none  Preliminary CXR findings :  n/a  Disposition: home with husband  Follow up with Dr. AlLanney Ginsn 7 days for result discussion.   FrClaudette StaplerD  [facility]

## 2018-02-18 NOTE — Discharge Instructions (Signed)
AMBULATORY SURGERY  DISCHARGE INSTRUCTIONS   1) The drugs that you were given will stay in your system until tomorrow so for the next 24 hours you should not:  A) Drive an automobile B) Make any legal decisions C) Drink any alcoholic beverage   2) You may resume regular meals tomorrow.  Today it is better to start with liquids and gradually work up to solid foods.  You may eat anything you prefer, but it is better to start with liquids, then soup and crackers, and gradually work up to solid foods.   3) Please notify your doctor immediately if you have any unusual bleeding, trouble breathing, redness and pain at the surgery site, drainage, fever, or pain not relieved by medication.    4) Additional Instructions:        Please contact your physician with any problems or Same Day Surgery at [phone], Monday through Friday 6 am to 4 pm, or [place] at [facility] number at [phone].Flexible Bronchoscopy, Care After These instructions give you information on caring for yourself after your procedure. Your doctor may also give you more specific instructions. Call your doctor if you have any problems or questions after your procedure. Follow these instructions at home:  Do not eat or drink anything for 2 hours after your procedure. If you try to eat or drink before the medicine wears off, food or drink could go into your lungs. You could also burn yourself.  After 2 hours have passed and when you can cough and gag normally, you may eat soft food and drink liquids slowly.  The day after the test, you may eat your normal diet.  You may do your normal activities.  Keep all doctor visits. Get help right away if:  You get more and more short of breath.  You get light-headed.  You feel like you are going to pass out (faint).  You have chest pain.  You have new problems that worry you.  You cough up more than a little blood.  You cough up more blood than  before. This information is not intended to replace advice given to you by your health care provider. Make sure you discuss any questions you have with your health care provider. Document Released: 01/06/2009 Document Revised: 08/17/2015 Document Reviewed: 11/13/2012 Elsevier Interactive Patient Education  2017 [employer].

## 2018-02-18 NOTE — H&P (Signed)
Pulmonary Medicine         Date: 02/18/2018,   MRN# [id number] Pamela Mills 1980/09/11      Admission     Bronchoscopy with BAL             Current  Pamela Mills is a 37 y.o. old female seen in consultation for bronchoscopy at the request of Dr Caryl Comes.     CHIEF COMPLAINT:   SOB at rest and DOE   HISTORY OF PRESENT ILLNESS   This is a pleasant 37 yo female, she is accompanied by her husband Pamela Mills today.  She has a history notable for RA currently on methotrexate and sulfasalazine and previously on prednisone, Celiac disease, PE with underlying coagulopathy currently on BID Eliquis.  She has been dyspneic with cough for >2 months now.  She had been evaluated and treated for possible CAP and was prescribed appropriate regimen but did not respond well with recurrent symptoms.  Currently she has dyspnea and cough productive of "mustard" colored phlegm and sometimes grey colored mucus.  She denies fevers, still able to go to work, denies constitutional symptoms, denies syncope.    PAST MEDICAL HISTORY   Past Medical History:  Diagnosis Date  . Anxiety   . Arthritis   . Cancer ([place])    skin  . Collagen vascular disease ([place])   . Depression   . Diverticulitis   . Factor 5 Leiden mutation, heterozygous ([place]) 2018  . GERD (gastroesophageal reflux disease)   . Migraines   . Preterm labor   . Pulmonary embolism ([place])   . Vaginal Pap smear, abnormal      SURGICAL HISTORY   Past Surgical History:  Procedure Laterality Date  . CHOLECYSTECTOMY N/A 06/29/2015   Procedure: LAPAROSCOPIC CHOLECYSTECTOMY;  Surgeon: Stark Klein, MD;  Location: [facility];  Service: General;  Laterality: N/A;  . CHROMOPERTUBATION Right 10/17/2017   Procedure: CHROMOPERTUBATION;  Surgeon: Ward, Honor Loh, MD;  Location: [facility];  Service: Gynecology;  Laterality: Right;  . [place] OF UTERUS  2016  . DILATION AND EVACUATION N/A 11/10/2014   Procedure:  DILATATION AND EVACUATION;  Surgeon: Honor Loh Ward, MD;  Location: [facility];  Service: Gynecology;  Laterality: N/A;  . FOOT SURGERY Left 01/2012  . FOOT SURGERY Left 2013   Plantar Fasiciatis  . LAPAROSCOPIC ENDOMETRIOSIS FULGURATION  2006  . LAPAROSCOPIC UNILATERAL SALPINGO OOPHERECTOMY Left 10/17/2017   Procedure: LAPAROSCOPIC UNILATERAL SALPINGO OOPHORECTOMY;  Surgeon: Ward, Honor Loh, MD;  Location: [facility];  Service: Gynecology;  Laterality: Left;  . LAPAROSCOPY N/A 10/17/2017   Procedure: LAPAROSCOPY OPERATIVE, Raeanne Gathers;  Surgeon: Ward, Honor Loh, MD;  Location: [facility];  Service: Gynecology;  Laterality: N/A;  . oophotectomy Left 09/2017   2 cysts on the same side also removed  . UPPER GI ENDOSCOPY       FAMILY HISTORY   Family History  Problem Relation Age of Onset  . Ovarian cancer Mother 74  . Rheumatologic disease Maternal Aunt   . Pancreatic cancer Maternal Grandfather   . Colon cancer Paternal Grandmother   . Breast cancer Neg Hx      SOCIAL HISTORY   Social History   Tobacco Use  . Smoking status: Never Smoker  . Smokeless tobacco: Never Used  Substance Use Topics  . Alcohol use: No  . Drug use: No     MEDICATIONS    Home Medication: as per [facility]   Current Medication:  Current Facility-Administered Medications:  .  fentaNYL (SUBLIMAZE) 100 MCG/2ML injection, , , ,  .  midazolam (VERSED) 2 MG/2ML injection, , , ,     ALLERGIES   Other and Gluten meal     REVIEW OF SYSTEMS    Review of Systems:  Gen:  Denies  fever, sweats, chills weigh loss  HEENT: Denies blurred vision, double vision, ear pain, eye pain, hearing loss, nose bleeds, sore throat Cardiac:  No dizziness, chest pain or heaviness, chest tightness,edema Resp:   Denies cough or sputum porduction, shortness of breath,wheezing, hemoptysis,  Gi: Denies swallowing difficulty, stomach pain, nausea or vomiting, diarrhea, constipation, bowel incontinence Gu:  Denies  bladder incontinence, burning urine Ext:   Denies Joint pain, stiffness or swelling Skin: Denies  skin rash, easy bruising or bleeding or hives Endoc:  Denies polyuria, polydipsia , polyphagia or weight change Psych:   Denies depression, insomnia or hallucinations   Other:  All other systems negative   VS: BP 121/81   Pulse 78   Temp (!) 97.5 F (36.4 C) (Tympanic)   Resp 14   LMP 02/03/2018   SpO2 96%      PHYSICAL EXAM  Physical Examination:   GENERAL:NAD, no fevers, chills, no weakness no fatigue HEAD: Normocephalic, atraumatic.  EYES: Pupils equal, round, reactive to light. Extraocular muscles intact. No scleral icterus.  MOUTH: Moist mucosal membrane. Dentition intact. No abscess noted.  EAR, NOSE, THROAT: Clear without exudates. No external lesions.  NECK: Supple. No thyromegaly. No nodules. No JVD.  PULMONARY: Diffuse coarse rhonchi right sided +wheezes CARDIOVASCULAR: S1 and S2. Regular rate and rhythm. No murmurs, rubs, or gallops. No edema. Pedal pulses 2+ bilaterally.  GASTROINTESTINAL: Soft, nontender, nondistended. No masses. Positive bowel sounds. No hepatosplenomegaly.  MUSCULOSKELETAL: No swelling, clubbing, or edema. Range of motion full in all extremities.  NEUROLOGIC: Cranial nerves II through XII are intact. No gross focal neurological deficits. Sensation intact. Reflexes intact.  SKIN: No ulceration, lesions, rashes, or cyanosis. Skin warm and dry. Turgor intact.  PSYCHIATRIC: Mood, affect within normal limits. The patient is awake, alert and oriented x 3. Insight, judgment intact.       IMAGING    No results found.  Reviewed most recent CT today prior to procedure   ASSESSMENT/PLAN   Recurrent refactory Pneumonia   - in context of relative immunosuppresion due to steroids and immunomodulating drugs for RA as well as multiple antibiotic rounds  - plan for bronchoscopy with BAL to be sent for cytology and micro as well as special staining for  AFB, GMS, PAS, KOH as well as prussian blue for hemosiderin to evaluate for Pamela Mills variant due to underlying Celiac sprue.    Patient/Family are satisfied with Plan of action and management. All questions answered      Ottie Glazier, MD Division of Pulmonary and Critical Care Medicine 8:07 AM 02/18/2018

## 2018-02-19 ENCOUNTER — Encounter: Payer: Self-pay | Admitting: Pulmonary Disease

## 2018-02-19 LAB — KOH PREP: KOH PREP: NONE SEEN

## 2018-02-20 LAB — CULTURE, BAL-QUANTITATIVE W GRAM STAIN

## 2018-02-20 LAB — ASPERGILLUS ANTIGEN, BAL/SERUM: ASPERGILLUS AG, BAL/SERUM: 0.04 {index} (ref 0.00–0.49)

## 2018-02-20 LAB — CULTURE, BAL-QUANTITATIVE: CULTURE: NO GROWTH

## 2018-02-21 LAB — RESPIRATORY VIRUS PANEL
Adenovirus: NEGATIVE
Influenza A: NEGATIVE
Influenza B: NEGATIVE
Metapneumovirus: NEGATIVE
PARAINFLUENZA 1 A: NEGATIVE
PARAINFLUENZA 2 A: NEGATIVE
Parainfluenza 3: NEGATIVE
RESPIRATORY SYNCYTIAL VIRUS B: NEGATIVE
Respiratory Syncytial Virus A: NEGATIVE
Rhinovirus: POSITIVE — AB

## 2018-02-21 LAB — CULTURE, BAL-QUANTITATIVE W GRAM STAIN: Culture: 100 — AB

## 2018-02-21 LAB — CULTURE, BAL-QUANTITATIVE

## 2018-02-24 LAB — CYTOLOGY - NON PAP

## 2018-03-03 LAB — ACID FAST SMEAR (AFB, MYCOBACTERIA): Acid Fast Smear: NEGATIVE

## 2018-03-03 LAB — ACID FAST SMEAR (AFB)

## 2018-03-11 LAB — CULTURE, FUNGUS WITHOUT SMEAR

## 2018-04-15 LAB — ACID FAST CULTURE WITH REFLEXED SENSITIVITIES: ACID FAST CULTURE - AFSCU3: NEGATIVE

## 2018-04-15 LAB — ACID FAST CULTURE WITH REFLEXED SENSITIVITIES (MYCOBACTERIA)

## 2018-05-06 ENCOUNTER — Inpatient Hospital Stay (HOSPITAL_BASED_OUTPATIENT_CLINIC_OR_DEPARTMENT_OTHER): Payer: BC Managed Care – PPO | Admitting: Oncology

## 2018-05-06 ENCOUNTER — Other Ambulatory Visit: Payer: Self-pay

## 2018-05-06 ENCOUNTER — Encounter: Payer: Self-pay | Admitting: Oncology

## 2018-05-06 ENCOUNTER — Inpatient Hospital Stay: Payer: BC Managed Care – PPO | Attending: Oncology

## 2018-05-06 VITALS — BP 125/82 | HR 72 | Temp 96.8°F | Resp 18 | Wt 233.7 lb

## 2018-05-06 DIAGNOSIS — J8489 Other specified interstitial pulmonary diseases: Secondary | ICD-10-CM

## 2018-05-06 DIAGNOSIS — Z79899 Other long term (current) drug therapy: Secondary | ICD-10-CM | POA: Diagnosis not present

## 2018-05-06 DIAGNOSIS — I2699 Other pulmonary embolism without acute cor pulmonale: Secondary | ICD-10-CM | POA: Diagnosis not present

## 2018-05-06 DIAGNOSIS — Z800 Family history of malignant neoplasm of digestive organs: Secondary | ICD-10-CM | POA: Insufficient documentation

## 2018-05-06 DIAGNOSIS — Z86711 Personal history of pulmonary embolism: Secondary | ICD-10-CM | POA: Insufficient documentation

## 2018-05-06 DIAGNOSIS — M0579 Rheumatoid arthritis with rheumatoid factor of multiple sites without organ or systems involvement: Secondary | ICD-10-CM | POA: Insufficient documentation

## 2018-05-06 DIAGNOSIS — Z8041 Family history of malignant neoplasm of ovary: Secondary | ICD-10-CM | POA: Diagnosis not present

## 2018-05-06 DIAGNOSIS — D6851 Activated protein C resistance: Secondary | ICD-10-CM | POA: Insufficient documentation

## 2018-05-06 DIAGNOSIS — Z7901 Long term (current) use of anticoagulants: Secondary | ICD-10-CM | POA: Diagnosis not present

## 2018-05-06 LAB — RETIC PANEL
IMMATURE RETIC FRACT: 8.9 % (ref 2.3–15.9)
RBC.: 4.17 MIL/uL (ref 3.87–5.11)
RETICULOCYTE HEMOGLOBIN: 34.6 pg (ref >27.9)
Retic Count, Absolute: 78.4 10*3/uL (ref 19.0–186.0)
Retic Ct Pct: 1.9 % (ref 0.4–3.1)

## 2018-05-06 LAB — COMPREHENSIVE METABOLIC PANEL
ALT: 91 U/L — ABNORMAL HIGH (ref 0–44)
ANION GAP: 6 (ref 5–15)
AST: 52 U/L — ABNORMAL HIGH (ref 15–41)
Albumin: 4.1 g/dL (ref 3.5–5.0)
Alkaline Phosphatase: 46 U/L (ref 38–126)
BILIRUBIN TOTAL: 0.4 mg/dL (ref 0.3–1.2)
BUN: 11 mg/dL (ref 6–20)
CHLORIDE: 107 mmol/L (ref 98–111)
CO2: 23 mmol/L (ref 22–32)
Calcium: 8.9 mg/dL (ref 8.9–10.3)
Creatinine, Ser: 0.92 mg/dL (ref 0.44–1.00)
Glucose, Bld: 115 mg/dL — ABNORMAL HIGH (ref 70–99)
POTASSIUM: 4.2 mmol/L (ref 3.5–5.1)
Sodium: 136 mmol/L (ref 135–145)
TOTAL PROTEIN: 6.8 g/dL (ref 6.5–8.1)

## 2018-05-06 LAB — CBC WITH DIFFERENTIAL/PLATELET
ABS IMMATURE GRANULOCYTES: 0.02 10*3/uL (ref 0.00–0.07)
BASOS PCT: 0 %
Basophils Absolute: 0 10*3/uL (ref 0.0–0.1)
Eosinophils Absolute: 0 10*3/uL (ref 0.0–0.5)
Eosinophils Relative: 0 %
HEMATOCRIT: 37.8 % (ref 36.0–46.0)
Hemoglobin: 12.7 g/dL (ref 12.0–15.0)
Immature Granulocytes: 0 %
LYMPHS PCT: 13 %
Lymphs Abs: 0.7 10*3/uL (ref 0.7–4.0)
MCH: 30.4 pg (ref 26.0–34.0)
MCHC: 33.6 g/dL (ref 30.0–36.0)
MCV: 90.4 fL (ref 80.0–100.0)
MONO ABS: 0.3 10*3/uL (ref 0.1–1.0)
Monocytes Relative: 6 %
NEUTROS ABS: 4.5 10*3/uL (ref 1.7–7.7)
NRBC: 0 % (ref 0.0–0.2)
Neutrophils Relative %: 81 %
PLATELETS: 238 10*3/uL (ref 150–400)
RBC: 4.18 MIL/uL (ref 3.87–5.11)
RDW: 13.1 % (ref 11.5–15.5)
WBC: 5.5 10*3/uL (ref 4.0–10.5)

## 2018-05-06 NOTE — Progress Notes (Signed)
Patient here for follow up. Pt currently on antibiotic because she is on immunosuppressants.

## 2018-05-08 NOTE — Progress Notes (Signed)
Hematology/Oncology follow-up [place]  Patient Care Team: Adin Hector, MD as PCP - General (Internal Medicine)  Reason for visit Follow-up for PE and low protein S level  Referring Physician: Dr.Fleming Herbon.  HISTORY OF PRESENTING ILLNESS:  Pamela Mills 38 y.o.  female with past medical history listed as below who  follow-up for management of anticoagulation due to recent PE. Since  November 2017, patient started to experience persistent dry cough, exertional shortness of breath, progressive swelling in hands and feet, she was referred to see rheumatology. Workup showed seropositive rheumatoid arthritis, Negative anti-CCP but elevated RF Patient was  Prednisone and later started on methotrexate weekly (started on 08/15/2016) for treatment.. Patient's respiratory symptoms improved and then worsen again. At that point she was referred by her primary care to see Dr. Raul Del pulmonary for evaluation. CT without contrast on 07/30/2016 showed basilar and apparent from predominant ground glass opacities in the architectural distortion, with possible minimum lower lobe bronchiectasis suggesting interstitial lung disease. There is also mild the bilateral axillary, subpectoral, supraclavicular, mediastinal adenopathy.lymph nodes are increased in number, mildly enlarged. Largest left axillary measuring 11 mm short axis. Findings favor reactive/systemic process with autoimmune disorders.  Patient's rheumatology symptoms improved with treatment. Patient had wax and wane of her respiratory symptoms. In the summer, she has had 2 round trips of 8 hour car ride back and forth to [place] where her father lives. Most recent one was on August 5. She has felt lower extremity swelling for which she has to put on stocking to reduce swelling. Also she felt a little stretchy feeling on her cough. She mentioned to Dr. Raul Del who ordered a follow-up CT scan. CT without contrast chest on  11/26/2016 showed patchy groundglass in the septal thickening in both lower lobes, less severe than 07/27/2016. There is new rounded consolidation in the left lower lobe, possible infectious etiology. No definitive evidence of interstitial lung disease. Meanwhile her respiratory symptoms has become worse. She has experienced coughing, exertional shortness of breath, and mild hemoptysis. This triggered a d-dimer testing which came back high. Patient had CT anginal chest PE protocol on 11/29/2016 which showed acute appearing nonconclusive pulmonary emboli within several segmental pulmonary artery branches to the left lower lobe. Small pleural-based consolidation with the posterior aspect of the left lower lobe presumably associated focal pulmonary infarction. Associated small left pleural effusion. No evidence of associated right heart straining. Questionable focal low-density pulmonary embolus within the segmental pulmonary artery branch to the right middle lobe.   lower extremity ultrasound revealed no DVT within the right lower extremity. Patient was started on Eliquis 5 mg twice a day.  Patient has had some rheumatology/hypercoagulable workup done with [facility]. Reviewing her labs showed on 08/08/2016, she is tested negative for anti-Cardiolite pain antibody IgG < 9, IgM slightly elevated at 15. Beta-2 glycoprotein IgG and IgM normal, normal axial coronal phase phospholipid, DVvT normal, negative lupus anticoagulant.. Serum protein electrophoresis negative for M spike.  11/29/2016, homocystine 8, lupus anticoagulant was retested was negative, mildly low protein S activity at 48, normal protein S antigen. Slightly low protein S antigen. Normal anti-thrombin function. She is tested positive for single R506Q mutation heterozygous factor V Leiden mutation.  Patient has a family history of ovarian cancer in her mom. She reports that she was tested negative for genetic test.  She takes OCP since she was  teenage.  This is her fist episode of blood clot. She was taking of OCP and  referred to see gynecology.  # 06/13/2017 Antiphospholipid panel was checked at last visit again.  Which showed negative panel except slightly elevated anti cardiolipin IgM at 15.  Repeat protein S activity was 75%, protein S antigen at 52, mildly decreased.  slight positive anti cardiolipin IgM does not meet criteria for Antiphospholipid syndrome  #  was also seen by GYN Dr.Ward and she has had pelvic ultrasound which showed ovarian cysts, which may be contributing to her left lower quadrant pain.. nextplanon has been taken out.  INTERVAL HISTORY Pamela Mills is a 38 y.o. female who has above history reviewed by me today presents for follow-up management of pulmonary embolism.  Continue to tolerate chronic anticoagulation with Eliquis 5 mg twice daily very well.  Denies any bleeding events. During interval she has established care with [facility] pulmonologist Dr. Lavonda Jumbo.  She was diagnosed with nonspecific interstitial pneumonitis.  She was started on CellCept and prednisone and more recently switched from CellCept to azathioprine.  On Bactrim DS q. MWF.  Also on prednisone 50 mg daily currently. She reports her breathing has significantly improved since then. Still has a cough, but has improved as well.  She is also planning to conceive sometime this year after her lung condition has improved.   ROS:  Review of Systems  Constitutional: Negative for appetite change, chills, fatigue and fever.  HENT:   Negative for hearing loss and voice change.   Eyes: Negative for eye problems.  Respiratory: Positive for cough. Negative for chest tightness.   Cardiovascular: Negative for chest pain.  Gastrointestinal: Negative for abdominal distention, abdominal pain and blood in stool.  Endocrine: Negative for hot flashes.  Genitourinary: Negative for difficulty urinating and frequency.   Musculoskeletal: Negative for  arthralgias.  Skin: Negative for itching and rash.  Neurological: Negative for extremity weakness.  Hematological: Negative for adenopathy.  Psychiatric/Behavioral: Negative for confusion.    MEDICAL HISTORY:  Past Medical History:  Diagnosis Date  . Anxiety   . Arthritis   . Cancer ([place])    skin  . Collagen vascular disease ([place])   . Depression   . Diverticulitis   . Factor 5 Leiden mutation, heterozygous ([place]) 2018  . GERD (gastroesophageal reflux disease)   . Migraines   . Preterm labor   . Pulmonary embolism ([place])   . Vaginal Pap smear, abnormal     SURGICAL HISTORY: Past Surgical History:  Procedure Laterality Date  . CHOLECYSTECTOMY N/A 06/29/2015   Procedure: LAPAROSCOPIC CHOLECYSTECTOMY;  Surgeon: Stark Klein, MD;  Location: [facility];  Service: General;  Laterality: N/A;  . CHROMOPERTUBATION Right 10/17/2017   Procedure: CHROMOPERTUBATION;  Surgeon: Ward, Honor Loh, MD;  Location: [facility];  Service: Gynecology;  Laterality: Right;  . [place] OF UTERUS  2016  . DILATION AND EVACUATION N/A 11/10/2014   Procedure: DILATATION AND EVACUATION;  Surgeon: Honor Loh Ward, MD;  Location: [facility];  Service: Gynecology;  Laterality: N/A;  . FLEXIBLE BRONCHOSCOPY N/A 02/18/2018   Procedure: FLEXIBLE BRONCHOSCOPY;  Surgeon: Ottie Glazier, MD;  Location: [facility];  Service: Thoracic;  Laterality: N/A;  . FOOT SURGERY Left 01/2012  . FOOT SURGERY Left 2013   Plantar Fasiciatis  . LAPAROSCOPIC ENDOMETRIOSIS FULGURATION  2006  . LAPAROSCOPIC UNILATERAL SALPINGO OOPHERECTOMY Left 10/17/2017   Procedure: LAPAROSCOPIC UNILATERAL SALPINGO OOPHORECTOMY;  Surgeon: Ward, Honor Loh, MD;  Location: [facility];  Service: Gynecology;  Laterality: Left;  . LAPAROSCOPY N/A 10/17/2017   Procedure: LAPAROSCOPY OPERATIVE, PERITOEAL STRIPPING;  Surgeon: Ward, Honor Loh, MD;  Location: [facility];  Service: Gynecology;  Laterality: N/A;  . oophotectomy Left 09/2017   2  cysts on the same side also removed  . UPPER GI ENDOSCOPY      SOCIAL HISTORY: Social History   Socioeconomic History  . Marital status: Married    Spouse name: [name]  . Number of children: Not on file  . Years of education: Not on file  . Highest education level: Not on file  Occupational History  . Not on file  Social Needs  . Financial resource strain: Not on file  . Food insecurity:    Worry: Not on file    Inability: Not on file  . Transportation needs:    Medical: Not on file    Non-medical: Not on file  Tobacco Use  . Smoking status: Never Smoker  . Smokeless tobacco: Never Used  Substance and Sexual Activity  . Alcohol use: No  . Drug use: No  . Sexual activity: Not Currently  Lifestyle  . Physical activity:    Days per week: Not on file    Minutes per session: Not on file  . Stress: Not on file  Relationships  . Social connections:    Talks on phone: Not on file    Gets together: Not on file    Attends religious service: Not on file    Active member of club or organization: Not on file    Attends meetings of clubs or organizations: Not on file    Relationship status: Not on file  . Intimate partner violence:    Fear of current or ex partner: Not on file    Emotionally abused: Not on file    Physically abused: Not on file    Forced sexual activity: Not on file  Other Topics Concern  . Not on file  Social History Narrative  . Not on file    FAMILY HISTORY: Family History  Problem Relation Age of Onset  . Ovarian cancer Mother 33  . Rheumatologic disease Maternal Aunt   . Pancreatic cancer Maternal Grandfather   . Colon cancer Paternal Grandmother   . Breast cancer Neg Hx     ALLERGIES:  is allergic to other and gluten meal.  MEDICATIONS:  Current Outpatient Medications  Medication Sig Dispense Refill  . acetaminophen (TYLENOL) 500 MG tablet Take 1,000 mg by mouth every 8 (eight) hours as needed (pain).    Marland Kitchen apixaban (ELIQUIS) 5 MG TABS  tablet Take 5 mg by mouth 2 (two) times daily.    Marland Kitchen azaTHIOprine (IMURAN) 50 MG tablet Take 2 tablets by mouth daily.    . cetirizine (ZYRTEC) 10 MG tablet Take 10 mg by mouth daily.    . Cholecalciferol (VITAMIN D3) 1000 units CAPS Take 1,000 Units by mouth daily.    Marland Kitchen escitalopram (LEXAPRO) 5 MG tablet Take 15 mg by mouth every evening.     . folic acid (FOLVITE) 1 MG tablet Take 1 mg by mouth daily.  11  . Galcanezumab-gnlm 120 MG/ML SOAJ Inject 120 mg into the skin every 28 (twenty-eight) days.    Marland Kitchen LORazepam (ATIVAN) 0.5 MG tablet Take 0.5 mg by mouth 2 (two) times daily as needed for anxiety.     Marland Kitchen omeprazole (PRILOSEC) 20 MG capsule Take 20 mg by mouth daily.     . predniSONE (DELTASONE) 10 MG tablet Take 15 mg by mouth daily.    Marland Kitchen sulfamethoxazole-trimethoprim (BACTRIM DS,SEPTRA DS) 800-160 MG tablet  Take 1 tablet by mouth every Monday, Wednesday and Friday    . sulfaSALAzine (AZULFIDINE) 500 MG EC tablet Take 500 mg by mouth 2 (two) times daily.  0  . SUMAtriptan (IMITREX) 50 MG tablet Take 50 mg by mouth every 2 (two) hours as needed for migraine.     . topiramate (TOPAMAX) 25 MG tablet Take 25 mg by mouth daily.    Marland Kitchen triamcinolone (NASACORT ALLERGY 24HR) 55 MCG/ACT AERO nasal inhaler Place 2 sprays into the nose daily.    . vitamin B-12 (CYANOCOBALAMIN) 1000 MCG tablet Take 1,000 mcg by mouth daily.     No current facility-administered medications for this visit.       Marland Kitchen  PHYSICAL EXAMINATION: ECOG PERFORMANCE STATUS: 0 - Asymptomatic Vitals:   05/06/18 1457  BP: 125/82  Pulse: 72  Resp: 18  Temp: (!) 96.8 F (36 C)   Filed Weights   05/06/18 1457  Weight: 233 lb 11.2 oz (106 kg)   Physical Exam  Constitutional: She is oriented to person, place, and time. No distress.  HENT:  Head: Normocephalic and atraumatic.  Nose: Nose normal.  Mouth/Throat: Oropharynx is clear and moist. No oropharyngeal exudate.  Eyes: Pupils are equal, round, and reactive to light.  Conjunctivae and EOM are normal. Left eye exhibits no discharge. No scleral icterus.  Neck: Normal range of motion. Neck supple.  Cardiovascular: Normal rate, regular rhythm and normal heart sounds.  No murmur heard. Pulmonary/Chest: Effort normal and breath sounds normal. No respiratory distress. She has no wheezes. She has no rales. She exhibits no tenderness.  Abdominal: Soft. Bowel sounds are normal. She exhibits no distension and no mass. There is no abdominal tenderness. There is no rebound.  Musculoskeletal: Normal range of motion.        General: No edema.  Lymphadenopathy:    She has no cervical adenopathy.  Neurological: She is alert and oriented to person, place, and time. No cranial nerve deficit. She exhibits normal muscle tone. Coordination normal.  Skin: Skin is warm and dry. She is not diaphoretic. No erythema.  Psychiatric: Affect normal.      LABORATORY DATA:  I have reviewed the data as listed Patient has had some rheumatology/hypercoagulable workup done with [facility]. Reviewing her labs showed on 08/08/2016, she is tested negative for anti-Cardiolite pain antibody IgG < 9, IgM slightly elevated at 15. Beta-2 glycoprotein IgG and IgM normal, normal axial coronal phase phospholipid, DVvT normal, negative lupus anticoagulant.. Serum protein electrophoresis negative for M spike. Onto ferritin negative. 11/29/2016, homocystine 8, lupus anticoagulant was retested was negative, mildly low protein S activity at 48, normal protein S antigen. Slightly low protein S antigen. Normal anti-thrombin function. She is tested positive for single R506Q mutation heterozygous factor V Leiden mutation.  RADIOGRAPHIC STUDIES: I have personally reviewed the radiological images as listed and agreed with the findings in the report.CT without contrast on 07/30/2016 showed basilar and apparent from predominant ground glass opacities in the architectural distortion, with possible minimum  lower lobe bronchiectasis suggesting interstitial lung disease. There is also mild the bilateral axillary, subpectoral, supraclavicular, mediastinal adenopathy.lymph nodes are increased in number, mildly enlarged. Largest left axillary measuring 11 mm short axis. Findings favor reactive/systemic process with autoimmune disorders.   Patient had CT anginal chest PE protocol on 11/29/2016 which showed acute appearing nonconclusive pulmonary emboli within several segmental pulmonary artery branches to the left lower lobe. Small pleural-based consolidation with the posterior aspect of the left lower lobe presumably associated  focal pulmonary infarction. Associated small left pleural effusion. No evidence of associated right heart straining. Questionable focal low-density pulmonary embolus within the segmental pulmonary artery branch to the right middle lobe.   She has had CT abdomen pelvis with contrast done on Aug 19, 2017 image was independently reviewed by me.  Showed no acute intra-abdominal/pelvic process.  Mild amount of retained large bowel stool with mild colonic diverticulosis.  Bilateral lower lobe atelectasis and groundglass opacities which may be infectious or inflammatory.  Grade 1 L5-S1 anterolisthesis, bilateral L5 chronic pars interarticularis defects.  Severe left L5-S1 neuroforaminal narrowing  12/29/2017 CT angio chest PE 1. No demonstrable pulmonary embolus. No thoracic aortic aneurysm or dissection. 2. Extensive lower lobe pneumonia bilaterally involving multiple segments on each side. There is also infiltrate in the inferior Lingula. 3.  No demonstrable thoracic adenopathy.  4.  Gallbladder absent. ASSESSMENT & PLAN:  1. Chronic anticoagulation   2. Heterozygous factor V Leiden mutation ([place])   3. Rheumatoid arthritis involving multiple sites with positive rheumatoid factor ([place])   4. Pulmonary embolism without acute cor pulmonale, unspecified chronicity, unspecified pulmonary  embolism type ([place])   5. NSIP (nonspecific interstitial pneumonitis) ([place])    #She has tolerated chronic anticoagulation with Eliquis.  No bleeding events. I recommend continue Eliquis 5 mg twice daily Also she is made aware that safety of Eliquis has not been adequately studied in pregnancy.  Advised patient to update [place] if she plans to conceive.  Will recommend patient to follow-up with high risk OB/GYN at tertiary center. She will need be switched to Lovenox. Continue follow-up pulmonology for management of nonspecific interstitial pneumonitis. Follow up in 3 months Orders Placed This Encounter  Procedures  . Retic Panel    Standing Status:   Future    Number of Occurrences:   1    Standing Expiration Date:   05/07/2019  . CBC with Differential/Platelet    Standing Status:   Future    Standing Expiration Date:   05/07/2019  . Comprehensive metabolic panel    Standing Status:   Future    Standing Expiration Date:   05/07/2019    Earlie Server, MD, PhD Hematology Oncology [place] at [facility] Pager-, 05/08/2018

## 2018-05-27 DIAGNOSIS — J849 Interstitial pulmonary disease, unspecified: Secondary | ICD-10-CM | POA: Insufficient documentation

## 2018-08-03 ENCOUNTER — Other Ambulatory Visit: Payer: Self-pay

## 2018-08-03 ENCOUNTER — Inpatient Hospital Stay: Payer: BC Managed Care – PPO | Attending: Oncology

## 2018-08-03 DIAGNOSIS — Z7901 Long term (current) use of anticoagulants: Secondary | ICD-10-CM | POA: Diagnosis present

## 2018-08-03 LAB — COMPREHENSIVE METABOLIC PANEL
ALT: 20 U/L (ref 0–44)
AST: 23 U/L (ref 15–41)
Albumin: 4.4 g/dL (ref 3.5–5.0)
Alkaline Phosphatase: 37 U/L — ABNORMAL LOW (ref 38–126)
Anion gap: 8 (ref 5–15)
BUN: 15 mg/dL (ref 6–20)
CO2: 23 mmol/L (ref 22–32)
Calcium: 9.1 mg/dL (ref 8.9–10.3)
Chloride: 108 mmol/L (ref 98–111)
Creatinine, Ser: 0.92 mg/dL (ref 0.44–1.00)
GFR calc Af Amer: >60 mL/min (ref >60)
GFR calc non Af Amer: >60 mL/min (ref >60)
Glucose, Bld: 88 mg/dL (ref 70–99)
Potassium: 4.6 mmol/L (ref 3.5–5.1)
Sodium: 139 mmol/L (ref 135–145)
Total Bilirubin: 0.5 mg/dL (ref 0.3–1.2)
Total Protein: 7.4 g/dL (ref 6.5–8.1)

## 2018-08-03 LAB — CBC WITH DIFFERENTIAL/PLATELET
Abs Immature Granulocytes: 0.04 10*3/uL (ref 0.00–0.07)
Basophils Absolute: 0 10*3/uL (ref 0.0–0.1)
Basophils Relative: 0 %
Eosinophils Absolute: 0 10*3/uL (ref 0.0–0.5)
Eosinophils Relative: 1 %
HCT: 39.2 % (ref 36.0–46.0)
Hemoglobin: 12.9 g/dL (ref 12.0–15.0)
Immature Granulocytes: 1 %
Lymphocytes Relative: 8 %
Lymphs Abs: 0.5 10*3/uL — ABNORMAL LOW (ref 0.7–4.0)
MCH: 30.2 pg (ref 26.0–34.0)
MCHC: 32.9 g/dL (ref 30.0–36.0)
MCV: 91.8 fL (ref 80.0–100.0)
Monocytes Absolute: 0.5 10*3/uL (ref 0.1–1.0)
Monocytes Relative: 8 %
Neutro Abs: 5.5 10*3/uL (ref 1.7–7.7)
Neutrophils Relative %: 82 %
Platelets: 221 10*3/uL (ref 150–400)
RBC: 4.27 MIL/uL (ref 3.87–5.11)
RDW: 13.5 % (ref 11.5–15.5)
WBC: 6.7 10*3/uL (ref 4.0–10.5)
nRBC: 0 % (ref 0.0–0.2)

## 2018-08-04 ENCOUNTER — Inpatient Hospital Stay (HOSPITAL_BASED_OUTPATIENT_CLINIC_OR_DEPARTMENT_OTHER): Payer: BC Managed Care – PPO | Admitting: Oncology

## 2018-08-04 ENCOUNTER — Inpatient Hospital Stay: Payer: BC Managed Care – PPO

## 2018-08-04 ENCOUNTER — Encounter: Payer: Self-pay | Admitting: Oncology

## 2018-08-04 ENCOUNTER — Other Ambulatory Visit: Payer: Self-pay

## 2018-08-04 DIAGNOSIS — D6851 Activated protein C resistance: Secondary | ICD-10-CM

## 2018-08-04 DIAGNOSIS — J8489 Other specified interstitial pulmonary diseases: Secondary | ICD-10-CM

## 2018-08-04 DIAGNOSIS — D72810 Lymphocytopenia: Secondary | ICD-10-CM

## 2018-08-04 DIAGNOSIS — Z86711 Personal history of pulmonary embolism: Secondary | ICD-10-CM

## 2018-08-04 DIAGNOSIS — Z7901 Long term (current) use of anticoagulants: Secondary | ICD-10-CM | POA: Diagnosis not present

## 2018-08-04 MED ORDER — APIXABAN 2.5 MG PO TABS: 2.5000 mg | ORAL_TABLET | Freq: Two times a day (BID) | ORAL | 3 refills | Status: DC

## 2018-08-04 NOTE — Progress Notes (Signed)
Called patient today for Telehealth visit via [place].  Patient states no new concerns today.

## 2018-08-04 NOTE — Progress Notes (Signed)
HEMATOLOGY-ONCOLOGY TeleHEALTH VISIT PROGRESS NOTE  I connected with Pamela Mills on 08/04/18 at  1:45 PM EDT by video enabled telemedicine visit and verified that I am speaking with the correct person using two identifiers. I discussed the limitations, risks, security and privacy concerns of performing an evaluation and management service by telemedicine and the availability of in-person appointments. I also discussed with the patient that there may be a patient responsible charge related to this service. The patient expressed understanding and agreed to proceed.   Other persons participating in the visit and their role in the encounter:  Geraldine Solar, [place], check in patient   Janeann Merl, RN, check in patient.   Patient's location: Home  Provider's location: Home office Chief Complaint: Follow-up for management of chronic anticoagulation for history of unprovoked PE.   INTERVAL HISTORY Pamela Mills is a 38 y.o. female who has above history reviewed by me today presents for follow up visit for management of chronic anticoagulation for history of unprovoked PE. Problems and complaints are listed below:  Patient reports feeling well at baseline.  No recent hospitalization, new medication.  She continues to have some chronic shortness of breath exacerbated with exertion.  Overall at baseline.  Denies any cough, hemoptysis. Patient has been taking Eliquis 5 mg twice daily, tolerating well.Denies hematochezia, hematuria, hematemesis, epistaxis, black tarry stool or easy bruising.  Due to the [place] pandemic, she has been working remotely from home.  She also decides to postpone her family planning/conceiving. Denies any fever, chills, nausea, vomiting, chest pain, abdominal pain lower extremity swelling. Interstitial lung disease associated with rheumatoid arthritis, currently on CellCept, and prednisone.  Also is on Bactrim 3 times a week. Review of Systems  Constitutional: Negative for  appetite change, chills, fatigue and fever.  HENT:   Negative for hearing loss and voice change.   Eyes: Negative for eye problems.  Respiratory: Positive for shortness of breath. Negative for chest tightness and cough.   Cardiovascular: Negative for chest pain.  Gastrointestinal: Negative for abdominal distention, abdominal pain and blood in stool.  Endocrine: Negative for hot flashes.  Genitourinary: Negative for difficulty urinating and frequency.   Musculoskeletal: Negative for arthralgias.  Skin: Negative for itching and rash.  Neurological: Negative for extremity weakness.  Hematological: Negative for adenopathy.  Psychiatric/Behavioral: Negative for confusion.    Past Medical History:  Diagnosis Date  . Anxiety   . Arthritis   . Cancer ([place])    skin  . Collagen vascular disease ([place])   . Depression   . Diverticulitis   . Factor 5 Leiden mutation, heterozygous ([place]) 2018  . GERD (gastroesophageal reflux disease)   . Migraines   . Preterm labor   . Pulmonary embolism ([place])   . Vaginal Pap smear, abnormal    Past Surgical History:  Procedure Laterality Date  . CHOLECYSTECTOMY N/A 06/29/2015   Procedure: LAPAROSCOPIC CHOLECYSTECTOMY;  Surgeon: Stark Klein, MD;  Location: [facility];  Service: General;  Laterality: N/A;  . CHROMOPERTUBATION Right 10/17/2017   Procedure: CHROMOPERTUBATION;  Surgeon: Ward, Honor Loh, MD;  Location: [facility];  Service: Gynecology;  Laterality: Right;  . [place] OF UTERUS  2016  . DILATION AND EVACUATION N/A 11/10/2014   Procedure: DILATATION AND EVACUATION;  Surgeon: Honor Loh Ward, MD;  Location: [facility];  Service: Gynecology;  Laterality: N/A;  . FLEXIBLE BRONCHOSCOPY N/A 02/18/2018   Procedure: FLEXIBLE BRONCHOSCOPY;  Surgeon: Ottie Glazier, MD;  Location: [facility];  Service: Thoracic;  Laterality: N/A;  . FOOT SURGERY Left 01/2012  . FOOT SURGERY Left 2013   Plantar Fasiciatis  . LAPAROSCOPIC ENDOMETRIOSIS  FULGURATION  2006  . LAPAROSCOPIC UNILATERAL SALPINGO OOPHERECTOMY Left 10/17/2017   Procedure: LAPAROSCOPIC UNILATERAL SALPINGO OOPHORECTOMY;  Surgeon: Ward, Honor Loh, MD;  Location: [facility];  Service: Gynecology;  Laterality: Left;  . LAPAROSCOPY N/A 10/17/2017   Procedure: LAPAROSCOPY OPERATIVE, Raeanne Gathers;  Surgeon: Ward, Honor Loh, MD;  Location: [facility];  Service: Gynecology;  Laterality: N/A;  . oophotectomy Left 09/2017   2 cysts on the same side also removed  . UPPER GI ENDOSCOPY      Family History  Problem Relation Age of Onset  . Ovarian cancer Mother 91  . Rheumatologic disease Maternal Aunt   . Pancreatic cancer Maternal Grandfather   . Colon cancer Paternal Grandmother   . Breast cancer Neg Hx     Social History   Socioeconomic History  . Marital status: Married    Spouse name: [name]  . Number of children: Not on file  . Years of education: Not on file  . Highest education level: Not on file  Occupational History  . Not on file  Social Needs  . Financial resource strain: Not on file  . Food insecurity:    Worry: Not on file    Inability: Not on file  . Transportation needs:    Medical: Not on file    Non-medical: Not on file  Tobacco Use  . Smoking status: Never Smoker  . Smokeless tobacco: Never Used  Substance and Sexual Activity  . Alcohol use: No  . Drug use: No  . Sexual activity: Not Currently  Lifestyle  . Physical activity:    Days per week: Not on file    Minutes per session: Not on file  . Stress: Not on file  Relationships  . Social connections:    Talks on phone: Not on file    Gets together: Not on file    Attends religious service: Not on file    Active member of club or organization: Not on file    Attends meetings of clubs or organizations: Not on file    Relationship status: Not on file  . Intimate partner violence:    Fear of current or ex partner: Not on file    Emotionally abused: Not on file    Physically abused:  Not on file    Forced sexual activity: Not on file  Other Topics Concern  . Not on file  Social History Narrative  . Not on file    Current Outpatient Medications on File Prior to Visit  Medication Sig Dispense Refill  . acetaminophen (TYLENOL) 500 MG tablet Take 1,000 mg by mouth every 8 (eight) hours as needed (pain).    . cetirizine (ZYRTEC) 10 MG tablet Take 10 mg by mouth daily.    . Cholecalciferol (VITAMIN D3) 1000 units CAPS Take 1,000 Units by mouth daily.    Marland Kitchen escitalopram (LEXAPRO) 5 MG tablet Take 15 mg by mouth every evening.     . folic acid (FOLVITE) 1 MG tablet Take 1 mg by mouth daily.  11  . Galcanezumab-gnlm 120 MG/ML SOAJ Inject 120 mg into the skin every 28 (twenty-eight) days.    Marland Kitchen LORazepam (ATIVAN) 0.5 MG tablet Take 0.5 mg by mouth 2 (two) times daily as needed for anxiety.     . mycophenolate (CELLCEPT) 500 MG tablet Take 1,500 mg by mouth 2 (two) times a  day.    . omeprazole (PRILOSEC) 20 MG capsule Take 20 mg by mouth daily.     . predniSONE (DELTASONE) 10 MG tablet Take 10 mg by mouth daily.     Marland Kitchen sulfamethoxazole-trimethoprim (BACTRIM DS,SEPTRA DS) 800-160 MG tablet Take 1 tablet by mouth every Monday, Wednesday and Friday    . sulfaSALAzine (AZULFIDINE) 500 MG EC tablet Take 500 mg by mouth 2 (two) times daily.  0  . SUMAtriptan (IMITREX) 50 MG tablet Take 50 mg by mouth every 2 (two) hours as needed for migraine.     . topiramate (TOPAMAX) 25 MG tablet Take 50 mg by mouth daily.     Marland Kitchen triamcinolone (NASACORT ALLERGY 24HR) 55 MCG/ACT AERO nasal inhaler Place 2 sprays into the nose daily.    . vitamin B-12 (CYANOCOBALAMIN) 1000 MCG tablet Take 1,000 mcg by mouth daily.     No current facility-administered medications on file prior to visit.     Allergies  Allergen Reactions  . Other Other (See Comments)    Gluten causes inflammation in GI system.  . Gluten Meal Diarrhea and Nausea And Vomiting       Observations/Objective: There were no vitals filed  for this visit. There is no height or weight on file to calculate BMI.  Pain level 0 Physical Exam  Constitutional: She is oriented to person, place, and time. No distress.  HENT:  Head: Normocephalic and atraumatic.  Pulmonary/Chest: Effort normal.  Neurological: She is alert and oriented to person, place, and time.  Psychiatric: Affect normal.    CBC    Component Value Date/Time   WBC 6.7 08/03/2018 1318   RBC 4.27 08/03/2018 1318   HGB 12.9 08/03/2018 1318   HGB 13.0 08/09/2011 1658   HCT 39.2 08/03/2018 1318   HCT 37.6 08/09/2011 1658   PLT 221 08/03/2018 1318   PLT 175 08/09/2011 1658   MCV 91.8 08/03/2018 1318   MCV 88 08/09/2011 1658   MCH 30.2 08/03/2018 1318   MCHC 32.9 08/03/2018 1318   RDW 13.5 08/03/2018 1318   RDW 12.6 08/09/2011 1658   LYMPHSABS 0.5 (L) 08/03/2018 1318   MONOABS 0.5 08/03/2018 1318   EOSABS 0.0 08/03/2018 1318   BASOSABS 0.0 08/03/2018 1318    CMP     Component Value Date/Time   NA 139 08/03/2018 1318   NA 142 08/09/2011 1658   K 4.6 08/03/2018 1318   K 3.7 08/09/2011 1658   CL 108 08/03/2018 1318   CL 108 (H) 08/09/2011 1658   CO2 23 08/03/2018 1318   CO2 24 08/09/2011 1658   GLUCOSE 88 08/03/2018 1318   GLUCOSE 93 08/09/2011 1658   BUN 15 08/03/2018 1318   BUN 10 08/09/2011 1658   CREATININE 0.92 08/03/2018 1318   CREATININE 0.98 08/09/2011 1658   CALCIUM 9.1 08/03/2018 1318   CALCIUM 8.6 08/09/2011 1658   PROT 7.4 08/03/2018 1318   PROT 7.8 04/08/2011 1714   ALBUMIN 4.4 08/03/2018 1318   ALBUMIN 4.1 04/08/2011 1714   AST 23 08/03/2018 1318   AST 22 04/08/2011 1714   ALT 20 08/03/2018 1318   ALT 21 04/08/2011 1714   ALKPHOS 37 (L) 08/03/2018 1318   ALKPHOS 37 (L) 04/08/2011 1714   BILITOT 0.5 08/03/2018 1318   BILITOT 0.6 04/08/2011 1714   GFRNONAA >60 08/03/2018 1318   GFRNONAA >60 08/09/2011 1658   GFRAA >60 08/03/2018 1318   GFRAA >60 08/09/2011 1658     Assessment and Plan: 1. Chronic anticoagulation  2.  Heterozygous factor V Leiden mutation ([place])   3. History of pulmonary embolism   4. NSIP (nonspecific interstitial pneumonitis) ([place])   5. Lymphocytopenia     Labs are reviewed and discussed with patient. Stable hemoglobin, kidney and liver function. Mild leukocytopenia, asymptomatic no frequent.  Likely drug-induced.  Continue to monitor.  Continue prophylactic Bactrim.  Chronic anticoagulation for history of unprovoked pulmonary embolism.  Interstitial pneumonitis have been relatively well controlled recently.  I have discussed with patient about reducing Eliquis to 2.5 mg twice daily as maintenance treatment.  She agrees with the plan.  Prescription sent to pharmacy. Patient notes that safety of Eliquis has not been adequately studied in pregnancy.  Advised patient to update [place] if she plans to conceive.  Will recommend switch to Lovenox at that time.  She also need to follow-up with high risk OB/GYN at tertiary center for prenatal care.    Follow Up Instructions: Lab MD 3 months.   I discussed the assessment and treatment plan with the patient. The patient was provided an opportunity to ask questions and all were answered. The patient agreed with the plan and demonstrated an understanding of the instructions.  The patient was advised to call back or seek an in-person evaluation if the symptoms worsen or if the condition fails to improve as anticipated.   I provided 15 minutes of face-to-face video visit time during this encounter, and > 50% was spent counseling as documented under my assessment & plan.  Earlie Server, MD 08/04/2018 11:25 PM

## 2018-08-10 ENCOUNTER — Encounter: Payer: Self-pay | Admitting: Anesthesiology

## 2018-11-03 ENCOUNTER — Other Ambulatory Visit: Payer: BC Managed Care – PPO

## 2018-11-03 ENCOUNTER — Ambulatory Visit: Payer: BC Managed Care – PPO | Admitting: Oncology

## 2018-11-05 ENCOUNTER — Other Ambulatory Visit: Payer: Self-pay

## 2018-11-05 ENCOUNTER — Inpatient Hospital Stay (HOSPITAL_BASED_OUTPATIENT_CLINIC_OR_DEPARTMENT_OTHER): Payer: BC Managed Care – PPO | Admitting: Oncology

## 2018-11-05 ENCOUNTER — Encounter: Payer: Self-pay | Admitting: Oncology

## 2018-11-05 ENCOUNTER — Inpatient Hospital Stay: Payer: BC Managed Care – PPO | Attending: Oncology

## 2018-11-05 VITALS — BP 126/93 | Temp 97.8°F | Resp 18 | Wt 252.2 lb

## 2018-11-05 DIAGNOSIS — Z79899 Other long term (current) drug therapy: Secondary | ICD-10-CM | POA: Diagnosis not present

## 2018-11-05 DIAGNOSIS — Z7901 Long term (current) use of anticoagulants: Secondary | ICD-10-CM | POA: Insufficient documentation

## 2018-11-05 DIAGNOSIS — J8489 Other specified interstitial pulmonary diseases: Secondary | ICD-10-CM | POA: Diagnosis not present

## 2018-11-05 DIAGNOSIS — D6851 Activated protein C resistance: Secondary | ICD-10-CM | POA: Diagnosis not present

## 2018-11-05 DIAGNOSIS — Z8041 Family history of malignant neoplasm of ovary: Secondary | ICD-10-CM | POA: Diagnosis not present

## 2018-11-05 DIAGNOSIS — Z86711 Personal history of pulmonary embolism: Secondary | ICD-10-CM | POA: Diagnosis present

## 2018-11-05 DIAGNOSIS — F419 Anxiety disorder, unspecified: Secondary | ICD-10-CM | POA: Insufficient documentation

## 2018-11-05 LAB — CBC WITH DIFFERENTIAL/PLATELET
Abs Immature Granulocytes: 0 10*3/uL (ref 0.00–0.07)
Basophils Absolute: 0 10*3/uL (ref 0.0–0.1)
Basophils Relative: 1 %
Eosinophils Absolute: 0.1 10*3/uL (ref 0.0–0.5)
Eosinophils Relative: 2 %
HCT: 39.7 % (ref 36.0–46.0)
Hemoglobin: 13.2 g/dL (ref 12.0–15.0)
Immature Granulocytes: 0 %
Lymphocytes Relative: 20 %
Lymphs Abs: 0.7 10*3/uL (ref 0.7–4.0)
MCH: 29.5 pg (ref 26.0–34.0)
MCHC: 33.2 g/dL (ref 30.0–36.0)
MCV: 88.6 fL (ref 80.0–100.0)
Monocytes Absolute: 0.4 10*3/uL (ref 0.1–1.0)
Monocytes Relative: 12 %
Neutro Abs: 2.3 10*3/uL (ref 1.7–7.7)
Neutrophils Relative %: 65 %
Platelets: 214 10*3/uL (ref 150–400)
RBC: 4.48 MIL/uL (ref 3.87–5.11)
RDW: 13.2 % (ref 11.5–15.5)
WBC: 3.6 10*3/uL — ABNORMAL LOW (ref 4.0–10.5)
nRBC: 0 % (ref 0.0–0.2)

## 2018-11-05 LAB — COMPREHENSIVE METABOLIC PANEL
ALT: 17 U/L (ref 0–44)
AST: 22 U/L (ref 15–41)
Albumin: 4.4 g/dL (ref 3.5–5.0)
Alkaline Phosphatase: 38 U/L (ref 38–126)
Anion gap: 9 (ref 5–15)
BUN: 14 mg/dL (ref 6–20)
CO2: 22 mmol/L (ref 22–32)
Calcium: 9.3 mg/dL (ref 8.9–10.3)
Chloride: 106 mmol/L (ref 98–111)
Creatinine, Ser: 1 mg/dL (ref 0.44–1.00)
GFR calc Af Amer: >60 mL/min (ref >60)
GFR calc non Af Amer: >60 mL/min (ref >60)
Glucose, Bld: 99 mg/dL (ref 70–99)
Potassium: 3.8 mmol/L (ref 3.5–5.1)
Sodium: 137 mmol/L (ref 135–145)
Total Bilirubin: 0.8 mg/dL (ref 0.3–1.2)
Total Protein: 7.3 g/dL (ref 6.5–8.1)

## 2018-11-05 NOTE — Progress Notes (Signed)
Hematology/Oncology follow-up [place]  Patient Care Team: Adin Hector, MD as PCP - General (Internal Medicine)  Reason for visit Follow-up for PE and low protein S level  Referring Physician: Dr.Fleming Herbon.  HISTORY OF PRESENTING ILLNESS:  Pamela Mills 38 y.o.  female with past medical history listed as below who  follow-up for management of anticoagulation due to recent PE. Since  November 2017, patient started to experience persistent dry cough, exertional shortness of breath, progressive swelling in hands and feet, she was referred to see rheumatology. Workup showed seropositive rheumatoid arthritis, Negative anti-CCP but elevated RF Patient was  Prednisone and later started on methotrexate weekly (started on 08/15/2016) for treatment.. Patient's respiratory symptoms improved and then worsen again. At that point she was referred by her primary care to see Dr. Raul Del pulmonary for evaluation. CT without contrast on 07/30/2016 showed basilar and apparent from predominant ground glass opacities in the architectural distortion, with possible minimum lower lobe bronchiectasis suggesting interstitial lung disease. There is also mild the bilateral axillary, subpectoral, supraclavicular, mediastinal adenopathy.lymph nodes are increased in number, mildly enlarged. Largest left axillary measuring 11 mm short axis. Findings favor reactive/systemic process with autoimmune disorders.  Patient's rheumatology symptoms improved with treatment. Patient had wax and wane of her respiratory symptoms. In the summer, she has had 2 round trips of 8 hour car ride back and forth to [place] where her father lives. Most recent one was on August 5. She has felt lower extremity swelling for which she has to put on stocking to reduce swelling. Also she felt a little stretchy feeling on her cough. She mentioned to Dr. Raul Del who ordered a follow-up CT scan. CT without contrast chest on  11/26/2016 showed patchy groundglass in the septal thickening in both lower lobes, less severe than 07/27/2016. There is new rounded consolidation in the left lower lobe, possible infectious etiology. No definitive evidence of interstitial lung disease. Meanwhile her respiratory symptoms has become worse. She has experienced coughing, exertional shortness of breath, and mild hemoptysis. This triggered a d-dimer testing which came back high. Patient had CT anginal chest PE protocol on 11/29/2016 which showed acute appearing nonconclusive pulmonary emboli within several segmental pulmonary artery branches to the left lower lobe. Small pleural-based consolidation with the posterior aspect of the left lower lobe presumably associated focal pulmonary infarction. Associated small left pleural effusion. No evidence of associated right heart straining. Questionable focal low-density pulmonary embolus within the segmental pulmonary artery branch to the right middle lobe.   lower extremity ultrasound revealed no DVT within the right lower extremity. Patient was started on Eliquis 5 mg twice a day.  Patient has had some rheumatology/hypercoagulable workup done with [facility]. Reviewing her labs showed on 08/08/2016, she is tested negative for anti-Cardiolite pain antibody IgG < 9, IgM slightly elevated at 15. Beta-2 glycoprotein IgG and IgM normal, normal axial coronal phase phospholipid, DVvT normal, negative lupus anticoagulant.. Serum protein electrophoresis negative for M spike.  11/29/2016, homocystine 8, lupus anticoagulant was retested was negative, mildly low protein S activity at 48, normal protein S antigen. Slightly low protein S antigen. Normal anti-thrombin function. She is tested positive for single R506Q mutation heterozygous factor V Leiden mutation.  Patient has a family history of ovarian cancer in her mom. She reports that she was tested negative for genetic test.  She takes OCP since she was  teenage.  This is her fist episode of blood clot. She was taking of OCP and  referred to see gynecology.  # 06/13/2017 Antiphospholipid panel was checked at last visit again.  Which showed negative panel except slightly elevated anti cardiolipin IgM at 15.  Repeat protein S activity was 75%, protein S antigen at 52, mildly decreased.  slight positive anti cardiolipin IgM does not meet criteria for Antiphospholipid syndrome  #  was also seen by GYN Dr.Ward and she has had pelvic ultrasound which showed ovarian cysts, which may be contributing to her left lower quadrant pain.. nextplanon has been taken out.  INTERVAL HISTORY Pamela Mills is a 38 y.o. female who has above history reviewed by me today presents for follow-up management of pulmonary embolism.  Patient reports doing well. Continues to tolerate chronic anticoagulation with Eliquis 2.5 mg twice daily. Breathing is at baseline. Some shortness of breath when she exerts herself. She follows up with pulmonology for interstitial pneumonitis. Currently on CellCept and prednisone, with the plan of tapering prednisone off. On Bactrim DS Monday Wednesday Friday. . Denies any cough.  She works from home during [employer] pandemic. Has gained weight  ROS:  Review of Systems  Constitutional: Negative for appetite change, chills, fatigue and fever.  HENT:   Negative for hearing loss and voice change.   Eyes: Negative for eye problems.  Respiratory: Negative for chest tightness and cough.   Cardiovascular: Negative for chest pain.  Gastrointestinal: Negative for abdominal distention, abdominal pain and blood in stool.  Endocrine: Negative for hot flashes.  Genitourinary: Negative for difficulty urinating and frequency.   Musculoskeletal: Negative for arthralgias.  Skin: Negative for itching and rash.  Neurological: Negative for extremity weakness.  Hematological: Negative for adenopathy.  Psychiatric/Behavioral: Negative for confusion.     MEDICAL HISTORY:  Past Medical History:  Diagnosis Date  . Anxiety   . Arthritis   . Cancer ([place])    skin  . Collagen vascular disease ([place])   . Depression   . Diverticulitis   . Factor 5 Leiden mutation, heterozygous ([place]) 2018  . GERD (gastroesophageal reflux disease)   . Migraines   . Preterm labor   . Pulmonary embolism ([place])   . Vaginal Pap smear, abnormal     SURGICAL HISTORY: Past Surgical History:  Procedure Laterality Date  . CHOLECYSTECTOMY N/A 06/29/2015   Procedure: LAPAROSCOPIC CHOLECYSTECTOMY;  Surgeon: Stark Klein, MD;  Location: [facility];  Service: General;  Laterality: N/A;  . CHROMOPERTUBATION Right 10/17/2017   Procedure: CHROMOPERTUBATION;  Surgeon: Ward, Honor Loh, MD;  Location: [facility];  Service: Gynecology;  Laterality: Right;  . [place] OF UTERUS  2016  . DILATION AND EVACUATION N/A 11/10/2014   Procedure: DILATATION AND EVACUATION;  Surgeon: Honor Loh Ward, MD;  Location: [facility];  Service: Gynecology;  Laterality: N/A;  . FLEXIBLE BRONCHOSCOPY N/A 02/18/2018   Procedure: FLEXIBLE BRONCHOSCOPY;  Surgeon: Ottie Glazier, MD;  Location: [facility];  Service: Thoracic;  Laterality: N/A;  . FOOT SURGERY Left 01/2012  . FOOT SURGERY Left 2013   Plantar Fasiciatis  . LAPAROSCOPIC ENDOMETRIOSIS FULGURATION  2006  . LAPAROSCOPIC UNILATERAL SALPINGO OOPHERECTOMY Left 10/17/2017   Procedure: LAPAROSCOPIC UNILATERAL SALPINGO OOPHORECTOMY;  Surgeon: Ward, Honor Loh, MD;  Location: [facility];  Service: Gynecology;  Laterality: Left;  . LAPAROSCOPY N/A 10/17/2017   Procedure: LAPAROSCOPY OPERATIVE, Raeanne Gathers;  Surgeon: Ward, Honor Loh, MD;  Location: [facility];  Service: Gynecology;  Laterality: N/A;  . oophotectomy Left 09/2017   2 cysts on the same side also removed  . UPPER GI ENDOSCOPY  SOCIAL HISTORY: Social History   Socioeconomic History  . Marital status: Married    Spouse name: [name]  . Number of  children: Not on file  . Years of education: Not on file  . Highest education level: Not on file  Occupational History  . Not on file  Social Needs  . Financial resource strain: Not on file  . Food insecurity    Worry: Not on file    Inability: Not on file  . Transportation needs    Medical: Not on file    Non-medical: Not on file  Tobacco Use  . Smoking status: Never Smoker  . Smokeless tobacco: Never Used  Substance and Sexual Activity  . Alcohol use: No  . Drug use: No  . Sexual activity: Not Currently  Lifestyle  . Physical activity    Days per week: Not on file    Minutes per session: Not on file  . Stress: Not on file  Relationships  . Social Herbalist on phone: Not on file    Gets together: Not on file    Attends religious service: Not on file    Active member of club or organization: Not on file    Attends meetings of clubs or organizations: Not on file    Relationship status: Not on file  . Intimate partner violence    Fear of current or ex partner: Not on file    Emotionally abused: Not on file    Physically abused: Not on file    Forced sexual activity: Not on file  Other Topics Concern  . Not on file  Social History Narrative  . Not on file    FAMILY HISTORY: Family History  Problem Relation Age of Onset  . Ovarian cancer Mother 30  . Rheumatologic disease Maternal Aunt   . Pancreatic cancer Maternal Grandfather   . Colon cancer Paternal Grandmother   . Breast cancer Neg Hx     ALLERGIES:  is allergic to other and gluten meal.  MEDICATIONS:  Current Outpatient Medications  Medication Sig Dispense Refill  . acetaminophen (TYLENOL) 500 MG tablet Take 1,000 mg by mouth every 8 (eight) hours as needed (pain).    Marland Kitchen apixaban (ELIQUIS) 2.5 MG TABS tablet Take 1 tablet (2.5 mg total) by mouth 2 (two) times daily. 60 tablet 3  . cetirizine (ZYRTEC) 10 MG tablet Take 10 mg by mouth daily.    . Cholecalciferol (VITAMIN D3) 1000 units CAPS Take  1,000 Units by mouth daily.    Marland Kitchen escitalopram (LEXAPRO) 5 MG tablet Take 15 mg by mouth every evening.     . folic acid (FOLVITE) 1 MG tablet Take 1 mg by mouth daily.  11  . LORazepam (ATIVAN) 0.5 MG tablet Take 0.5 mg by mouth 2 (two) times daily as needed for anxiety.     . mycophenolate (CELLCEPT) 500 MG tablet Take 1,500 mg by mouth 2 (two) times a day.    Marland Kitchen omeprazole (PRILOSEC) 20 MG capsule Take 20 mg by mouth daily.     . predniSONE (DELTASONE) 10 MG tablet Take 10 mg by mouth daily. 5 mg QOD    . sulfamethoxazole-trimethoprim (BACTRIM DS,SEPTRA DS) 800-160 MG tablet Take 1 tablet by mouth every Monday, Wednesday and Friday    . sulfaSALAzine (AZULFIDINE) 500 MG EC tablet Take 500 mg by mouth 2 (two) times daily.  0  . SUMAtriptan (IMITREX) 50 MG tablet Take 50 mg by mouth every 2 (two) hours  as needed for migraine.     . topiramate (TOPAMAX) 25 MG tablet Take 50 mg by mouth daily.     Marland Kitchen triamcinolone (NASACORT ALLERGY 24HR) 55 MCG/ACT AERO nasal inhaler Place 2 sprays into the nose daily.    . vitamin B-12 (CYANOCOBALAMIN) 1000 MCG tablet Take 1,000 mcg by mouth daily.    . Galcanezumab-gnlm 120 MG/ML SOAJ Inject 120 mg into the skin every 28 (twenty-eight) days.     No current facility-administered medications for this visit.       Marland Kitchen  PHYSICAL EXAMINATION: ECOG PERFORMANCE STATUS: 0 - Asymptomatic Vitals:   11/05/18 0901  BP: (!) 126/93  Resp: 18  Temp: 97.8 F (36.6 C)   Filed Weights   11/05/18 0901  Weight: 252 lb 3.2 oz (114.4 kg)   Physical Exam  Constitutional: She is oriented to person, place, and time. No distress.  HENT:  Head: Normocephalic and atraumatic.  Nose: Nose normal.  Mouth/Throat: Oropharynx is clear and moist. No oropharyngeal exudate.  Eyes: Pupils are equal, round, and reactive to light. Conjunctivae and EOM are normal. Left eye exhibits no discharge. No scleral icterus.  Neck: Normal range of motion. Neck supple.  Cardiovascular: Normal  rate, regular rhythm and normal heart sounds.  No murmur heard. Pulmonary/Chest: Effort normal and breath sounds normal. No respiratory distress. She has no wheezes. She has no rales. She exhibits no tenderness.  Abdominal: Soft. Bowel sounds are normal. She exhibits no distension and no mass. There is no abdominal tenderness. There is no rebound.  Musculoskeletal: Normal range of motion.        General: No edema.  Lymphadenopathy:    She has no cervical adenopathy.  Neurological: She is alert and oriented to person, place, and time. No cranial nerve deficit. She exhibits normal muscle tone. Coordination normal.  Skin: Skin is warm and dry. She is not diaphoretic. No erythema.  Psychiatric: Affect normal.      LABORATORY DATA:  I have reviewed the data as listed Patient has had some rheumatology/hypercoagulable workup done with [facility]. Reviewing her labs showed on 08/08/2016, she is tested negative for anti-Cardiolite pain antibody IgG < 9, IgM slightly elevated at 15. Beta-2 glycoprotein IgG and IgM normal, normal axial coronal phase phospholipid, DVvT normal, negative lupus anticoagulant.. Serum protein electrophoresis negative for M spike. Onto ferritin negative. 11/29/2016, homocystine 8, lupus anticoagulant was retested was negative, mildly low protein S activity at 48, normal protein S antigen. Slightly low protein S antigen. Normal anti-thrombin function. She is tested positive for single R506Q mutation heterozygous factor V Leiden mutation.  RADIOGRAPHIC STUDIES: I have personally reviewed the radiological images as listed and agreed with the findings in the report.CT without contrast on 07/30/2016 showed basilar and apparent from predominant ground glass opacities in the architectural distortion, with possible minimum lower lobe bronchiectasis suggesting interstitial lung disease. There is also mild the bilateral axillary, subpectoral, supraclavicular, mediastinal  adenopathy.lymph nodes are increased in number, mildly enlarged. Largest left axillary measuring 11 mm short axis. Findings favor reactive/systemic process with autoimmune disorders.   Patient had CT anginal chest PE protocol on 11/29/2016 which showed acute appearing nonconclusive pulmonary emboli within several segmental pulmonary artery branches to the left lower lobe. Small pleural-based consolidation with the posterior aspect of the left lower lobe presumably associated focal pulmonary infarction. Associated small left pleural effusion. No evidence of associated right heart straining. Questionable focal low-density pulmonary embolus within the segmental pulmonary artery branch to the right middle lobe.  She has had CT abdomen pelvis with contrast done on Aug 19, 2017 image was independently reviewed by me.  Showed no acute intra-abdominal/pelvic process.  Mild amount of retained large bowel stool with mild colonic diverticulosis.  Bilateral lower lobe atelectasis and groundglass opacities which may be infectious or inflammatory.  Grade 1 L5-S1 anterolisthesis, bilateral L5 chronic pars interarticularis defects.  Severe left L5-S1 neuroforaminal narrowing  12/29/2017 CT angio chest PE 1. No demonstrable pulmonary embolus. No thoracic aortic aneurysm or dissection. 2. Extensive lower lobe pneumonia bilaterally involving multiple segments on each side. There is also infiltrate in the inferior Lingula. 3.  No demonstrable thoracic adenopathy.  4.  Gallbladder absent. ASSESSMENT & PLAN:  1. Chronic anticoagulation   2. Heterozygous factor V Leiden mutation ([place])   3. History of pulmonary embolism   4. NSIP (nonspecific interstitial pneumonitis) ([place])    Labs are reviewed and discussed with patient. Clinically she is doing well, tolerating chronic anticoagulation with Eliquis 2.5 mg twice daily. Continue long-term anticoagulation maintenance with current regimen.  Continue follow-up with  pulmonology for interstitial pneumonitis. Weight gain is most likely secondary to prednisone use. Discussed about diet and exercise. Patient is aware that Eliquis has not been well studied in pregnancy.  She will need to be switched to Lovenox if she plans to conceive.  Follow up in 6 months Orders Placed This Encounter  Procedures  . CBC with Differential/Platelet    Standing Status:   Future    Standing Expiration Date:   11/05/2019  . Comprehensive metabolic panel    Standing Status:   Future    Standing Expiration Date:   11/05/2019    Earlie Server, MD, PhD Hematology Oncology [place] at [facility] Pager-, 11/05/2018

## 2018-11-05 NOTE — Progress Notes (Signed)
Patient does not offer any problems today.  

## 2018-11-13 ENCOUNTER — Encounter: Payer: Self-pay | Admitting: Oncology

## 2018-11-17 ENCOUNTER — Encounter: Payer: Self-pay | Admitting: Oncology

## 2018-11-17 ENCOUNTER — Other Ambulatory Visit: Payer: Self-pay

## 2018-11-17 ENCOUNTER — Ambulatory Visit
Admission: RE | Admit: 2018-11-17 | Discharge: 2018-11-17 | Disposition: A | Discharge status: Home / Self Care | Payer: BC Managed Care – PPO | Source: Ambulatory Visit | Attending: Oncology | Admitting: Oncology

## 2018-11-17 ENCOUNTER — Inpatient Hospital Stay (HOSPITAL_BASED_OUTPATIENT_CLINIC_OR_DEPARTMENT_OTHER): Payer: BC Managed Care – PPO | Admitting: Oncology

## 2018-11-17 DIAGNOSIS — M7989 Other specified soft tissue disorders: Secondary | ICD-10-CM | POA: Diagnosis not present

## 2018-11-17 DIAGNOSIS — Z86711 Personal history of pulmonary embolism: Secondary | ICD-10-CM | POA: Insufficient documentation

## 2018-11-17 DIAGNOSIS — D6851 Activated protein C resistance: Secondary | ICD-10-CM

## 2018-11-17 DIAGNOSIS — Z20822 Contact with and (suspected) exposure to covid-19: Secondary | ICD-10-CM

## 2018-11-17 DIAGNOSIS — J8489 Other specified interstitial pulmonary diseases: Secondary | ICD-10-CM | POA: Diagnosis not present

## 2018-11-17 DIAGNOSIS — Z7901 Long term (current) use of anticoagulants: Secondary | ICD-10-CM

## 2018-11-17 NOTE — Progress Notes (Signed)
HEMATOLOGY-ONCOLOGY TeleHEALTH VISIT PROGRESS NOTE  I connected with Pamela Mills on 11/17/18 at  8:30 AM EDT by video enabled telemedicine visit and verified that I am speaking with the correct person using two identifiers. I discussed the limitations, risks, security and privacy concerns of performing an evaluation and management service by telemedicine and the availability of in-person appointments. I also discussed with the patient that there may be a patient responsible charge related to this service. The patient expressed understanding and agreed to proceed.   Other persons participating in the visit and their role in the encounter:  [facility]   Patient's location: Home  Provider's location:office Chief Complaint: Lower extremity swelling   INTERVAL HISTORY Pamela Mills is a 38 y.o. female who has above history reviewed by me today presents for follow up visit for management of lower extremity swelling. Problems and complaints are listed below:  Patient was recently seen by me for routine follow-up for history of PE, chronic anticoagulation.  She was doing well at that time.  Denies any significant leg swelling, shortness of breath. 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  She has noticed increasing bilateral lower extremity swelling, left worse than right since the start of school year.  Denies any calf tenderness or erythema of lower extremity. She also started to have mild cough, sore throat, mild shortness of breath.  Denies any recent exposure to COVID-19. No fever or chills.  Denies any nausea, vomiting, diarrhea. Patient takes Eliquis 2.5 mg twice daily as maintenance. Review of Systems  Constitutional: Negative for appetite change, chills, fatigue and fever.  HENT:   Positive for sore throat. Negative for hearing loss and voice change.   Eyes: Negative for eye problems.  Respiratory:  Positive for cough. Negative for chest tightness.   Cardiovascular: Positive for leg swelling. Negative for chest pain.  Gastrointestinal: Negative for abdominal distention, abdominal pain and blood in stool.  Endocrine: Negative for hot flashes.  Genitourinary: Negative for difficulty urinating and frequency.   Musculoskeletal: Negative for arthralgias.  Skin: Negative for itching and rash.  Neurological: Negative for extremity weakness.  Hematological: Negative for adenopathy.  Psychiatric/Behavioral: Negative for confusion.    Past Medical History:  Diagnosis Date  . Anxiety   . Arthritis   . Cancer ([place])    skin  . Collagen vascular disease ([place])   . Depression   . Diverticulitis   . Factor 5 Leiden mutation, heterozygous ([place]) 2018  . GERD (gastroesophageal reflux disease)   . Migraines   . Preterm labor   . Pulmonary embolism ([place])   . Vaginal Pap smear, abnormal    Past Surgical History:  Procedure Laterality Date  . CHOLECYSTECTOMY N/A 06/29/2015   Procedure: LAPAROSCOPIC CHOLECYSTECTOMY;  Surgeon: Stark Klein, MD;  Location: [facility];  Service: General;  Laterality: N/A;  . CHROMOPERTUBATION Right 10/17/2017   Procedure: CHROMOPERTUBATION;  Surgeon: Ward, Honor Loh, MD;  Location: [facility];  Service: Gynecology;  Laterality: Right;  . [place] OF UTERUS  2016  . DILATION AND EVACUATION N/A 11/10/2014   Procedure: DILATATION AND EVACUATION;  Surgeon: Honor Loh Ward, MD;  Location: [facility];  Service: Gynecology;  Laterality: N/A;  . FLEXIBLE BRONCHOSCOPY N/A 02/18/2018   Procedure: FLEXIBLE BRONCHOSCOPY;  Surgeon: Ottie Glazier, MD;  Location: [facility];  Service: Thoracic;  Laterality: N/A;  . FOOT SURGERY Left 01/2012  .  FOOT SURGERY Left 2013   Plantar Fasiciatis  . LAPAROSCOPIC ENDOMETRIOSIS FULGURATION  2006  . LAPAROSCOPIC UNILATERAL SALPINGO OOPHERECTOMY Left 10/17/2017   Procedure: LAPAROSCOPIC UNILATERAL SALPINGO OOPHORECTOMY;   Surgeon: Ward, Honor Loh, MD;  Location: [facility];  Service: Gynecology;  Laterality: Left;  . LAPAROSCOPY N/A 10/17/2017   Procedure: LAPAROSCOPY OPERATIVE, Raeanne Gathers;  Surgeon: Ward, Honor Loh, MD;  Location: [facility];  Service: Gynecology;  Laterality: N/A;  . oophotectomy Left 09/2017   2 cysts on the same side also removed  . UPPER GI ENDOSCOPY      Family History  Problem Relation Age of Onset  . Ovarian cancer Mother 85  . Rheumatologic disease Maternal Aunt   . Pancreatic cancer Maternal Grandfather   . Colon cancer Paternal Grandmother   . Breast cancer Neg Hx     Social History   Socioeconomic History  . Marital status: Married    Spouse name: [name]  . Number of children: Not on file  . Years of education: Not on file  . Highest education level: Not on file  Occupational History  . Not on file  Social Needs  . Financial resource strain: Not on file  . Food insecurity    Worry: Not on file    Inability: Not on file  . Transportation needs    Medical: Not on file    Non-medical: Not on file  Tobacco Use  . Smoking status: Never Smoker  . Smokeless tobacco: Never Used  Substance and Sexual Activity  . Alcohol use: No  . Drug use: No  . Sexual activity: Not Currently  Lifestyle  . Physical activity    Days per week: Not on file    Minutes per session: Not on file  . Stress: Not on file  Relationships  . Social Herbalist on phone: Not on file    Gets together: Not on file    Attends religious service: Not on file    Active member of club or organization: Not on file    Attends meetings of clubs or organizations: Not on file    Relationship status: Not on file  . Intimate partner violence    Fear of current or ex partner: Not on file    Emotionally abused: Not on file    Physically abused: Not on file    Forced sexual activity: Not on file  Other Topics Concern  . Not on file  Social History Narrative  . Not on file    Current  Outpatient Medications on File Prior to Visit  Medication Sig Dispense Refill  . acetaminophen (TYLENOL) 500 MG tablet Take 1,000 mg by mouth every 8 (eight) hours as needed (pain).    Marland Kitchen apixaban (ELIQUIS) 2.5 MG TABS tablet Take 1 tablet (2.5 mg total) by mouth 2 (two) times daily. 60 tablet 3  . cetirizine (ZYRTEC) 10 MG tablet Take 10 mg by mouth daily.    . Cholecalciferol (VITAMIN D3) 1000 units CAPS Take 1,000 Units by mouth daily.    Marland Kitchen escitalopram (LEXAPRO) 5 MG tablet Take 15 mg by mouth every evening.     . folic acid (FOLVITE) 1 MG tablet Take 1 mg by mouth daily.  11  . Galcanezumab-gnlm 120 MG/ML SOAJ Inject 120 mg into the skin every 28 (twenty-eight) days.    Marland Kitchen LORazepam (ATIVAN) 0.5 MG tablet Take 0.5 mg by mouth 2 (two) times daily as needed for anxiety.     . mycophenolate (  CELLCEPT) 500 MG tablet Take 1,500 mg by mouth 2 (two) times a day.    Marland Kitchen omeprazole (PRILOSEC) 20 MG capsule Take 20 mg by mouth daily.     . predniSONE (DELTASONE) 10 MG tablet Take 10 mg by mouth daily. 5 mg QOD    . sulfamethoxazole-trimethoprim (BACTRIM DS,SEPTRA DS) 800-160 MG tablet Take 1 tablet by mouth every Monday, Wednesday and Friday    . sulfaSALAzine (AZULFIDINE) 500 MG EC tablet Take 500 mg by mouth 2 (two) times daily.  0  . SUMAtriptan (IMITREX) 50 MG tablet Take 50 mg by mouth every 2 (two) hours as needed for migraine.     . topiramate (TOPAMAX) 25 MG tablet Take 50 mg by mouth daily.     Marland Kitchen triamcinolone (NASACORT ALLERGY 24HR) 55 MCG/ACT AERO nasal inhaler Place 2 sprays into the nose daily.    . vitamin B-12 (CYANOCOBALAMIN) 1000 MCG tablet Take 1,000 mcg by mouth daily.     No current facility-administered medications on file prior to visit.     Allergies  Allergen Reactions  . Other Other (See Comments)    Gluten causes inflammation in GI system.  . Gluten Meal Diarrhea and Nausea And Vomiting       Observations/Objective: There were no vitals filed for this visit. There is  no height or weight on file to calculate BMI.  Physical Exam  Constitutional: She is oriented to person, place, and time. No distress.  HENT:  Head: Atraumatic.  Pulmonary/Chest: [place] normal.  Neurological: She is alert and oriented to person, place, and time.    CBC    Component Value Date/Time   WBC 3.6 (L) 11/05/2018 0844   RBC 4.48 11/05/2018 0844   HGB 13.2 11/05/2018 0844   HGB 13.0 08/09/2011 1658   HCT 39.7 11/05/2018 0844   HCT 37.6 08/09/2011 1658   PLT 214 11/05/2018 0844   PLT 175 08/09/2011 1658   MCV 88.6 11/05/2018 0844   MCV 88 08/09/2011 1658   MCH 29.5 11/05/2018 0844   MCHC 33.2 11/05/2018 0844   RDW 13.2 11/05/2018 0844   RDW 12.6 08/09/2011 1658   LYMPHSABS 0.7 11/05/2018 0844   MONOABS 0.4 11/05/2018 0844   EOSABS 0.1 11/05/2018 0844   BASOSABS 0.0 11/05/2018 0844    CMP     Component Value Date/Time   NA 137 11/05/2018 0844   NA 142 08/09/2011 1658   K 3.8 11/05/2018 0844   K 3.7 08/09/2011 1658   CL 106 11/05/2018 0844   CL 108 (H) 08/09/2011 1658   CO2 22 11/05/2018 0844   CO2 24 08/09/2011 1658   GLUCOSE 99 11/05/2018 0844   GLUCOSE 93 08/09/2011 1658   BUN 14 11/05/2018 0844   BUN 10 08/09/2011 1658   CREATININE 1.00 11/05/2018 0844   CREATININE 0.98 08/09/2011 1658   CALCIUM 9.3 11/05/2018 0844   CALCIUM 8.6 08/09/2011 1658   PROT 7.3 11/05/2018 0844   PROT 7.8 04/08/2011 1714   ALBUMIN 4.4 11/05/2018 0844   ALBUMIN 4.1 04/08/2011 1714   AST 22 11/05/2018 0844   AST 22 04/08/2011 1714   ALT 17 11/05/2018 0844   ALT 21 04/08/2011 1714   ALKPHOS 38 11/05/2018 0844   ALKPHOS 37 (L) 04/08/2011 1714   BILITOT 0.8 11/05/2018 0844   BILITOT 0.6 04/08/2011 1714   GFRNONAA >60 11/05/2018 0844   GFRNONAA >60 08/09/2011 1658   GFRAA >60 11/05/2018 0844   GFRAA >60 08/09/2011 1658     Assessment  and Plan: 1. Leg swelling   2. Heterozygous factor V Leiden mutation ([place])   3. History of pulmonary embolism   4. NSIP (nonspecific  interstitial pneumonitis) ([place])   5. Chronic anticoagulation    Acute onset of bilateral lower extremity swelling, differential includes DVT versus vein insufficiency/fluid retention. Given her history of factor V Leiden mutation carrier, history of unprovoked PE, maintenance dose of Eliquis 2.5 mg twice daily, I will obtain stat bilateral lower extremity ultrasound to rule out a new development of lower extremity DVT If negative, her leg swelling can be secondary to immobilization, vein insufficiency.  Advised minute break, change positions, elevation of legs.  Sore throat, cough, mild shortness of breath. Discussed with patient that the symptoms can be secondary to upper respiratory infection.  During the call with pandemic, recommend patient to discuss with primary care physician for need of screening for COVID-19.  Patient does not have any recent exposure to COVID-19.  However she is immunocompromised due to rheumatoid arthritis and being on immunosuppressant medications. She voices understanding we will discuss with Dr. Caryl Comes.   Follow Up Instructions: Keep previously scheduled follow-up appointments.   I discussed the assessment and treatment plan with the patient. The patient was provided an opportunity to ask questions and all were answered. The patient agreed with the plan and demonstrated an understanding of the instructions.  The patient was advised to call back or seek an in-person evaluation if the symptoms worsen or if the condition fails to improve as anticipated.   I provided 15 minutes of face-to-face video visit time during this encounter, and > 50% was spent counseling as documented under my assessment & plan.  Earlie Server, MD 11/17/2018 11:11 AM

## 2018-11-17 NOTE — Progress Notes (Signed)
Called patient for Telehealth visit via [place].  Patient c/o bilateral edema in feet and ankles started approx 1 week ago.  Patient also c/o SOB, nausea, sore throat, headache and mild cough.

## 2018-11-18 LAB — NOVEL CORONAVIRUS, NAA: SARS-CoV-2, NAA: NOT DETECTED

## 2018-11-29 ENCOUNTER — Other Ambulatory Visit: Payer: Self-pay | Admitting: Oncology

## 2019-01-19 ENCOUNTER — Ambulatory Visit (INDEPENDENT_AMBULATORY_CARE_PROVIDER_SITE_OTHER): Payer: BC Managed Care – PPO | Admitting: Vascular Surgery

## 2019-01-19 ENCOUNTER — Other Ambulatory Visit: Payer: Self-pay

## 2019-01-19 ENCOUNTER — Encounter (INDEPENDENT_AMBULATORY_CARE_PROVIDER_SITE_OTHER): Payer: Self-pay | Admitting: Vascular Surgery

## 2019-01-19 VITALS — BP 120/70 | HR 77 | Resp 16 | Wt 255.8 lb

## 2019-01-19 DIAGNOSIS — I2699 Other pulmonary embolism without acute cor pulmonale: Secondary | ICD-10-CM | POA: Diagnosis not present

## 2019-01-19 DIAGNOSIS — D6851 Activated protein C resistance: Secondary | ICD-10-CM | POA: Diagnosis not present

## 2019-01-19 DIAGNOSIS — M7989 Other specified soft tissue disorders: Secondary | ICD-10-CM | POA: Diagnosis not present

## 2019-01-19 DIAGNOSIS — N83202 Unspecified ovarian cyst, left side: Secondary | ICD-10-CM | POA: Insufficient documentation

## 2019-01-19 NOTE — Assessment & Plan Note (Signed)
The patient is already in instituted appropriate conservative therapy under the guidance of her primary care physician including 20 to 30 mmHg compression stockings daily, leg elevation, and increasing her activity.  Despite this, her swelling persists.  Given her previous history of pulmonary embolus which likely came from an undiagnosed DVT in the leg, postphlebitic symptoms may be playing a role in this.  She has likely also developed lymphedema from chronic scarring of the lymphatic channels.  One would suspect her DVT was in the right leg as this is the more severely affected the 2 legs, but that is not entirely clear.  I would like to perform a venous reflux study to evaluate for significant venous reflux in both the deep and superficial venous system.  If she has significant superficial venous reflux, a laser ablation procedure may be of benefit.  If she does not have significant superficial reflux and has only deep venous reflux or no reflux, lymphedema may be the primary diagnosis and she would likely benefit from a lymphedema pump in addition to the appropriate conservative therapies.  I have discussed this scenario in detail with the patient who voices their understanding and is agreeable with our plan of care.

## 2019-01-19 NOTE — Progress Notes (Signed)
Patient ID: DORLEEN KISSEL, female   DOB: 10/22/1980, 38 y.o.   MRN: [id number]  Chief Complaint  Patient presents with  . New Patient (Initial Visit)    ref Caryl Comes for edema    HPI KATLYN MULDREW is a 38 y.o. female.  I am asked to see the patient by Dr. Caryl Comes for evaluation of leg swelling.  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  She did this in the spring without significant swelling, but this fall her swelling has gotten quite severe.  The right leg is the more severely affected the 2 legs but the left leg is swelling some as well.  She has already been appropriately started on 20 to 30 mmHg compression stockings by her primary care physician without significant improvement.  She has also been instituting increased leg elevation and trying to increase her activity without significant improvement.  No ulceration or infection.  No fever or chills.  She does have a previous history of pulmonary embolus.  At the time of her PE, there was no residual DVT in the leg so they are not sure which leg this could have come from, but told her it did probably come from her legs.  She remains on anticoagulation for factor V Leiden.     Past Medical History:  Diagnosis Date  . Anxiety   . Arthritis   . Cancer ([place])    skin  . Collagen vascular disease ([place])   . Depression   . Diverticulitis   . Factor 5 Leiden mutation, heterozygous ([place]) 2018  . GERD (gastroesophageal reflux disease)   . Migraines   . Preterm labor   . Pulmonary embolism ([place])   . Vaginal Pap smear, abnormal     Past Surgical History:  Procedure Laterality Date  . CHOLECYSTECTOMY N/A 06/29/2015   Procedure: LAPAROSCOPIC CHOLECYSTECTOMY;  Surgeon: Stark Klein, MD;  [facility];  Service: General;  Laterality: N/A;  . CHROMOPERTUBATION Right 10/17/2017   Procedure: CHROMOPERTUBATION;  Surgeon: Ward, Honor Loh, MD;  Location: [facility];  Service:  Gynecology;  Laterality: Right;  . [place]ian OF UTERUS  2016  . DILATION AND EVACUATION N/A 11/10/2014   Procedure: DILATATION AND EVACUATION;  Surgeon: Honor Loh Ward, MD;  Location: [facility];  Service: Gynecology;  Laterality: N/A;  . FLEXIBLE BRONCHOSCOPY N/A 02/18/2018   Procedure: FLEXIBLE BRONCHOSCOPY;  Surgeon: Ottie Glazier, MD;  Location: [facility];  Service: Thoracic;  Laterality: N/A;  . FOOT SURGERY Left 01/2012  . FOOT SURGERY Left 2013   Plantar Fasiciatis  . LAPAROSCOPIC ENDOMETRIOSIS FULGURATION  2006  . LAPAROSCOPIC UNILATERAL SALPINGO OOPHERECTOMY Left 10/17/2017   Procedure: LAPAROSCOPIC UNILATERAL SALPINGO OOPHORECTOMY;  Surgeon: Ward, Honor Loh, MD;  Location: [facility];  Service: Gynecology;  Laterality: Left;  . LAPAROSCOPY N/A 10/17/2017   Procedure: LAPAROSCOPY OPERATIVE, Raeanne Gathers;  Surgeon: Ward, Honor Loh, MD;  Location: [facility];  Service: Gynecology;  Laterality: N/A;  . oophotectomy Left 09/2017   2 cysts on the same side also removed  . UPPER GI ENDOSCOPY      Family History Family History  Problem Relation Age of Onset  . Ovarian cancer Mother 36  . Rheumatologic disease Maternal Aunt   . Pancreatic cancer Maternal Grandfather   . Colon cancer Paternal Grandmother   . Breast cancer Neg Hx     Social History Social History   Tobacco Use  .  Smoking status: Never Smoker  . Smokeless tobacco: Never Used  Substance Use Topics  . Alcohol use: No  . Drug use: No    Allergies  Allergen Reactions  . Other Other (See Comments)    Gluten causes inflammation in GI system.  . Gluten Meal Diarrhea and Nausea And Vomiting    Current Outpatient Medications  Medication Sig Dispense Refill  . acetaminophen (TYLENOL) 500 MG tablet Take 1,000 mg by mouth every 8 (eight) hours as needed (pain).    . cetirizine (ZYRTEC) 10 MG tablet Take 10 mg by mouth daily.    . Cholecalciferol (VITAMIN D3) 1000 units CAPS Take 1,000 Units by mouth  daily.    Marland Kitchen ELIQUIS 2.5 MG TABS tablet TAKE 1 TABLET BY MOUTH TWICE A DAY 60 tablet 3  . escitalopram (LEXAPRO) 5 MG tablet Take 15 mg by mouth every evening.     . folic acid (FOLVITE) 1 MG tablet Take 1 mg by mouth daily.  11  . Galcanezumab-gnlm 120 MG/ML SOAJ Inject 120 mg into the skin every 28 (twenty-eight) days.    Marland Kitchen LORazepam (ATIVAN) 0.5 MG tablet Take 0.5 mg by mouth 2 (two) times daily as needed for anxiety.     . mycophenolate (CELLCEPT) 500 MG tablet Take 1,500 mg by mouth 2 (two) times a day.    Marland Kitchen omeprazole (PRILOSEC) 20 MG capsule Take 20 mg by mouth daily.     Marland Kitchen sulfaSALAzine (AZULFIDINE) 500 MG EC tablet Take 500 mg by mouth 2 (two) times daily.  0  . SUMAtriptan (IMITREX) 50 MG tablet Take 50 mg by mouth every 2 (two) hours as needed for migraine.     . topiramate (TOPAMAX) 25 MG tablet Take 50 mg by mouth daily.     Marland Kitchen triamcinolone (NASACORT ALLERGY 24HR) 55 MCG/ACT AERO nasal inhaler Place 2 sprays into the nose daily.    . vitamin B-12 (CYANOCOBALAMIN) 1000 MCG tablet Take 1,000 mcg by mouth daily.    . predniSONE (DELTASONE) 10 MG tablet Take 10 mg by mouth daily. 5 mg QOD    . sulfamethoxazole-trimethoprim (BACTRIM DS,SEPTRA DS) 800-160 MG tablet Take 1 tablet by mouth every Monday, Wednesday and Friday     No current facility-administered medications for this visit.       REVIEW OF SYSTEMS (Negative unless checked)  Constitutional: _0 Weight loss  _1 Fever  _2 Chills Cardiac: _3 Chest pain   _4 Chest pressure   _5 Palpitations   _6 Shortness of breath when laying flat   _7 Shortness of breath at rest   _8 Shortness of breath with exertion. Vascular:  _9 Pain in legs with walking   _10 Pain in legs at rest   _11 Pain in legs when laying flat   _12 Claudication   _13 Pain in feet when walking  _14 Pain in feet at rest  _15 Pain in feet when laying flat   _16 History of DVT   _17 Phlebitis   _18 Swelling in legs   _19 Varicose veins   _20 Non-healing ulcers Pulmonary:   _21 Uses home oxygen    _22 Productive cough   _23 Hemoptysis   _24 Wheeze  _25 COPD   _26 Asthma Neurologic:  _27 Dizziness  _28 Blackouts   _29 Seizures   _30 History of stroke   _31 History of TIA  _32 Aphasia   _33 Temporary blindness   _34 Dysphagia   _35 Weakness or numbness in arms   _36 Weakness or numbness in legs Musculoskeletal:  _37 Arthritis   _38 Joint swelling   _39 Joint pain   _40 Low back pain Hematologic:  _41 Easy bruising  _42 Easy bleeding   _43 Hypercoagulable state   _44 Anemic  _45   Hepatitis Gastrointestinal:  _0 Blood in stool   _1 Vomiting blood  _2 Gastroesophageal reflux/heartburn   _3 Abdominal pain Genitourinary:  _4 Chronic kidney disease   _5 Difficult urination  _6 Frequent urination  _7 Burning with urination   _8 Hematuria Skin:  _9 Rashes   _10 Ulcers   _11 Wounds Psychological:  _12 History of anxiety   _13  History of major depression.    Physical Exam BP 120/70 (BP Location: Right Arm)   Pulse 77   Resp 16   Wt 255 lb 12.8 oz (116 kg)   BMI 40.06 kg/m  Gen:  WD/WN, NAD Head: [place]/AT, No temporalis wasting.  Ear/Nose/Throat: Hearing grossly intact, nares w/o erythema or drainage, oropharynx w/o Erythema/Exudate Eyes: Conjunctiva clear, sclera non-icteric  Neck: trachea midline.  No JVD.  Pulmonary:  Good air movement, respirations not labored, no use of accessory muscles  Cardiac: RRR, no JVD Vascular:  Vessel Right Left  Radial Palpable Palpable                          DP 1+ 2+  PT 1+ 1+   Gastrointestinal:. No masses, surgical incisions, or scars. Musculoskeletal: M/S 5/5 throughout.  Extremities without ischemic changes.  No deformity or atrophy.  1-2+ right lower extremity edema, 1+ left lower extremity edema. Neurologic: Sensation grossly intact in extremities.  Symmetrical.  Speech is fluent. Motor exam as listed above. Psychiatric: Judgment intact, Mood & affect appropriate for pt's clinical situation. Dermatologic: No rashes or ulcers noted.  No cellulitis or open wounds.    Radiology No results found.  Labs  Recent Results (from the past 2160 hour(s))  CBC with Differential/Platelet     Status: Abnormal   Collection Time: 11/05/18  8:44 AM  Result Value Ref Range   WBC 3.6 (L) 4.0 - 10.5 K/uL   RBC 4.48 3.87 - 5.11 MIL/uL   Hemoglobin 13.2 12.0 - 15.0 g/dL   HCT 39.7 36.0 - 46.0 %   MCV 88.6 80.0 - 100.0 fL   MCH 29.5 26.0 - 34.0 pg   MCHC 33.2 30.0 - 36.0 g/dL   RDW 13.2 11.5 - 15.5 %   Platelets 214 150 - 400 K/uL   nRBC 0.0 0.0 - 0.2 %   Neutrophils Relative % 65 %   Neutro Abs 2.3 1.7 - 7.7 K/uL   Lymphocytes Relative 20 %   Lymphs Abs 0.7 0.7 - 4.0 K/uL   Monocytes Relative 12 %   Monocytes Absolute 0.4 0.1 - 1.0 K/uL   Eosinophils Relative 2 %   Eosinophils Absolute 0.1 0.0 - 0.5 K/uL   Basophils Relative 1 %   Basophils Absolute 0.0 0.0 - 0.1 K/uL   Immature Granulocytes 0 %   Abs Immature Granulocytes 0.00 0.00 - 0.07 K/uL    Comment: Performed at [facility], [address] [place], [place] [address]  Comprehensive metabolic panel     Status: None   Collection Time: 11/05/18  8:44 AM  Result Value Ref Range   Sodium 137 135 - 145 mmol/L   Potassium 3.8 3.5 - 5.1 mmol/L   Chloride 106 98 - 111 mmol/L   CO2 22 22 - 32 mmol/L   Glucose, Bld 99 70 - 99 mg/dL   BUN 14 6 - 20 mg/dL   Creatinine, Ser 1.00 0.44 - 1.00 mg/dL   Calcium 9.3 8.9 - 10.3 mg/dL   Total Protein 7.3 6.5 - 8.1 g/dL   Albumin 4.4 3.5 - 5.0 g/dL   AST 22 15 -  41 U/L   ALT 17 0 - 44 U/L   Alkaline Phosphatase 38 38 - 126 U/L   Total Bilirubin 0.8 0.3 - 1.2 mg/dL   GFR calc non Af Amer >60 >60 mL/min   GFR calc Af Amer >60 >60 mL/min   Anion gap 9 5 - 15    Comment: Performed at [facility], [address], [place], [place] [address]  Novel Coronavirus, NAA ([facility])     Status: None   Collection Time: 11/17/18 12:00 AM   Specimen: Oropharyngeal(OP) collection in vial transport medium   OROPHARYNGEA  TESTING  Result Value Ref Range   SARS-CoV-2, NAA Not Detected Not Detected     Comment: Testing was performed using the cobas(R) SARS-CoV-2 test. This test was developed and its performance characteristics determined by [employer]. This test has not been FDA cleared or approved. This test has been authorized by FDA under an Emergency Use Authorization (EUA). This test is only authorized for the duration of time the declaration that circumstances exist justifying the authorization of the emergency use of in vitro diagnostic tests for detection of SARS-CoV-2 virus and/or diagnosis of COVID-19 infection under section 564(b)(1) of the Act, 21 U.S.C. 283TDV-7(O)(1), unless the authorization is terminated or revoked sooner. When diagnostic testing is negative, the possibility of a false negative result should be considered in the context of a patient's recent exposures and the presence of clinical signs and symptoms consistent with COVID-19. An individual without symptoms of COVID-19 and who is not shedding SARS-CoV-2 virus would expect to have a negati ve (not detected) result in this assay.     Assessment/Plan:  Pulmonary embolus (HCC) Remains on anticoagulation after diagnosis of factor V Leiden  Heterozygous factor V Leiden mutation (HCC) On anticoagulation  Swelling of limb The patient is already in instituted appropriate conservative therapy under the guidance of her primary care physician including 20 to 30 mmHg compression stockings daily, leg elevation, and increasing her activity.  Despite this, her swelling persists.  Given her previous history of pulmonary embolus which likely came from an undiagnosed DVT in the leg, postphlebitic symptoms may be playing a role in this.  She has likely also developed lymphedema from chronic scarring of the lymphatic channels.  One would suspect her DVT was in the right leg as this is the more severely affected the 2 legs, but that is not entirely clear.  I would like to perform a venous reflux study to evaluate for  significant venous reflux in both the deep and superficial venous system.  If she has significant superficial venous reflux, a laser ablation procedure may be of benefit.  If she does not have significant superficial reflux and has only deep venous reflux or no reflux, lymphedema may be the primary diagnosis and she would likely benefit from a lymphedema pump in addition to the appropriate conservative therapies.  I have discussed this scenario in detail with the patient who voices their understanding and is agreeable with our plan of care.      Leotis Pain 01/19/2019, 3:44 PM   This note was created with Dragon medical transcription system.  Any errors from dictation are unintentional.

## 2019-01-19 NOTE — Assessment & Plan Note (Signed)
Remains on anticoagulation after diagnosis of factor V Leiden

## 2019-01-19 NOTE — Patient Instructions (Signed)

## 2019-01-19 NOTE — Assessment & Plan Note (Signed)
On anticoagulation 

## 2019-01-22 ENCOUNTER — Ambulatory Visit (INDEPENDENT_AMBULATORY_CARE_PROVIDER_SITE_OTHER): Payer: BC Managed Care – PPO

## 2019-01-22 ENCOUNTER — Ambulatory Visit (INDEPENDENT_AMBULATORY_CARE_PROVIDER_SITE_OTHER): Payer: BC Managed Care – PPO | Admitting: Nurse Practitioner

## 2019-01-22 ENCOUNTER — Other Ambulatory Visit: Payer: Self-pay

## 2019-01-22 ENCOUNTER — Encounter (INDEPENDENT_AMBULATORY_CARE_PROVIDER_SITE_OTHER): Payer: Self-pay | Admitting: Nurse Practitioner

## 2019-01-22 VITALS — BP 137/84 | HR 76 | Resp 16 | Wt 258.6 lb

## 2019-01-22 DIAGNOSIS — M7989 Other specified soft tissue disorders: Secondary | ICD-10-CM

## 2019-01-22 DIAGNOSIS — I8311 Varicose veins of right lower extremity with inflammation: Secondary | ICD-10-CM | POA: Diagnosis not present

## 2019-01-22 DIAGNOSIS — I890 Lymphedema, not elsewhere classified: Secondary | ICD-10-CM | POA: Diagnosis not present

## 2019-01-22 DIAGNOSIS — D6851 Activated protein C resistance: Secondary | ICD-10-CM

## 2019-01-24 ENCOUNTER — Encounter (INDEPENDENT_AMBULATORY_CARE_PROVIDER_SITE_OTHER): Payer: Self-pay | Admitting: Nurse Practitioner

## 2019-01-24 DIAGNOSIS — I890 Lymphedema, not elsewhere classified: Secondary | ICD-10-CM | POA: Insufficient documentation

## 2019-01-24 DIAGNOSIS — I8311 Varicose veins of right lower extremity with inflammation: Secondary | ICD-10-CM | POA: Insufficient documentation

## 2019-01-24 NOTE — Progress Notes (Signed)
SUBJECTIVE:  Patient ID: Pamela Mills, female    DOB: Mar 04, 1981, 38 y.o.   MRN: [id number] Chief Complaint  Patient presents with  . Follow-up    ultrasound follow up    HPI  Pamela Mills is a 38 y.o. female that presents today for lower extremity edema, the right being worse than the left.  The patient has previous history of heterozygous factor V Leiden disorder.  She is currently on Eliquis without issue.  The patient does have a previous history of DVT as well as PE however she is unable to remember which leg.  The patient currently works as a Pharmacist, hospital and noticed that her swelling of her lower extremities was greatly worse after she noticed that she began to sit more doing remote learning.  The patient saw her primary care physician around 12/18/2018 and it was recommended that she utilize medical grade 1 compression stockings to assist with the swelling.  The patient has been utilizing compression socks with strength 20 to 30 mmHg, elevating as much as possible as well as trying to exercise 30 minutes a day.  She does note that the swelling and discomfort has improved somewhat but it is not completely resolved.  In addition to the swelling the patient also endorses having pain more so on her right lower extremity.  She describes this as an aching throbbing pain.  The pain is worse near the end of the day and better in the morning.  It has not gotten to the point where it is affecting her ability to do everyday activities such as grocery shopping or cleaning her home.  Today the patient underwent noninvasive studies.  Today the patient had reflux in the right great saphenous vein distal thigh.  There was no evidence of DVT or superficial venous thrombosis bilaterally.  No evidence of chronic venous insufficiency within the deep system.  No evidence of reflux in the left lower extremity.  Past Medical History:  Diagnosis Date  . Anxiety   . Arthritis   . Cancer ([place])    skin  . Collagen  vascular disease ([place])   . Depression   . Diverticulitis   . Factor 5 Leiden mutation, heterozygous ([place]) 2018  . GERD (gastroesophageal reflux disease)   . Migraines   . Preterm labor   . Pulmonary embolism ([place])   . Vaginal Pap smear, abnormal     Past Surgical History:  Procedure Laterality Date  . CHOLECYSTECTOMY N/A 06/29/2015   Procedure: LAPAROSCOPIC CHOLECYSTECTOMY;  Surgeon: Stark Klein, MD;  Location: [facility];  Service: General;  Laterality: N/A;  . CHROMOPERTUBATION Right 10/17/2017   Procedure: CHROMOPERTUBATION;  Surgeon: Ward, Honor Loh, MD;  Location: [facility];  Service: Gynecology;  Laterality: Right;  . [place] OF UTERUS  2016  . DILATION AND EVACUATION N/A 11/10/2014   Procedure: DILATATION AND EVACUATION;  Surgeon: Honor Loh Ward, MD;  Location: [facility];  Service: Gynecology;  Laterality: N/A;  . FLEXIBLE BRONCHOSCOPY N/A 02/18/2018   Procedure: FLEXIBLE BRONCHOSCOPY;  Surgeon: Ottie Glazier, MD;  Location: [facility];  Service: Thoracic;  Laterality: N/A;  . FOOT SURGERY Left 01/2012  . FOOT SURGERY Left 2013   Plantar Fasiciatis  . LAPAROSCOPIC ENDOMETRIOSIS FULGURATION  2006  . LAPAROSCOPIC UNILATERAL SALPINGO OOPHERECTOMY Left 10/17/2017   Procedure: LAPAROSCOPIC UNILATERAL SALPINGO OOPHORECTOMY;  Surgeon: Ward, Honor Loh, MD;  Location: [facility];  Service: Gynecology;  Laterality: Left;  . LAPAROSCOPY N/A 10/17/2017   Procedure: LAPAROSCOPY  OPERATIVE, PERITOEAL STRIPPING;  Surgeon: Ward, Honor Loh, MD;  Location: [facility];  Service: Gynecology;  Laterality: N/A;  . oophotectomy Left 09/2017   2 cysts on the same side also removed  . UPPER GI ENDOSCOPY      Social History   Socioeconomic History  . Marital status: Married    Spouse name: [name]  . Number of children: Not on file  . Years of education: Not on file  . Highest education level: Not on file  Occupational History  . Not on file  Social Needs  . Financial  resource strain: Not on file  . Food insecurity    Worry: Not on file    Inability: Not on file  . Transportation needs    Medical: Not on file    Non-medical: Not on file  Tobacco Use  . Smoking status: Never Smoker  . Smokeless tobacco: Never Used  Substance and Sexual Activity  . Alcohol use: No  . Drug use: No  . Sexual activity: Not Currently  Lifestyle  . Physical activity    Days per week: Not on file    Minutes per session: Not on file  . Stress: Not on file  Relationships  . Social Herbalist on phone: Not on file    Gets together: Not on file    Attends religious service: Not on file    Active member of club or organization: Not on file    Attends meetings of clubs or organizations: Not on file    Relationship status: Not on file  . Intimate partner violence    Fear of current or ex partner: Not on file    Emotionally abused: Not on file    Physically abused: Not on file    Forced sexual activity: Not on file  Other Topics Concern  . Not on file  Social History Narrative  . Not on file    Family History  Problem Relation Age of Onset  . Ovarian cancer Mother 53  . Rheumatologic disease Maternal Aunt   . Pancreatic cancer Maternal Grandfather   . Colon cancer Paternal Grandmother   . Breast cancer Neg Hx     Allergies  Allergen Reactions  . Other Other (See Comments)    Gluten causes inflammation in GI system.  . Gluten Meal Diarrhea and Nausea And Vomiting     Review of Systems   Review of Systems: Negative Unless Checked Constitutional: _0 Weight loss  _1 Fever  _2 Chills Cardiac: _3 Chest pain   _4  Atrial Fibrillation  _5 Palpitations   _6 Shortness of breath when laying flat   _7 Shortness of breath with exertion. _8 Shortness of breath at rest Vascular:  _9 Pain in legs with walking   _10 Pain in legs with standing _11 Pain in legs when laying flat   _12 Claudication    _13 Pain in feet when laying flat    _14 History of DVT   _15 Phlebitis    _16 Swelling in legs   _17 Varicose veins   _18 Non-healing ulcers Pulmonary:   _19 Uses home oxygen   _20 Productive cough   _21 Hemoptysis   _22 Wheeze  _23 COPD   _24 Asthma Neurologic:  _25 Dizziness   _26 Seizures  _27 Blackouts _28 History of stroke   _29 History of TIA  _30 Aphasia   _31 Temporary Blindness   _32 Weakness or numbness in arm   _33 Weakness or numbness in leg Musculoskeletal:   _34 Joint swelling   _35 Joint pain   _36 Low back pain  _37  History of Knee Replacement _38 Arthritis _39 back Surgeries  _40  Spinal Stenosis  Hematologic:  _0 Easy bruising  _1 Easy bleeding   _2 Hypercoagulable state   _3 Anemic Gastrointestinal:  _4 Diarrhea   _5 Vomiting  _6 Gastroesophageal reflux/heartburn   _7 Difficulty swallowing. _8 Abdominal pain Genitourinary:  _9 Chronic kidney disease   _10 Difficult urination  _11 Anuric   _12 Blood in urine _13 Frequent urination  _14 Burning with urination   _15 Hematuria Skin:  _16 Rashes   _17 Ulcers _18 Wounds Psychological:  _19 History of anxiety   _20  History of major depression  _21  Memory Difficulties      OBJECTIVE:   Physical Exam  BP 137/84 (BP Location: Right Arm)   Pulse 76   Resp 16   Wt 258 lb 9.6 oz (117.3 kg)   BMI 40.50 kg/m   Gen: WD/WN, NAD Head: [place]/AT, No temporalis wasting.  Ear/Nose/Throat: Hearing grossly intact, nares w/o erythema or drainage Eyes: PER, EOMI, sclera nonicteric.  Neck: Supple, no masses.  No JVD.  Pulmonary:  Good air movement, no use of accessory muscles.  Cardiac: RRR Vascular:  Scattered varicosities bilaterally.  1+ soft edema left lower extremity, 3+ right lower extremity.  Bilateral stasis dermatitis Vessel Right Left  Radial Palpable Palpable  Dorsalis Pedis Palpable Palpable  Posterior Tibial Palpable Palpable   Gastrointestinal: soft, non-distended. No guarding/no peritoneal signs.  Musculoskeletal: M/S 5/5 throughout.  No deformity or atrophy.  Neurologic: Pain and light touch intact in extremities.  Symmetrical.  Speech is fluent. Motor exam as listed  above. Psychiatric: Judgment intact, Mood & affect appropriate for pt's clinical situation. Dermatologic: No Venous rashes. No Ulcers Noted.  No changes consistent with cellulitis. Lymph : No Cervical lymphadenopathy, dermal thickening bilaterally       ASSESSMENT AND PLAN:  1. Lymphedema Recommend:  No surgery or intervention at this point in time.    I have reviewed my previous discussion with the patient regarding swelling and why it causes symptoms.  Patient will continue wearing graduated compression stockings class 1 (20-30 mmHg) on a daily basis. The patient will  beginning wearing the stockings first thing in the morning and removing them in the evening. The patient is instructed specifically not to sleep in the stockings.    In addition, behavioral modification including several periods of elevation of the lower extremities during the day will be continued.  This was reviewed with the patient during the initial visit.  The patient will also continue routine exercise, especially walking on a daily basis as was discussed during the initial visit.    Despite conservative treatments including graduated compression therapy class 1 and behavioral modification including exercise and elevation the patient  has not obtained adequate control of the lymphedema.  The patient still has stage 3 lymphedema and therefore, I believe that a lymph pump should be added to improve the control of the patient's lymphedema.  Additionally, a lymph pump is warranted because it will reduce the risk of cellulitis and ulceration in the future.    2. Varicose veins of right lower extremity with inflammation  Recommend:  The patient has large symptomatic varicose veins that are painful and associated with swelling.  I have had a long discussion with the patient regarding  varicose veins and why they cause symptoms.  Patient will begin wearing graduated compression stockings class 1 on a daily basis, beginning  first thing in the morning and removing them in the evening. The patient is instructed specifically not to sleep in the stockings.    The patient  will also begin using over-the-counter analgesics such as Motrin 600 mg po TID to help control the  symptoms.    In addition, behavioral modification including elevation during the day will be initiated.    Pending the results of these changes the  patient will be reevaluated in 2 months.  Further plans will be based on the ultrasound results and whether conservative therapies are successful at eliminating the pain and swelling.   3. Heterozygous factor V Leiden mutation [facility]) Currently the patient is tolerating anticoagulation well.  There have been no further incidences of DVT or PE following previous incidence.   Current Outpatient Medications on File Prior to Visit  Medication Sig Dispense Refill  . acetaminophen (TYLENOL) 500 MG tablet Take 1,000 mg by mouth every 8 (eight) hours as needed (pain).    . cetirizine (ZYRTEC) 10 MG tablet Take 10 mg by mouth daily.    . Cholecalciferol (VITAMIN D3) 1000 units CAPS Take 1,000 Units by mouth daily.    Marland Kitchen ELIQUIS 2.5 MG TABS tablet TAKE 1 TABLET BY MOUTH TWICE A DAY 60 tablet 3  . escitalopram (LEXAPRO) 5 MG tablet Take 15 mg by mouth every evening.     . folic acid (FOLVITE) 1 MG tablet Take 1 mg by mouth daily.  11  . Galcanezumab-gnlm 120 MG/ML SOAJ Inject 120 mg into the skin every 28 (twenty-eight) days.    Marland Kitchen LORazepam (ATIVAN) 0.5 MG tablet Take 0.5 mg by mouth 2 (two) times daily as needed for anxiety.     . mycophenolate (CELLCEPT) 500 MG tablet Take 1,500 mg by mouth 2 (two) times a day.    Marland Kitchen omeprazole (PRILOSEC) 20 MG capsule Take 20 mg by mouth daily.     . predniSONE (DELTASONE) 10 MG tablet Take 10 mg by mouth daily. 5 mg QOD    . sulfamethoxazole-trimethoprim (BACTRIM DS,SEPTRA DS) 800-160 MG tablet Take 1 tablet by mouth every Monday, Wednesday and Friday    . sulfaSALAzine  (AZULFIDINE) 500 MG EC tablet Take 500 mg by mouth 2 (two) times daily.  0  . SUMAtriptan (IMITREX) 50 MG tablet Take 50 mg by mouth every 2 (two) hours as needed for migraine.     . topiramate (TOPAMAX) 25 MG tablet Take 50 mg by mouth daily.     Marland Kitchen triamcinolone (NASACORT ALLERGY 24HR) 55 MCG/ACT AERO nasal inhaler Place 2 sprays into the nose daily.    . vitamin B-12 (CYANOCOBALAMIN) 1000 MCG tablet Take 1,000 mcg by mouth daily.     No current facility-administered medications on file prior to visit.     There are no Patient Instructions on file for this visit. No follow-ups on file.   Kris Hartmann, NP  This note was completed with Sales executive.  Any errors are purely unintentional.

## 2019-03-30 ENCOUNTER — Ambulatory Visit (INDEPENDENT_AMBULATORY_CARE_PROVIDER_SITE_OTHER): Payer: BC Managed Care – PPO | Admitting: Vascular Surgery

## 2019-04-07 ENCOUNTER — Other Ambulatory Visit: Payer: Self-pay | Admitting: *Deleted

## 2019-04-07 MED ORDER — APIXABAN 2.5 MG PO TABS: 2.5000 mg | ORAL_TABLET | Freq: Two times a day (BID) | ORAL | 3 refills | Status: DC

## 2019-05-05 ENCOUNTER — Inpatient Hospital Stay: Payer: BC Managed Care – PPO | Attending: Oncology

## 2019-05-05 ENCOUNTER — Other Ambulatory Visit: Payer: Self-pay

## 2019-05-05 DIAGNOSIS — Z7901 Long term (current) use of anticoagulants: Secondary | ICD-10-CM | POA: Diagnosis not present

## 2019-05-05 DIAGNOSIS — Z86711 Personal history of pulmonary embolism: Secondary | ICD-10-CM | POA: Insufficient documentation

## 2019-05-05 DIAGNOSIS — M7989 Other specified soft tissue disorders: Secondary | ICD-10-CM | POA: Diagnosis present

## 2019-05-05 LAB — COMPREHENSIVE METABOLIC PANEL
ALT: 16 U/L (ref 0–44)
AST: 17 U/L (ref 15–41)
Albumin: 4.4 g/dL (ref 3.5–5.0)
Alkaline Phosphatase: 46 U/L (ref 38–126)
Anion gap: 7 (ref 5–15)
BUN: 13 mg/dL (ref 6–20)
CO2: 24 mmol/L (ref 22–32)
Calcium: 9 mg/dL (ref 8.9–10.3)
Chloride: 105 mmol/L (ref 98–111)
Creatinine, Ser: 1.07 mg/dL — ABNORMAL HIGH (ref 0.44–1.00)
GFR calc Af Amer: >60 mL/min (ref >60)
GFR calc non Af Amer: >60 mL/min (ref >60)
Glucose, Bld: 85 mg/dL (ref 70–99)
Potassium: 3.4 mmol/L — ABNORMAL LOW (ref 3.5–5.1)
Sodium: 136 mmol/L (ref 135–145)
Total Bilirubin: 0.5 mg/dL (ref 0.3–1.2)
Total Protein: 7 g/dL (ref 6.5–8.1)

## 2019-05-05 LAB — CBC WITH DIFFERENTIAL/PLATELET
Abs Immature Granulocytes: 0.02 10*3/uL (ref 0.00–0.07)
Basophils Absolute: 0 10*3/uL (ref 0.0–0.1)
Basophils Relative: 1 %
Eosinophils Absolute: 0.1 10*3/uL (ref 0.0–0.5)
Eosinophils Relative: 1 %
HCT: 40 % (ref 36.0–46.0)
Hemoglobin: 12.6 g/dL (ref 12.0–15.0)
Immature Granulocytes: 1 %
Lymphocytes Relative: 19 %
Lymphs Abs: 0.8 10*3/uL (ref 0.7–4.0)
MCH: 28.2 pg (ref 26.0–34.0)
MCHC: 31.5 g/dL (ref 30.0–36.0)
MCV: 89.5 fL (ref 80.0–100.0)
Monocytes Absolute: 0.5 10*3/uL (ref 0.1–1.0)
Monocytes Relative: 12 %
Neutro Abs: 2.9 10*3/uL (ref 1.7–7.7)
Neutrophils Relative %: 66 %
Platelets: 189 10*3/uL (ref 150–400)
RBC: 4.47 MIL/uL (ref 3.87–5.11)
RDW: 13.4 % (ref 11.5–15.5)
WBC: 4.4 10*3/uL (ref 4.0–10.5)
nRBC: 0 % (ref 0.0–0.2)

## 2019-05-06 ENCOUNTER — Inpatient Hospital Stay (HOSPITAL_BASED_OUTPATIENT_CLINIC_OR_DEPARTMENT_OTHER): Payer: BC Managed Care – PPO | Admitting: Oncology

## 2019-05-06 ENCOUNTER — Other Ambulatory Visit: Payer: BC Managed Care – PPO

## 2019-05-06 ENCOUNTER — Encounter: Payer: Self-pay | Admitting: Oncology

## 2019-05-06 DIAGNOSIS — Z7901 Long term (current) use of anticoagulants: Secondary | ICD-10-CM | POA: Diagnosis not present

## 2019-05-06 DIAGNOSIS — Z86711 Personal history of pulmonary embolism: Secondary | ICD-10-CM

## 2019-05-06 DIAGNOSIS — R7989 Other specified abnormal findings of blood chemistry: Secondary | ICD-10-CM | POA: Diagnosis not present

## 2019-05-06 DIAGNOSIS — D6851 Activated protein C resistance: Secondary | ICD-10-CM | POA: Diagnosis not present

## 2019-05-06 DIAGNOSIS — J8489 Other specified interstitial pulmonary diseases: Secondary | ICD-10-CM

## 2019-05-06 NOTE — Progress Notes (Signed)
Patient contacted for Mychart visit. In the last 3 weeks for about 1 or 2 days she has experienced muscle soreness to right front shin, similar to when she had a clot previously. Bruising to back to right calf.

## 2019-05-06 NOTE — Progress Notes (Signed)
HEMATOLOGY-ONCOLOGY TeleHEALTH VISIT PROGRESS NOTE  I connected with Pamela Mills on 05/06/19 at 10:30 AM EST by video enabled telemedicine visit and verified that I am speaking with the correct person using two identifiers. I discussed the limitations, risks, security and privacy concerns of performing an evaluation and management service by telemedicine and the availability of in-person appointments. I also discussed with the patient that there may be a patient responsible charge related to this service. The patient expressed understanding and agreed to proceed.   Other persons participating in the visit and their role in the encounter:  [employer]   Patient's location: Home  Provider's location:office Chief Complaint: Lower extremity swelling   INTERVAL HISTORY Pamela Mills is a 39 y.o. female who has above history reviewed by me today presents for follow up visit for management of lower extremity swelling. Problems and complaints are listed below:  During the interval patient establish care with vascular surgery for lower extremity edema, patient was diagnosed to have lower extremity varicose.  Patient was suggested to use compression stockings. Patient reports her symptoms has improved. Recently she noticed right lower extremity shin cramps, and she noticed some bruising.  No significant swelling. She cannot recall any recent trauma.  Patient takes Eliquis 2.5 mg twice daily as maintenance for history of PE She also is on CellCept and sulfasalazine.   respiratory symptoms are well controlled.  . Review of Systems  Constitutional: Negative for appetite change, chills, fatigue and fever.  HENT:   Negative for hearing loss, sore throat and voice change.   Eyes: Negative for eye problems.  Respiratory: Negative for chest tightness and cough.   Cardiovascular: Negative for chest pain and leg swelling.  Gastrointestinal: Negative for abdominal distention, abdominal pain and blood in stool.   Endocrine: Negative for hot flashes.  Genitourinary: Negative for difficulty urinating and frequency.   Musculoskeletal: Negative for arthralgias.       Right shin cramps  Skin: Negative for itching and rash.  Neurological: Negative for extremity weakness.  Hematological: Negative for adenopathy.  Psychiatric/Behavioral: Negative for confusion.    Past Medical History:  Diagnosis Date  . Anxiety   . Arthritis   . Cancer ([place])    skin  . Collagen vascular disease ([place])   . Depression   . Diverticulitis   . Factor 5 Leiden mutation, heterozygous ([place]) 2018  . GERD (gastroesophageal reflux disease)   . Migraines   . Preterm labor   . Pulmonary embolism ([place])   . Vaginal Pap smear, abnormal    Past Surgical History:  Procedure Laterality Date  . CHOLECYSTECTOMY N/A 06/29/2015   Procedure: LAPAROSCOPIC CHOLECYSTECTOMY;  Surgeon: Stark Klein, MD;  Location: [facility];  Service: General;  Laterality: N/A;  . CHROMOPERTUBATION Right 10/17/2017   Procedure: CHROMOPERTUBATION;  Surgeon: Ward, Honor Loh, MD;  Location: [facility];  Service: Gynecology;  Laterality: Right;  . [place] OF UTERUS  2016  . DILATION AND EVACUATION N/A 11/10/2014   Procedure: DILATATION AND EVACUATION;  Surgeon: Honor Loh Ward, MD;  Location: [facility];  Service: Gynecology;  Laterality: N/A;  . FLEXIBLE BRONCHOSCOPY N/A 02/18/2018   Procedure: FLEXIBLE BRONCHOSCOPY;  Surgeon: Ottie Glazier, MD;  Location: [facility];  Service: Thoracic;  Laterality: N/A;  . FOOT SURGERY Left 01/2012  . FOOT SURGERY Left 2013   Plantar Fasiciatis  . LAPAROSCOPIC ENDOMETRIOSIS FULGURATION  2006  . LAPAROSCOPIC UNILATERAL SALPINGO OOPHERECTOMY Left 10/17/2017   Procedure: LAPAROSCOPIC UNILATERAL SALPINGO OOPHORECTOMY;  Surgeon:  Ward, Honor Loh, MD;  Location: [facility];  Service: Gynecology;  Laterality: Left;  . LAPAROSCOPY N/A 10/17/2017   Procedure: LAPAROSCOPY OPERATIVE, Raeanne Gathers;   Surgeon: Ward, Honor Loh, MD;  Location: [facility];  Service: Gynecology;  Laterality: N/A;  . oophotectomy Left 09/2017   2 cysts on the same side also removed  . UPPER GI ENDOSCOPY      Family History  Problem Relation Age of Onset  . Ovarian cancer Mother 42  . Rheumatologic disease Maternal Aunt   . Pancreatic cancer Maternal Grandfather   . Colon cancer Paternal Grandmother   . Breast cancer Neg Hx     Social History   Socioeconomic History  . Marital status: Married    Spouse name: Pamela Mills  . Number of children: Not on file  . Years of education: Not on file  . Highest education level: Not on file  Occupational History  . Not on file  Tobacco Use  . Smoking status: Never Smoker  . Smokeless tobacco: Never Used  Substance and Sexual Activity  . Alcohol use: No  . Drug use: No  . Sexual activity: Not Currently  Other Topics Concern  . Not on file  Social History Narrative  . Not on file   Social Determinants of Health   Financial Resource Strain:   . Difficulty of Paying Living Expenses: Not on file  Food Insecurity:   . Worried About Charity fundraiser in the Last Year: Not on file  . Ran Out of Food in the Last Year: Not on file  Transportation Needs:   . Lack of Transportation (Medical): Not on file  . Lack of Transportation (Non-Medical): Not on file  Physical Activity:   . Days of Exercise per Week: Not on file  . Minutes of Exercise per Session: Not on file  Stress:   . Feeling of Stress : Not on file  Social Connections:   . Frequency of Communication with Friends and Family: Not on file  . Frequency of Social Gatherings with Friends and Family: Not on file  . Attends Religious Services: Not on file  . Active Member of Clubs or Organizations: Not on file  . Attends Archivist Meetings: Not on file  . Marital Status: Not on file  Intimate Partner Violence:   . Fear of Current or Ex-Partner: Not on file  . Emotionally Abused: Not on file  .  Physically Abused: Not on file  . Sexually Abused: Not on file    Current Outpatient Medications on File Prior to Visit  Medication Sig Dispense Refill  . acetaminophen (TYLENOL) 500 MG tablet Take 1,000 mg by mouth every 8 (eight) hours as needed (pain).    Marland Kitchen apixaban (ELIQUIS) 2.5 MG TABS tablet Take 1 tablet (2.5 mg total) by mouth 2 (two) times daily. 60 tablet 3  . cetirizine (ZYRTEC) 10 MG tablet Take 10 mg by mouth daily.    . Cholecalciferol (VITAMIN D3) 1000 units CAPS Take 1,000 Units by mouth daily.    Marland Kitchen escitalopram (LEXAPRO) 5 MG tablet Take 15 mg by mouth every evening.     . folic acid (FOLVITE) 1 MG tablet Take 1 mg by mouth daily.  11  . Galcanezumab-gnlm 120 MG/ML SOAJ Inject 120 mg into the skin every 28 (twenty-eight) days.    Marland Kitchen LORazepam (ATIVAN) 0.5 MG tablet Take 0.5 mg by mouth 2 (two) times daily as needed for anxiety.     . mycophenolate (CELLCEPT) 500  MG tablet Take 1,500 mg by mouth 2 (two) times a day.    Marland Kitchen omeprazole (PRILOSEC) 20 MG capsule Take 20 mg by mouth daily.     Marland Kitchen sulfaSALAzine (AZULFIDINE) 500 MG EC tablet Take 500 mg by mouth 2 (two) times daily.  0  . SUMAtriptan (IMITREX) 50 MG tablet Take 50 mg by mouth every 2 (two) hours as needed for migraine.     . topiramate (TOPAMAX) 25 MG tablet Take 50 mg by mouth daily.     Marland Kitchen triamcinolone (NASACORT ALLERGY 24HR) 55 MCG/ACT AERO nasal inhaler Place 2 sprays into the nose daily.    . vitamin B-12 (CYANOCOBALAMIN) 1000 MCG tablet Take 1,000 mcg by mouth daily.     No current facility-administered medications on file prior to visit.    Allergies  Allergen Reactions  . Other Other (See Comments)    Gluten causes inflammation in GI system.  . Gluten Meal Diarrhea and Nausea And Vomiting       Observations/Objective: There were no vitals filed for this visit. There is no height or weight on file to calculate BMI.  Physical Exam  Constitutional: She is oriented to person, place, and time. No distress.   HENT:  Head: Atraumatic.  Pulmonary/Chest: Effort normal.  Neurological: She is alert and oriented to person, place, and time.  Psychiatric: Mood normal.    CBC    Component Value Date/Time   WBC 4.4 05/05/2019 1504   RBC 4.47 05/05/2019 1504   HGB 12.6 05/05/2019 1504   HGB 13.0 08/09/2011 1658   HCT 40.0 05/05/2019 1504   HCT 37.6 08/09/2011 1658   PLT 189 05/05/2019 1504   PLT 175 08/09/2011 1658   MCV 89.5 05/05/2019 1504   MCV 88 08/09/2011 1658   MCH 28.2 05/05/2019 1504   MCHC 31.5 05/05/2019 1504   RDW 13.4 05/05/2019 1504   RDW 12.6 08/09/2011 1658   LYMPHSABS 0.8 05/05/2019 1504   MONOABS 0.5 05/05/2019 1504   EOSABS 0.1 05/05/2019 1504   BASOSABS 0.0 05/05/2019 1504    CMP     Component Value Date/Time   NA 136 05/05/2019 1504   NA 142 08/09/2011 1658   K 3.4 (L) 05/05/2019 1504   K 3.7 08/09/2011 1658   CL 105 05/05/2019 1504   CL 108 (H) 08/09/2011 1658   CO2 24 05/05/2019 1504   CO2 24 08/09/2011 1658   GLUCOSE 85 05/05/2019 1504   GLUCOSE 93 08/09/2011 1658   BUN 13 05/05/2019 1504   BUN 10 08/09/2011 1658   CREATININE 1.07 (H) 05/05/2019 1504   CREATININE 0.98 08/09/2011 1658   CALCIUM 9.0 05/05/2019 1504   CALCIUM 8.6 08/09/2011 1658   PROT 7.0 05/05/2019 1504   PROT 7.8 04/08/2011 1714   ALBUMIN 4.4 05/05/2019 1504   ALBUMIN 4.1 04/08/2011 1714   AST 17 05/05/2019 1504   AST 22 04/08/2011 1714   ALT 16 05/05/2019 1504   ALT 21 04/08/2011 1714   ALKPHOS 46 05/05/2019 1504   ALKPHOS 37 (L) 04/08/2011 1714   BILITOT 0.5 05/05/2019 1504   BILITOT 0.6 04/08/2011 1714   GFRNONAA >60 05/05/2019 1504   GFRNONAA >60 08/09/2011 1658   GFRAA >60 05/05/2019 1504   GFRAA >60 08/09/2011 1658     Assessment and Plan: 1. Chronic anticoagulation   2. Elevated serum creatinine   3. Heterozygous factor V Leiden mutation ([place])   4. History of pulmonary embolism   5. NSIP (nonspecific interstitial pneumonitis) ([place])    #  History of PE,  heterozygous factor V Leiden mutation Currently on Eliquis 2.5 mg twice daily for maintenance. Tolerates well.  Continue.  #NSIP, on sulfasalazine and CellCept.  Follow-up with pulmonology. #Patient's creatinine has increased to 1.07, GFR above 60. Likely side effects from CellCept.  Continue to monitor.  Follow Up Instructions: Follow-up in 6 months.   I discussed the assessment and treatment plan with the patient. The patient was provided an opportunity to ask questions and all were answered. The patient agreed with the plan and demonstrated an understanding of the instructions.  The patient was advised to call back or seek an in-person evaluation if the symptoms worsen or if the condition fails to improve as anticipated.    Earlie Server, MD 05/06/2019 8:27 PM

## 2019-05-27 DIAGNOSIS — D239 Other benign neoplasm of skin, unspecified: Secondary | ICD-10-CM

## 2019-05-27 HISTORY — DX: Other benign neoplasm of skin, unspecified: D23.9

## 2019-08-09 ENCOUNTER — Other Ambulatory Visit: Payer: Self-pay | Admitting: Oncology

## 2019-08-11 ENCOUNTER — Other Ambulatory Visit
Admission: RE | Admit: 2019-08-11 | Discharge: 2019-08-11 | Disposition: A | Discharge status: Home / Self Care | Payer: BC Managed Care – PPO | Source: Ambulatory Visit | Attending: Internal Medicine | Admitting: Internal Medicine

## 2019-08-11 DIAGNOSIS — M25461 Effusion, right knee: Secondary | ICD-10-CM | POA: Diagnosis present

## 2019-08-11 DIAGNOSIS — M059 Rheumatoid arthritis with rheumatoid factor, unspecified: Secondary | ICD-10-CM | POA: Insufficient documentation

## 2019-08-11 DIAGNOSIS — R55 Syncope and collapse: Secondary | ICD-10-CM | POA: Insufficient documentation

## 2019-08-11 LAB — SYNOVIAL CELL COUNT + DIFF, W/ CRYSTALS
Crystals, Fluid: NONE SEEN
Eosinophils-Synovial: 0 %
Lymphocytes-Synovial Fld: 12 %
Monocyte-Macrophage-Synovial Fluid: 84 %
Neutrophil, Synovial: 4 %
WBC, Synovial: 473 /mm3 — ABNORMAL HIGH (ref 0–200)

## 2019-08-15 LAB — BODY FLUID CULTURE
Culture: NO GROWTH
Gram Stain: NONE SEEN

## 2019-11-01 ENCOUNTER — Inpatient Hospital Stay (HOSPITAL_BASED_OUTPATIENT_CLINIC_OR_DEPARTMENT_OTHER): Payer: BC Managed Care – PPO | Admitting: Oncology

## 2019-11-01 ENCOUNTER — Inpatient Hospital Stay: Payer: BC Managed Care – PPO | Attending: Oncology

## 2019-11-01 ENCOUNTER — Other Ambulatory Visit: Payer: Self-pay

## 2019-11-01 ENCOUNTER — Encounter: Payer: Self-pay | Admitting: Oncology

## 2019-11-01 DIAGNOSIS — D6851 Activated protein C resistance: Secondary | ICD-10-CM | POA: Insufficient documentation

## 2019-11-01 DIAGNOSIS — Z86711 Personal history of pulmonary embolism: Secondary | ICD-10-CM | POA: Diagnosis not present

## 2019-11-01 DIAGNOSIS — F329 Major depressive disorder, single episode, unspecified: Secondary | ICD-10-CM | POA: Diagnosis not present

## 2019-11-01 DIAGNOSIS — R7989 Other specified abnormal findings of blood chemistry: Secondary | ICD-10-CM

## 2019-11-01 DIAGNOSIS — J8489 Other specified interstitial pulmonary diseases: Secondary | ICD-10-CM | POA: Diagnosis not present

## 2019-11-01 DIAGNOSIS — F419 Anxiety disorder, unspecified: Secondary | ICD-10-CM | POA: Insufficient documentation

## 2019-11-01 DIAGNOSIS — Z7901 Long term (current) use of anticoagulants: Secondary | ICD-10-CM | POA: Diagnosis not present

## 2019-11-01 DIAGNOSIS — Z79899 Other long term (current) drug therapy: Secondary | ICD-10-CM | POA: Insufficient documentation

## 2019-11-01 LAB — COMPREHENSIVE METABOLIC PANEL
ALT: 15 U/L (ref 0–44)
AST: 20 U/L (ref 15–41)
Albumin: 4.3 g/dL (ref 3.5–5.0)
Alkaline Phosphatase: 49 U/L (ref 38–126)
Anion gap: 10 (ref 5–15)
BUN: 16 mg/dL (ref 6–20)
CO2: 24 mmol/L (ref 22–32)
Calcium: 8.8 mg/dL — ABNORMAL LOW (ref 8.9–10.3)
Chloride: 103 mmol/L (ref 98–111)
Creatinine, Ser: 1.04 mg/dL — ABNORMAL HIGH (ref 0.44–1.00)
GFR calc Af Amer: >60 mL/min (ref >60)
GFR calc non Af Amer: >60 mL/min (ref >60)
Glucose, Bld: 93 mg/dL (ref 70–99)
Potassium: 4.3 mmol/L (ref 3.5–5.1)
Sodium: 137 mmol/L (ref 135–145)
Total Bilirubin: 0.7 mg/dL (ref 0.3–1.2)
Total Protein: 6.7 g/dL (ref 6.5–8.1)

## 2019-11-01 LAB — CBC WITH DIFFERENTIAL/PLATELET
Abs Immature Granulocytes: 0.01 10*3/uL (ref 0.00–0.07)
Basophils Absolute: 0 10*3/uL (ref 0.0–0.1)
Basophils Relative: 1 %
Eosinophils Absolute: 0.1 10*3/uL (ref 0.0–0.5)
Eosinophils Relative: 2 %
HCT: 37.3 % (ref 36.0–46.0)
Hemoglobin: 12.7 g/dL (ref 12.0–15.0)
Immature Granulocytes: 0 %
Lymphocytes Relative: 25 %
Lymphs Abs: 0.9 10*3/uL (ref 0.7–4.0)
MCH: 30 pg (ref 26.0–34.0)
MCHC: 34 g/dL (ref 30.0–36.0)
MCV: 88.2 fL (ref 80.0–100.0)
Monocytes Absolute: 0.5 10*3/uL (ref 0.1–1.0)
Monocytes Relative: 14 %
Neutro Abs: 2.1 10*3/uL (ref 1.7–7.7)
Neutrophils Relative %: 58 %
Platelets: 207 10*3/uL (ref 150–400)
RBC: 4.23 MIL/uL (ref 3.87–5.11)
RDW: 14.1 % (ref 11.5–15.5)
WBC: 3.5 10*3/uL — ABNORMAL LOW (ref 4.0–10.5)
nRBC: 0 % (ref 0.0–0.2)

## 2019-11-01 NOTE — Progress Notes (Signed)
HEMATOLOGY-ONCOLOGY TeleHEALTH VISIT PROGRESS NOTE  I connected with Pamela Mills on 11/01/19 at  2:45 PM EDT by video enabled telemedicine visit and verified that I am speaking with the correct person using two identifiers. I discussed the limitations, risks, security and privacy concerns of performing an evaluation and management service by telemedicine and the availability of in-person appointments. I also discussed with the patient that there may be a patient responsible charge related to this service. The patient expressed understanding and agreed to proceed.   Other persons participating in the visit and their role in the encounter:  None  Patient's location: At work Provider's location: office Chief Complaint: Follow-up for history of pulmonary embolism   INTERVAL HISTORY Pamela Mills is a 39 y.o. female who has above history reviewed by me today presents for follow up visit for management of history of pulmonary embolism Problems and complaints are listed below:  Patient has been on chronic anticoagulation with Eliquis 2.5 mg twice daily for prophylaxis for previous history of unprovoked pulmonary embolism. She also follows up with pulmonologist for interstitial pneumonitis.  On CellCept. Patient also will need high risk OB/GYN for discussion of possible pregnancy in the near future after CellCept being weaned off.  Review of Systems  Constitutional: Negative for appetite change, chills, fatigue and fever.  HENT:   Negative for hearing loss and voice change.   Eyes: Negative for eye problems.  Respiratory: Negative for chest tightness and cough.   Cardiovascular: Negative for chest pain.  Gastrointestinal: Negative for abdominal distention, abdominal pain and blood in stool.  Endocrine: Negative for hot flashes.  Genitourinary: Negative for difficulty urinating and frequency.   Musculoskeletal: Negative for arthralgias.  Skin: Negative for itching and rash.  Neurological:  Negative for extremity weakness.  Hematological: Negative for adenopathy.  Psychiatric/Behavioral: Negative for confusion.    Past Medical History:  Diagnosis Date  . Anxiety   . Arthritis   . Cancer ([place])    skin  . Collagen vascular disease ([place])   . Depression   . Diverticulitis   . Factor 5 Leiden mutation, heterozygous ([place]) 2018  . GERD (gastroesophageal reflux disease)   . Migraines   . Preterm labor   . Pulmonary embolism ([place])   . Vaginal Pap smear, abnormal    Past Surgical History:  Procedure Laterality Date  . CHOLECYSTECTOMY N/A 06/29/2015   Procedure: LAPAROSCOPIC CHOLECYSTECTOMY;  Surgeon: Stark Klein, MD;  Location: [facility];  Service: General;  Laterality: N/A;  . CHROMOPERTUBATION Right 10/17/2017   Procedure: CHROMOPERTUBATION;  Surgeon: Ward, Honor Loh, MD;  Location: [facility];  Service: Gynecology;  Laterality: Right;  . [place] OF UTERUS  2016  . DILATION AND EVACUATION N/A 11/10/2014   Procedure: DILATATION AND EVACUATION;  Surgeon: Honor Loh Ward, MD;  Location: [facility];  Service: Gynecology;  Laterality: N/A;  . FLEXIBLE BRONCHOSCOPY N/A 02/18/2018   Procedure: FLEXIBLE BRONCHOSCOPY;  Surgeon: Ottie Glazier, MD;  Location: [facility];  Service: Thoracic;  Laterality: N/A;  . FOOT SURGERY Left 01/2012  . FOOT SURGERY Left 2013   Plantar Fasiciatis  . LAPAROSCOPIC ENDOMETRIOSIS FULGURATION  2006  . LAPAROSCOPIC UNILATERAL SALPINGO OOPHERECTOMY Left 10/17/2017   Procedure: LAPAROSCOPIC UNILATERAL SALPINGO OOPHORECTOMY;  Surgeon: Ward, Honor Loh, MD;  Location: [facility];  Service: Gynecology;  Laterality: Left;  . LAPAROSCOPY N/A 10/17/2017   Procedure: LAPAROSCOPY OPERATIVE, Raeanne Gathers;  Surgeon: Ward, Honor Loh, MD;  Location: [facility];  Service: Gynecology;  Laterality: N/A;  .  oophotectomy Left 09/2017   2 cysts on the same side also removed  . UPPER GI ENDOSCOPY      Family History  Problem Relation Age of  Onset  . Ovarian cancer Mother 76  . Rheumatologic disease Maternal Aunt   . Pancreatic cancer Maternal Grandfather   . Colon cancer Paternal Grandmother   . Breast cancer Neg Hx     Social History   Socioeconomic History  . Marital status: Married    Spouse name: [name]  . Number of children: Not on file  . Years of education: Not on file  . Highest education level: Not on file  Occupational History  . Not on file  Tobacco Use  . Smoking status: Never Smoker  . Smokeless tobacco: Never Used  Vaping Use  . Vaping Use: Never used  Substance and Sexual Activity  . Alcohol use: No  . Drug use: No  . Sexual activity: Not Currently  Other Topics Concern  . Not on file  Social History Narrative  . Not on file   Social Determinants of Health   Financial Resource Strain:   . Difficulty of Paying Living Expenses:   Food Insecurity:   . Worried About Charity fundraiser in the Last Year:   . Arboriculturist in the Last Year:   Transportation Needs:   . Film/video editor (Medical):   Marland Kitchen Lack of Transportation (Non-Medical):   Physical Activity:   . Days of Exercise per Week:   . Minutes of Exercise per Session:   Stress:   . Feeling of Stress :   Social Connections:   . Frequency of Communication with Friends and Family:   . Frequency of Social Gatherings with Friends and Family:   . Attends Religious Services:   . Active Member of Clubs or Organizations:   . Attends Archivist Meetings:   Marland Kitchen Marital Status:   Intimate Partner Violence:   . Fear of Current or Ex-Partner:   . Emotionally Abused:   Marland Kitchen Physically Abused:   . Sexually Abused:     Current Outpatient Medications on File Prior to Visit  Medication Sig Dispense Refill  . acetaminophen (TYLENOL) 500 MG tablet Take 1,000 mg by mouth every 8 (eight) hours as needed (pain).    . cetirizine (ZYRTEC) 10 MG tablet Take 10 mg by mouth daily.    . Cholecalciferol (VITAMIN D3) 1000 units CAPS Take 1,000  Units by mouth daily.    Marland Kitchen ELIQUIS 2.5 MG TABS tablet TAKE 1 TABLET BY MOUTH TWICE A DAY 60 tablet 3  . escitalopram (LEXAPRO) 5 MG tablet Take 15 mg by mouth every evening.     . folic acid (FOLVITE) 1 MG tablet Take 1 mg by mouth daily.  11  . Galcanezumab-gnlm 120 MG/ML SOAJ Inject 120 mg into the skin every 28 (twenty-eight) days.    Marland Kitchen LORazepam (ATIVAN) 0.5 MG tablet Take 0.5 mg by mouth 2 (two) times daily as needed for anxiety.     Marland Kitchen omeprazole (PRILOSEC) 20 MG capsule Take 20 mg by mouth daily.     Marland Kitchen sulfaSALAzine (AZULFIDINE) 500 MG EC tablet Take 500 mg by mouth 2 (two) times daily.  0  . SUMAtriptan (IMITREX) 50 MG tablet Take 50 mg by mouth every 2 (two) hours as needed for migraine.     . topiramate (TOPAMAX) 25 MG tablet Take 50 mg by mouth daily.     Marland Kitchen triamcinolone (NASACORT ALLERGY 24HR) 55  MCG/ACT AERO nasal inhaler Place 2 sprays into the nose daily.    . vitamin B-12 (CYANOCOBALAMIN) 1000 MCG tablet Take 1,000 mcg by mouth daily.     No current facility-administered medications on file prior to visit.    Allergies  Allergen Reactions  . Other Other (See Comments)    Gluten causes inflammation in GI system.  . Gluten Meal Diarrhea and Nausea And Vomiting       Observations/Objective: Today's Vitals   11/01/19 1433  PainSc: 0-No pain   There is no height or weight on file to calculate BMI.  Physical Exam Neurological:     Mental Status: She is alert.     CBC    Component Value Date/Time   WBC 3.5 (L) 11/01/2019 1052   RBC 4.23 11/01/2019 1052   HGB 12.7 11/01/2019 1052   HGB 13.0 08/09/2011 1658   HCT 37.3 11/01/2019 1052   HCT 37.6 08/09/2011 1658   PLT 207 11/01/2019 1052   PLT 175 08/09/2011 1658   MCV 88.2 11/01/2019 1052   MCV 88 08/09/2011 1658   MCH 30.0 11/01/2019 1052   MCHC 34.0 11/01/2019 1052   RDW 14.1 11/01/2019 1052   RDW 12.6 08/09/2011 1658   LYMPHSABS 0.9 11/01/2019 1052   MONOABS 0.5 11/01/2019 1052   EOSABS 0.1 11/01/2019  1052   BASOSABS 0.0 11/01/2019 1052    CMP     Component Value Date/Time   NA 137 11/01/2019 1052   NA 142 08/09/2011 1658   K 4.3 11/01/2019 1052   K 3.7 08/09/2011 1658   CL 103 11/01/2019 1052   CL 108 (H) 08/09/2011 1658   CO2 24 11/01/2019 1052   CO2 24 08/09/2011 1658   GLUCOSE 93 11/01/2019 1052   GLUCOSE 93 08/09/2011 1658   BUN 16 11/01/2019 1052   BUN 10 08/09/2011 1658   CREATININE 1.04 (H) 11/01/2019 1052   CREATININE 0.98 08/09/2011 1658   CALCIUM 8.8 (L) 11/01/2019 1052   CALCIUM 8.6 08/09/2011 1658   PROT 6.7 11/01/2019 1052   PROT 7.8 04/08/2011 1714   ALBUMIN 4.3 11/01/2019 1052   ALBUMIN 4.1 04/08/2011 1714   AST 20 11/01/2019 1052   AST 22 04/08/2011 1714   ALT 15 11/01/2019 1052   ALT 21 04/08/2011 1714   ALKPHOS 49 11/01/2019 1052   ALKPHOS 37 (L) 04/08/2011 1714   BILITOT 0.7 11/01/2019 1052   BILITOT 0.6 04/08/2011 1714   GFRNONAA >60 11/01/2019 1052   GFRNONAA >60 08/09/2011 1658   GFRAA >60 11/01/2019 1052   GFRAA >60 08/09/2011 1658     Assessment and Plan: 1. Heterozygous factor V Leiden mutation ([place])   2. History of pulmonary embolism   3. NSIP (nonspecific interstitial pneumonitis) ([place])     Labs reviewed and discussed with patient. History of unprovoked PE, continue chronic anticoagulation with Eliquis 2.5 mg twice daily. History of factor V Leiden mutation heterozygous, history of PE, discussed with patient that she most likely will need anticoagulation prophylaxis antepartum.  She is meeting high risk pregnancy OB/GYN.  Encourage patient to further discuss today. Eliquis is not recommended during pregnancy and I would recommend patient to be switched to Lovenox if she plans to conceive. For chronic interstitial pneumonitis, continue follow-up with pulmonology. Follow Up Instructions: 6 months or earlier if needed.   I discussed the assessment and treatment plan with the patient. The patient was provided an opportunity to ask  questions and all were answered. The patient agreed with the  plan and demonstrated an understanding of the instructions.  The patient was advised to call back or seek an in-person evaluation if the symptoms worsen or if the condition fails to improve as anticipated.    Earlie Server, MD 11/01/2019 5:10 PM

## 2019-12-06 ENCOUNTER — Other Ambulatory Visit: Payer: Self-pay | Admitting: Oncology

## 2020-04-12 ENCOUNTER — Other Ambulatory Visit: Payer: Self-pay | Admitting: Obstetrics & Gynecology

## 2020-04-12 DIAGNOSIS — Z1231 Encounter for screening mammogram for malignant neoplasm of breast: Secondary | ICD-10-CM

## 2020-04-13 ENCOUNTER — Other Ambulatory Visit: Payer: Self-pay | Admitting: Oncology

## 2020-04-13 NOTE — Telephone Encounter (Signed)
P/A request submitted via Cover My Meds, Key: [place]

## 2020-04-14 ENCOUNTER — Telehealth: Payer: Self-pay

## 2020-04-14 MED ORDER — RIVAROXABAN 10 MG PO TABS: 10.0000 mg | ORAL_TABLET | Freq: Every day | ORAL | 3 refills | Status: DC

## 2020-04-14 NOTE — Telephone Encounter (Signed)
MyChart message sent to patient notifying of need for med change.  Will send new rx when she has confirmed receipt of message.

## 2020-04-14 NOTE — Telephone Encounter (Signed)
Eliquis P/A was denied with Available Formulary Alternatives: Xarelto

## 2020-04-14 NOTE — Telephone Encounter (Signed)
-----   Message from Earlie Server, MD sent at 04/14/2020  8:42 AM EST ----- Regarding: RE: denied eliquis [facility] to switch to Xarelto 77m daily. Thanks. Please let patient know about the reason of change of Rx. Recommend her to use up her currently supply of eliquis, then switch to Xarelto. Thanks.  ----- Message ----- From: SVanice Sarah CMA Sent: 04/14/2020   8:32 AM EST To: EEvelina Dun RN, HVanice Sarah CMA, # Subject: FW: denied eliquis [facility]                Eliquis P/A was denied with Available Formulary Alternatives: Xarelto.  Please advise.   ----- Message ----- From: HGloris Ham RN Sent: 04/14/2020   8:27 AM EST To: EEvelina Dun RN, RSecundino Ginger # Subject: RE: denied eliquis [facility]                 ----- Message ----- From: FSecundino GingerSent: 04/14/2020   8:22 AM EST To: EEvelina Dun RN, HGloris Ham RN, # Subject: denied eliquis [facility]                    Denied Eliquis [facility] scanned to Epic under Media tab.

## 2020-04-21 MED ORDER — RIVAROXABAN 10 MG PO TABS: 10.0000 mg | ORAL_TABLET | Freq: Every day | ORAL | 3 refills | Status: DC

## 2020-04-21 NOTE — Addendum Note (Signed)
Addended by: Vanice Sarah on: 04/21/2020 04:13 PM   Modules accepted: Orders

## 2020-04-26 ENCOUNTER — Other Ambulatory Visit: Payer: Self-pay

## 2020-04-26 ENCOUNTER — Ambulatory Visit
Admission: RE | Admit: 2020-04-26 | Discharge: 2020-04-26 | Disposition: A | Discharge status: Home / Self Care | Payer: BC Managed Care – PPO | Source: Ambulatory Visit | Attending: Obstetrics & Gynecology | Admitting: Obstetrics & Gynecology

## 2020-04-26 DIAGNOSIS — Z1231 Encounter for screening mammogram for malignant neoplasm of breast: Secondary | ICD-10-CM | POA: Diagnosis present

## 2020-05-02 ENCOUNTER — Encounter: Payer: Self-pay | Admitting: Oncology

## 2020-05-02 ENCOUNTER — Inpatient Hospital Stay: Payer: BC Managed Care – PPO | Attending: Oncology

## 2020-05-02 ENCOUNTER — Inpatient Hospital Stay (HOSPITAL_BASED_OUTPATIENT_CLINIC_OR_DEPARTMENT_OTHER): Payer: BC Managed Care – PPO | Admitting: Oncology

## 2020-05-02 VITALS — BP 131/62 | HR 82 | Temp 97.9°F | Resp 18 | Wt 255.5 lb

## 2020-05-02 DIAGNOSIS — D6851 Activated protein C resistance: Secondary | ICD-10-CM | POA: Diagnosis not present

## 2020-05-02 DIAGNOSIS — Z7901 Long term (current) use of anticoagulants: Secondary | ICD-10-CM | POA: Diagnosis not present

## 2020-05-02 DIAGNOSIS — R519 Headache, unspecified: Secondary | ICD-10-CM | POA: Diagnosis not present

## 2020-05-02 DIAGNOSIS — J8489 Other specified interstitial pulmonary diseases: Secondary | ICD-10-CM | POA: Diagnosis not present

## 2020-05-02 DIAGNOSIS — Z86711 Personal history of pulmonary embolism: Secondary | ICD-10-CM

## 2020-05-02 DIAGNOSIS — J849 Interstitial pulmonary disease, unspecified: Secondary | ICD-10-CM | POA: Diagnosis not present

## 2020-05-02 DIAGNOSIS — Z800 Family history of malignant neoplasm of digestive organs: Secondary | ICD-10-CM | POA: Diagnosis not present

## 2020-05-02 DIAGNOSIS — Z8041 Family history of malignant neoplasm of ovary: Secondary | ICD-10-CM | POA: Diagnosis not present

## 2020-05-02 LAB — CBC WITH DIFFERENTIAL/PLATELET
Abs Immature Granulocytes: 0.02 10*3/uL (ref 0.00–0.07)
Basophils Absolute: 0.1 10*3/uL (ref 0.0–0.1)
Basophils Relative: 1 %
Eosinophils Absolute: 0.2 10*3/uL (ref 0.0–0.5)
Eosinophils Relative: 4 %
HCT: 38.5 % (ref 36.0–46.0)
Hemoglobin: 13.2 g/dL (ref 12.0–15.0)
Immature Granulocytes: 0 %
Lymphocytes Relative: 15 %
Lymphs Abs: 0.8 10*3/uL (ref 0.7–4.0)
MCH: 31.4 pg (ref 26.0–34.0)
MCHC: 34.3 g/dL (ref 30.0–36.0)
MCV: 91.4 fL (ref 80.0–100.0)
Monocytes Absolute: 0.6 10*3/uL (ref 0.1–1.0)
Monocytes Relative: 10 %
Neutro Abs: 3.7 10*3/uL (ref 1.7–7.7)
Neutrophils Relative %: 70 %
Platelets: 232 10*3/uL (ref 150–400)
RBC: 4.21 MIL/uL (ref 3.87–5.11)
RDW: 13.2 % (ref 11.5–15.5)
WBC: 5.4 10*3/uL (ref 4.0–10.5)
nRBC: 0 % (ref 0.0–0.2)

## 2020-05-02 LAB — COMPREHENSIVE METABOLIC PANEL
ALT: 15 U/L (ref 0–44)
AST: 20 U/L (ref 15–41)
Albumin: 4.2 g/dL (ref 3.5–5.0)
Alkaline Phosphatase: 47 U/L (ref 38–126)
Anion gap: 7 (ref 5–15)
BUN: 12 mg/dL (ref 6–20)
CO2: 24 mmol/L (ref 22–32)
Calcium: 8.8 mg/dL — ABNORMAL LOW (ref 8.9–10.3)
Chloride: 105 mmol/L (ref 98–111)
Creatinine, Ser: 0.88 mg/dL (ref 0.44–1.00)
GFR, Estimated: >60 mL/min (ref >60)
Glucose, Bld: 91 mg/dL (ref 70–99)
Potassium: 4 mmol/L (ref 3.5–5.1)
Sodium: 136 mmol/L (ref 135–145)
Total Bilirubin: 0.6 mg/dL (ref 0.3–1.2)
Total Protein: 7 g/dL (ref 6.5–8.1)

## 2020-05-02 NOTE — Progress Notes (Signed)
Hematology/Oncology follow-up [place]  Patient Care Team: Adin Hector, MD as PCP - General (Internal Medicine) Earlie Server, MD as Consulting Physician (Hematology and Oncology)  Reason for visit Follow-up for PE  Referring Physician: Dr.Fleming Herbon.  HISTORY OF PRESENTING ILLNESS:  Pamela Mills 40 y.o.  female with past medical history listed as below who  follow-up for management of anticoagulation due to recent PE. Since  November 2017, patient started to experience persistent dry cough, exertional shortness of breath, progressive swelling in hands and feet, she was referred to see rheumatology. Workup showed seropositive rheumatoid arthritis, Negative anti-CCP but elevated RF Patient was  Prednisone and later started on methotrexate weekly (started on 08/15/2016) for treatment.. Patient's respiratory symptoms improved and then worsen again. At that point she was referred by her primary care to see Dr. Raul Del pulmonary for evaluation. CT without contrast on 07/30/2016 showed basilar and apparent from predominant ground glass opacities in the architectural distortion, with possible minimum lower lobe bronchiectasis suggesting interstitial lung disease. There is also mild the bilateral axillary, subpectoral, supraclavicular, mediastinal adenopathy.lymph nodes are increased in number, mildly enlarged. Largest left axillary measuring 11 mm short axis. Findings favor reactive/systemic process with autoimmune disorders.  Patient's rheumatology symptoms improved with treatment. Patient had wax and wane of her respiratory symptoms. In the summer, she has had 2 round trips of 8 hour car ride back and forth to [place] where her father lives. Most recent one was on August 5. She has felt lower extremity swelling for which she has to put on stocking to reduce swelling. Also she felt a little stretchy feeling on her cough. She mentioned to Dr. Raul Del who ordered a follow-up CT  scan. CT without contrast chest on 11/26/2016 showed patchy groundglass in the septal thickening in both lower lobes, less severe than 07/27/2016. There is new rounded consolidation in the left lower lobe, possible infectious etiology. No definitive evidence of interstitial lung disease. Meanwhile her respiratory symptoms has become worse. She has experienced coughing, exertional shortness of breath, and mild hemoptysis. This triggered a d-dimer testing which came back high. Patient had CT anginal chest PE protocol on 11/29/2016 which showed acute appearing nonconclusive pulmonary emboli within several segmental pulmonary artery branches to the left lower lobe. Small pleural-based consolidation with the posterior aspect of the left lower lobe presumably associated focal pulmonary infarction. Associated small left pleural effusion. No evidence of associated right heart straining. Questionable focal low-density pulmonary embolus within the segmental pulmonary artery branch to the right middle lobe.   lower extremity ultrasound revealed no DVT within the right lower extremity. Patient was started on Eliquis 5 mg twice a day.  Patient has had some rheumatology/hypercoagulable workup done with [facility]. Reviewing her labs showed on 08/08/2016, she is tested negative for anti-Cardiolite pain antibody IgG < 9, IgM slightly elevated at 15. Beta-2 glycoprotein IgG and IgM normal, normal axial coronal phase phospholipid, DVvT normal, negative lupus anticoagulant.. Serum protein electrophoresis negative for M spike.  11/29/2016, homocystine 8, lupus anticoagulant was retested was negative, mildly low protein S activity at 48%, normal protein S antigen. Slightly low protein S antigen. Normal anti-thrombin function. She is tested positive for single [id number] mutation heterozygous factor V Leiden mutation.  Patient has a family history of ovarian cancer in her mom. She reports that she was tested negative for genetic  test.  She takes OCP since she was teenage.  This is her fist episode of blood clot. She was  taking of OCP and referred to see gynecology.  # 06/13/2017 Antiphospholipid panel was checked at last visit again.  Which showed negative panel except slightly elevated anti cardiolipin IgM at 15.  Repeat protein S activity was 75%, protein S antigen at 52%, mildly decreased.  slight positive anti cardiolipin IgM does not meet criteria for Antiphospholipid syndrome 02/02/18 repeat protein antigen was normal at 62%.  08/04/2018, decrease to Eliquis 2.5 mg twice daily.  NSIP (nonspecific interstitial pneumonitis)-patient follows up with rheumatologist and was  treated with CellCept and sulfasalazine.  INTERVAL HISTORY Pamela Mills is a 40 y.o. female who has above history reviewed by me today presents for follow-up management of pulmonary embolism.  Patient is now on Xarelto 10 mg daily due to lack of insurance coverage for Eliquis 2.5 mg twice daily.  She reports some headache and upset stomach recently which she is not sure if these are related to side effects of Xarelto or other etiologies. Patient is now on azathioprine 100 mg twice daily since December 2021.  She is also still on sulfasalazine. Overall she feels well.  No significant shortness of breath. She has establish care with high risk OB/GYN for plan of pregnancy.   ROS:  Review of Systems  Constitutional: Negative for appetite change, chills, fatigue and fever.  HENT:   Negative for hearing loss and voice change.   Eyes: Negative for eye problems.  Respiratory: Negative for chest tightness and cough.   Cardiovascular: Negative for chest pain.  Gastrointestinal: Negative for abdominal distention, abdominal pain and blood in stool.  Endocrine: Negative for hot flashes.  Genitourinary: Negative for difficulty urinating and frequency.   Musculoskeletal: Negative for arthralgias.  Skin: Negative for itching and rash.  Neurological:  Positive for headaches. Negative for extremity weakness.  Hematological: Negative for adenopathy.  Psychiatric/Behavioral: Negative for confusion.    MEDICAL HISTORY:  Past Medical History:  Diagnosis Date  . Anxiety   . Arthritis   . Cancer ([place])    skin  . Collagen vascular disease ([place])   . Depression   . Diverticulitis   . Dysplastic nevus 05/27/2019   R distal ant thigh - moderate  . Factor 5 Leiden mutation, heterozygous ([place]) 2018  . GERD (gastroesophageal reflux disease)   . Migraines   . Preterm labor   . Pulmonary embolism ([place])   . Vaginal Pap smear, abnormal     SURGICAL HISTORY: Past Surgical History:  Procedure Laterality Date  . CHOLECYSTECTOMY N/A 06/29/2015   Procedure: LAPAROSCOPIC CHOLECYSTECTOMY;  Surgeon: Stark Klein, MD;  [facility]: [facility];  Service: General;  Laterality: N/A;  . CHROMOPERTUBATION Right 10/17/2017   Procedure: CHROMOPERTUBATION;  Surgeon: Ward, Honor Loh, MD;  Location: [facility];  Service: Gynecology;  Laterality: Right;  . [place] OF UTERUS  2016  . DILATION AND EVACUATION N/A 11/10/2014   Procedure: DILATATION AND EVACUATION;  Surgeon: Honor Loh Ward, MD;  Location: [facility];  Service: Gynecology;  Laterality: N/A;  . FLEXIBLE BRONCHOSCOPY N/A 02/18/2018   Procedure: FLEXIBLE BRONCHOSCOPY;  Surgeon: Ottie Glazier, MD;  Location: [facility];  Service: Thoracic;  Laterality: N/A;  . FOOT SURGERY Left 01/2012  . FOOT SURGERY Left 2013   Plantar Fasiciatis  . LAPAROSCOPIC ENDOMETRIOSIS FULGURATION  2006  . LAPAROSCOPIC UNILATERAL SALPINGO OOPHERECTOMY Left 10/17/2017   Procedure: LAPAROSCOPIC UNILATERAL SALPINGO OOPHORECTOMY;  Surgeon: Ward, Honor Loh, MD;  Location: [facility];  Service: Gynecology;  Laterality: Left;  . LAPAROSCOPY N/A 10/17/2017   Procedure:  LAPAROSCOPY OPERATIVE, PERITOEAL STRIPPING;  Surgeon: Ward, Honor Loh, MD;  Location: [facility];  Service: Gynecology;  Laterality: N/A;  .  oophotectomy Left 09/2017   2 cysts on the same side also removed  . UPPER GI ENDOSCOPY      SOCIAL HISTORY: Social History   Socioeconomic History  . Marital status: Married    Spouse name: [name]  . Number of children: Not on file  . Years of education: Not on file  . Highest education level: Not on file  Occupational History  . Not on file  Tobacco Use  . Smoking status: Never Smoker  . Smokeless tobacco: Never Used  Vaping Use  . Vaping Use: Never used  Substance and Sexual Activity  . Alcohol use: No  . Drug use: No  . Sexual activity: Not Currently  Other Topics Concern  . Not on file  Social History Narrative  . Not on file   Social Determinants of Health   Financial Resource Strain: Not on file  Food Insecurity: Not on file  Transportation Needs: Not on file  Physical Activity: Not on file  Stress: Not on file  Social Connections: Not on file  Intimate Partner Violence: Not on file    FAMILY HISTORY: Family History  Problem Relation Age of Onset  . Ovarian cancer Mother 33  . Rheumatologic disease Maternal Aunt   . Pancreatic cancer Maternal Grandfather   . Colon cancer Paternal Grandmother   . Breast cancer Neg Hx     ALLERGIES:  is allergic to other and gluten meal.  MEDICATIONS:  Current Outpatient Medications  Medication Sig Dispense Refill  . acetaminophen (TYLENOL) 500 MG tablet Take 1,000 mg by mouth every 8 (eight) hours as needed (pain).    Marland Kitchen azathioprine (IMURAN) 100 MG tablet Take 100 mg by mouth in the morning and at bedtime.    . cetirizine (ZYRTEC) 10 MG tablet Take 10 mg by mouth daily.    . Cholecalciferol (VITAMIN D3) 1000 units CAPS Take 1,000 Units by mouth daily.    Marland Kitchen escitalopram (LEXAPRO) 5 MG tablet Take 15 mg by mouth every evening.     . folic acid (FOLVITE) 1 MG tablet Take 1 mg by mouth daily.  11  . Galcanezumab-gnlm 120 MG/ML SOAJ Inject 120 mg into the skin every 28 (twenty-eight) days.    Marland Kitchen LORazepam (ATIVAN) 0.5 MG  tablet Take 0.5 mg by mouth 2 (two) times daily as needed for anxiety.     Marland Kitchen omeprazole (PRILOSEC) 20 MG capsule Take 20 mg by mouth daily.     . rivaroxaban (XARELTO) 10 MG TABS tablet Take 1 tablet (10 mg total) by mouth daily. 30 tablet 3  . sulfaSALAzine (AZULFIDINE) 500 MG EC tablet Take 500 mg by mouth 2 (two) times daily.  0  . SUMAtriptan (IMITREX) 50 MG tablet Take 50 mg by mouth every 2 (two) hours as needed for migraine.     . topiramate (TOPAMAX) 25 MG tablet Take 50 mg by mouth daily.     Marland Kitchen triamcinolone (NASACORT) 55 MCG/ACT AERO nasal inhaler Place 2 sprays into the nose daily.    . vitamin B-12 (CYANOCOBALAMIN) 1000 MCG tablet Take 1,000 mcg by mouth daily.     No current facility-administered medications for this visit.      Marland Kitchen  PHYSICAL EXAMINATION: ECOG PERFORMANCE STATUS: 1 - Symptomatic but completely ambulatory Vitals:   05/02/20 1303  BP: 131/62  Pulse: 82  Resp: 18  Temp: 97.9 F (  36.6 C)   Filed Weights   05/02/20 1303  Weight: 255 lb 8 oz (115.9 kg)   Physical Exam Constitutional:      General: She is not in acute distress.    Appearance: She is not diaphoretic.  HENT:     Head: Normocephalic and atraumatic.     Nose: Nose normal.     Mouth/Throat:     Pharynx: No oropharyngeal exudate.  Eyes:     General: No scleral icterus.       Left eye: No discharge.     Conjunctiva/sclera: Conjunctivae normal.     Pupils: Pupils are equal, round, and reactive to light.  Cardiovascular:     Rate and Rhythm: Normal rate and regular rhythm.     Heart sounds: Normal heart sounds. No murmur heard.   Pulmonary:     Effort: Pulmonary effort is normal. No respiratory distress.     Breath sounds: Normal breath sounds. No wheezing or rales.  Chest:     Chest wall: No tenderness.  Abdominal:     General: Bowel sounds are normal. There is no distension.     Palpations: Abdomen is soft. There is no mass.     Tenderness: There is no abdominal tenderness. There  is no rebound.  Musculoskeletal:        General: Normal range of motion.     Cervical back: Normal range of motion and neck supple.  Lymphadenopathy:     Cervical: No cervical adenopathy.  Skin:    General: Skin is warm and dry.     Findings: No erythema.  Neurological:     Mental Status: She is alert and oriented to person, place, and time.     Cranial Nerves: No cranial nerve deficit.     Motor: No abnormal muscle tone.     Coordination: Coordination normal.  Psychiatric:        Mood and Affect: Affect normal.       RADIOGRAPHIC STUDIES: I have personally reviewed the radiological images as listed and agreed with the findings in the report. MM 3D SCREEN BREAST BILATERAL  Result Date: 04/27/2020 CLINICAL DATA:  Screening. EXAM: DIGITAL SCREENING BILATERAL MAMMOGRAM WITH TOMO AND CAD COMPARISON:  Previous exam(s). ACR Breast Density Category c: The breast tissue is heterogeneously dense, which may obscure small masses. FINDINGS: There are no findings suspicious for malignancy. The images were evaluated with computer-aided detection. IMPRESSION: No mammographic evidence of malignancy. A result letter of this screening mammogram will be mailed directly to the patient. RECOMMENDATION: Screening mammogram in one year. (Code:SM-B-01Y) BI-RADS CATEGORY  1: Negative. Electronically Signed   By: Ammie Ferrier M.D.   On: 04/27/2020 12:11      ASSESSMENT & PLAN:  1. Heterozygous factor V Leiden mutation ([place])   2. History of pulmonary embolism   3. NSIP (nonspecific interstitial pneumonitis) ([place])   4. Chronic anticoagulation    #History of pulmonary embolism in the context of OCP use, interstitial lung disease, heterozygous factor V Leiden mutation Patient tolerated chronic anticoagulation prophylaxis previously with Eliquis 2.5 mg twice daily.  Due to the lack of insurance coverage, she is now on Xarelto 10 mg daily.  I discussed with her about possible side effect profiles.  Unsure if  her headache and stomach issue are related to Xarelto or some other etiologies.  Will observe for now.  # NSIP (nonspecific interstitial pneumonitis)-patient follows up with rheumatologist and was  treated with CellCept and sulfasalazine.  #Family planning, patient plans  to conceive in the near future.  She has establish care with high risk OB/GYN at [facility] Discussed with patient that Eliquis or Xarelto are not recommended during pregnancy.  Patient was advised to call me when she start active plan of conceiving.  At that point we will switch patient to prophylactic Lovenox 40 mg daily.  Follow up in 6 months Orders Placed This Encounter  Procedures  . CBC with Differential/Platelet    Standing Status:   Future    Standing Expiration Date:   05/02/2021  . Comprehensive metabolic panel    Standing Status:   Future    Standing Expiration Date:   05/02/2021    Earlie Server, MD, PhD Hematology Oncology [place] at [facility] Pager-, 05/02/2020

## 2020-05-02 NOTE — Progress Notes (Signed)
Pt here for follow up.

## 2020-05-29 ENCOUNTER — Encounter: Payer: Self-pay | Admitting: Dermatology

## 2020-05-29 ENCOUNTER — Other Ambulatory Visit: Payer: Self-pay

## 2020-05-29 ENCOUNTER — Ambulatory Visit: Payer: BC Managed Care – PPO | Admitting: Dermatology

## 2020-05-29 DIAGNOSIS — D2271 Melanocytic nevi of right lower limb, including hip: Secondary | ICD-10-CM | POA: Diagnosis not present

## 2020-05-29 DIAGNOSIS — D1800 Hemangioma unspecified site: Secondary | ICD-10-CM

## 2020-05-29 DIAGNOSIS — L720 Epidermal cyst: Secondary | ICD-10-CM

## 2020-05-29 DIAGNOSIS — Z86018 Personal history of other benign neoplasm: Secondary | ICD-10-CM

## 2020-05-29 DIAGNOSIS — Z1283 Encounter for screening for malignant neoplasm of skin: Secondary | ICD-10-CM

## 2020-05-29 DIAGNOSIS — Z85828 Personal history of other malignant neoplasm of skin: Secondary | ICD-10-CM

## 2020-05-29 DIAGNOSIS — C4401 Basal cell carcinoma of skin of lip: Secondary | ICD-10-CM | POA: Diagnosis not present

## 2020-05-29 DIAGNOSIS — L814 Other melanin hyperpigmentation: Secondary | ICD-10-CM

## 2020-05-29 DIAGNOSIS — L578 Other skin changes due to chronic exposure to nonionizing radiation: Secondary | ICD-10-CM

## 2020-05-29 DIAGNOSIS — D229 Melanocytic nevi, unspecified: Secondary | ICD-10-CM

## 2020-05-29 DIAGNOSIS — D485 Neoplasm of uncertain behavior of skin: Secondary | ICD-10-CM

## 2020-05-29 DIAGNOSIS — L821 Other seborrheic keratosis: Secondary | ICD-10-CM

## 2020-05-29 NOTE — Patient Instructions (Signed)

## 2020-05-29 NOTE — Progress Notes (Addendum)
Follow-Up Visit   Subjective  Pamela Mills is a 40 y.o. female who presents for the following: Annual Exam (Hx dysplastic nevi ). The patient presents for Total-Body Skin Exam (TBSE) for skin cancer screening and mole check. Patient has noticed a lesion on her R upper lip that has been there for about a year and will not resolve.   The following portions of the chart were reviewed this encounter and updated as appropriate:   Tobacco  Allergies  Meds  Problems  Med Hx  Surg Hx  Fam Hx     Review of Systems:  No other skin or systemic complaints except as noted in HPI or Assessment and Plan.  Objective  Well appearing patient in no apparent distress; mood and affect are within normal limits.  A full examination was performed including scalp, head, eyes, ears, nose, lips, neck, chest, axillae, abdomen, back, buttocks, bilateral upper extremities, bilateral lower extremities, hands, feet, fingers, toes, fingernails, and toenails. All findings within normal limits unless otherwise noted below.  Objective  R upper cutaneous lip: 0.6 x 0.4 cm pink papule        Objective  Face: Smooth white papule(s).   Objective  R med foot: 0.2 cm brown macule   Images    Assessment & Plan  Neoplasm of uncertain behavior of skin R upper cutaneous lip Skin / nail biopsy Type of biopsy: tangential   Informed consent: discussed and consent obtained   Timeout: patient name, date of birth, surgical site, and procedure verified   Procedure prep:  Patient was prepped and draped in usual sterile fashion Prep type:  Isopropyl alcohol Anesthesia: the lesion was anesthetized in a standard fashion   Anesthetic:  1% lidocaine w/ epinephrine 1-100,000 buffered w/ 8.4% NaHCO3 Instrument used: flexible razor blade   Hemostasis achieved with: pressure, aluminum chloride and electrodesiccation   Outcome: patient tolerated procedure well   Post-procedure details: sterile dressing applied and  wound care instructions given   Dressing type: bandage and petrolatum    Specimen 1 - Surgical pathology Differential Diagnosis: D48.5 r/o BCC vs other  Check Margins: No 0.6 x 0.4 cm pink papule - tiny specimen   [place] Face Benign-appearing.  Observation.  Call clinic for new or changing lesions.  Recommend daily use of broad spectrum spf 30+ sunscreen to sun-exposed areas.   Nevus R med foot Benign-appearing.  Observation.  Call clinic for new or changing lesions.  Recommend daily use of broad spectrum spf 30+ sunscreen to sun-exposed areas.   Skin cancer screening  Lentigines - Scattered tan macules - Due to sun exposure - Benign-appering, observe - Recommend daily broad spectrum sunscreen SPF 30+ to sun-exposed areas, reapply every 2 hours as needed. - Call for any changes  Seborrheic Keratoses - Stuck-on, waxy, tan-brown papules and plaques  - Discussed benign etiology and prognosis. - Observe - Call for any changes  Melanocytic Nevi - Tan-brown and/or pink-flesh-colored symmetric macules and papules - Benign appearing on exam today  - Observation - Call clinic for new or changing moles - Recommend daily use of broad spectrum spf 30+ sunscreen to sun-exposed areas.   Hemangiomas - Red papules - Discussed benign nature - Observe - Call for any changes  Actinic Damage - Chronic, secondary to cumulative UV/sun exposure - diffuse scaly erythematous macules with underlying dyspigmentation - Recommend daily broad spectrum sunscreen SPF 30+ to sun-exposed areas, reapply every 2 hours as needed.  - Call for new or changing lesions.  History of Basal Cell Carcinoma of the Skin - No evidence of recurrence today - Recommend regular full body skin exams - Recommend daily broad spectrum sunscreen SPF 30+ to sun-exposed areas, reapply every 2 hours as needed.  - Call if any new or changing lesions are noted between office visits  History of Dysplastic Nevus  - No  evidence of recurrence today - Recommend regular full body skin exams - Recommend daily broad spectrum sunscreen SPF 30+ to sun-exposed areas, reapply every 2 hours as needed.  - Call if any new or changing lesions are noted between office visits  Skin cancer screening performed today.  Return in about 1 year (around 05/29/2021) for TBSE.  Luther Redo, CMA, am acting as scribe for Sarina Ser, MD .  Documentation: I have reviewed the above documentation for accuracy and completeness, and I agree with the above.  Sarina Ser, MD

## 2020-05-30 ENCOUNTER — Encounter: Payer: Self-pay | Admitting: Dermatology

## 2020-06-06 ENCOUNTER — Telehealth: Payer: Self-pay

## 2020-06-06 DIAGNOSIS — C4401 Basal cell carcinoma of skin of lip: Secondary | ICD-10-CM

## 2020-06-06 NOTE — Telephone Encounter (Signed)
-----   Message from Ralene Bathe, MD sent at 06/01/2020 12:51 PM EST ----- Diagnosis Skin , right upper cutaneous lip BASAL CELL CARCINOMA, SUPERFICIAL AND NODULAR PATTERNS,  Cancer - BCC Schedule for MOHS

## 2020-06-06 NOTE — Telephone Encounter (Signed)
Patient advised of BX results and referral sent to Dr. Jonathon Jordan office.

## 2020-06-06 NOTE — Addendum Note (Signed)
Addended by: Johnsie Kindred R on: 06/06/2020 10:14 AM   Modules accepted: Orders

## 2020-06-06 NOTE — Telephone Encounter (Signed)
Left message on voicemail to return my call.  

## 2020-07-05 DIAGNOSIS — C4491 Basal cell carcinoma of skin, unspecified: Secondary | ICD-10-CM

## 2020-07-05 HISTORY — DX: Basal cell carcinoma of skin, unspecified: C44.91

## 2020-08-02 ENCOUNTER — Encounter: Payer: Self-pay | Admitting: Oncology

## 2020-08-02 ENCOUNTER — Other Ambulatory Visit: Payer: Self-pay | Admitting: Oncology

## 2020-08-02 MED ORDER — ENOXAPARIN SODIUM 40 MG/0.4ML IJ SOSY: 40.0000 mg | PREFILLED_SYRINGE | INTRAMUSCULAR | 2 refills | Status: DC

## 2020-08-16 ENCOUNTER — Other Ambulatory Visit: Payer: Self-pay | Admitting: Oncology

## 2020-10-30 ENCOUNTER — Encounter: Payer: Self-pay | Admitting: Oncology

## 2020-10-30 ENCOUNTER — Inpatient Hospital Stay: Payer: BC Managed Care – PPO | Admitting: Oncology

## 2020-10-30 ENCOUNTER — Inpatient Hospital Stay: Payer: BC Managed Care – PPO | Attending: Oncology

## 2020-10-30 VITALS — BP 123/80 | HR 63 | Temp 97.2°F | Resp 16 | Wt 260.6 lb

## 2020-10-30 DIAGNOSIS — R42 Dizziness and giddiness: Secondary | ICD-10-CM | POA: Insufficient documentation

## 2020-10-30 DIAGNOSIS — Z86711 Personal history of pulmonary embolism: Secondary | ICD-10-CM

## 2020-10-30 DIAGNOSIS — Z7901 Long term (current) use of anticoagulants: Secondary | ICD-10-CM | POA: Diagnosis not present

## 2020-10-30 DIAGNOSIS — J8489 Other specified interstitial pulmonary diseases: Secondary | ICD-10-CM | POA: Insufficient documentation

## 2020-10-30 DIAGNOSIS — Z86718 Personal history of other venous thrombosis and embolism: Secondary | ICD-10-CM | POA: Insufficient documentation

## 2020-10-30 DIAGNOSIS — D6851 Activated protein C resistance: Secondary | ICD-10-CM

## 2020-10-30 DIAGNOSIS — Z800 Family history of malignant neoplasm of digestive organs: Secondary | ICD-10-CM | POA: Diagnosis not present

## 2020-10-30 LAB — CBC WITH DIFFERENTIAL/PLATELET
Abs Immature Granulocytes: 0.01 10*3/uL (ref 0.00–0.07)
Basophils Absolute: 0.1 10*3/uL (ref 0.0–0.1)
Basophils Relative: 1 %
Eosinophils Absolute: 0.2 10*3/uL (ref 0.0–0.5)
Eosinophils Relative: 4 %
HCT: 38.2 % (ref 36.0–46.0)
Hemoglobin: 13.3 g/dL (ref 12.0–15.0)
Immature Granulocytes: 0 %
Lymphocytes Relative: 23 %
Lymphs Abs: 1 10*3/uL (ref 0.7–4.0)
MCH: 32 pg (ref 26.0–34.0)
MCHC: 34.8 g/dL (ref 30.0–36.0)
MCV: 91.8 fL (ref 80.0–100.0)
Monocytes Absolute: 0.4 10*3/uL (ref 0.1–1.0)
Monocytes Relative: 9 %
Neutro Abs: 2.8 10*3/uL (ref 1.7–7.7)
Neutrophils Relative %: 63 %
Platelets: 246 10*3/uL (ref 150–400)
RBC: 4.16 MIL/uL (ref 3.87–5.11)
RDW: 13.3 % (ref 11.5–15.5)
WBC: 4.4 10*3/uL (ref 4.0–10.5)
nRBC: 0 % (ref 0.0–0.2)

## 2020-10-30 LAB — COMPREHENSIVE METABOLIC PANEL
ALT: 24 U/L (ref 0–44)
AST: 25 U/L (ref 15–41)
Albumin: 4.2 g/dL (ref 3.5–5.0)
Alkaline Phosphatase: 46 U/L (ref 38–126)
Anion gap: 9 (ref 5–15)
BUN: 12 mg/dL (ref 6–20)
CO2: 24 mmol/L (ref 22–32)
Calcium: 8.6 mg/dL — ABNORMAL LOW (ref 8.9–10.3)
Chloride: 103 mmol/L (ref 98–111)
Creatinine, Ser: 0.93 mg/dL (ref 0.44–1.00)
GFR, Estimated: >60 mL/min (ref >60)
Glucose, Bld: 111 mg/dL — ABNORMAL HIGH (ref 70–99)
Potassium: 3.7 mmol/L (ref 3.5–5.1)
Sodium: 136 mmol/L (ref 135–145)
Total Bilirubin: 0.7 mg/dL (ref 0.3–1.2)
Total Protein: 7 g/dL (ref 6.5–8.1)

## 2020-10-30 MED ORDER — ENOXAPARIN SODIUM 40 MG/0.4ML IJ SOSY: 40.0000 mg | PREFILLED_SYRINGE | INTRAMUSCULAR | 5 refills | Status: DC

## 2020-10-30 NOTE — Progress Notes (Signed)
Hematology/Oncology follow-up [place]  Patient Care Team: Adin Hector, MD as PCP - General (Internal Medicine) Earlie Server, MD as Consulting Physician (Hematology and Oncology)  Reason for visit Follow-up for PE and chronic anticoagulation.  Referring Physician: Dr.Fleming Herbon.  HISTORY OF PRESENTING ILLNESS:  Pamela Mills 40 y.o.  female with past medical history listed as below who  follow-up for management of anticoagulation due to recent PE. Since  November 2017, patient started to experience persistent dry cough, exertional shortness of breath, progressive swelling in hands and feet, she was referred to see rheumatology. Workup showed seropositive rheumatoid arthritis, Negative anti-CCP but elevated RF Patient was  Prednisone and later started on methotrexate weekly (started on 08/15/2016) for treatment.. Patient's respiratory symptoms improved and then worsen again. At that point she was referred by her primary care to see Dr. Raul Del pulmonary for evaluation. CT without contrast on 07/30/2016 showed basilar and apparent from predominant ground glass opacities in the architectural distortion, with possible minimum lower lobe bronchiectasis suggesting interstitial lung disease. There is also mild the bilateral axillary, subpectoral, supraclavicular, mediastinal adenopathy.lymph nodes are increased in number, mildly enlarged. Largest left axillary measuring 11 mm short axis. Findings favor reactive/systemic process with autoimmune disorders.  Patient's rheumatology symptoms improved with treatment. Patient had wax and wane of her respiratory symptoms. In the summer, she has had 2 round trips of 8 hour car ride back and forth to [place] where her father lives. Most recent one was on August 5. She has felt lower extremity swelling for which she has to put on stocking to reduce swelling. Also she felt a little stretchy feeling on her cough. She mentioned to Dr. Raul Del  who ordered a follow-up CT scan. CT without contrast chest on 11/26/2016 showed patchy groundglass in the septal thickening in both lower lobes, less severe than 07/27/2016. There is new rounded consolidation in the left lower lobe, possible infectious etiology. No definitive evidence of interstitial lung disease. Meanwhile her respiratory symptoms has become worse. She has experienced coughing, exertional shortness of breath, and mild hemoptysis. This triggered a d-dimer testing which came back high. Patient had CT anginal chest PE protocol on 11/29/2016 which showed acute appearing nonconclusive pulmonary emboli within several segmental pulmonary artery branches to the left lower lobe. Small pleural-based consolidation with the posterior aspect of the left lower lobe presumably associated focal pulmonary infarction. Associated small left pleural effusion. No evidence of associated right heart straining. Questionable focal low-density pulmonary embolus within the segmental pulmonary artery branch to the right middle lobe.   lower extremity ultrasound revealed no DVT within the right lower extremity. Patient was started on Eliquis 5 mg twice a day.  Patient has had some rheumatology/hypercoagulable workup done with [facility]. Reviewing her labs showed on 08/08/2016, she is tested negative for anti-Cardiolite pain antibody IgG < 9, IgM slightly elevated at 15. Beta-2 glycoprotein IgG and IgM normal, normal axial coronal phase phospholipid, DVvT normal, negative lupus anticoagulant.. Serum protein electrophoresis negative for M spike.  11/29/2016, homocystine 8, lupus anticoagulant was retested was negative, mildly low protein S activity at 48%, normal protein S antigen. Slightly low protein S antigen. Normal anti-thrombin function. She is tested positive for single R506Q mutation heterozygous factor V Leiden mutation.  Patient has a family history of ovarian cancer in her mom. She reports that she was  tested negative for genetic test.  She takes OCP since she was teenage.  This is her fist episode of blood  clot. She was taking of OCP and referred to see gynecology.  # 06/13/2017 Antiphospholipid panel was checked at last visit again.  Which showed negative panel except slightly elevated anti cardiolipin IgM at 15.  Repeat protein S activity was 75%, protein S antigen at 52%, mildly decreased.  slight positive anti cardiolipin IgM does not meet criteria for Antiphospholipid syndrome 02/02/18 repeat protein antigen was normal at 62%.  08/04/2018, decrease to Eliquis 2.5 mg twice daily. 04/14/2020, switched to Xarelto 10 mg daily due to lack of insurance coverage for Eliquis  NSIP (nonspecific interstitial pneumonitis)-patient follows up with rheumatologist and was  treated with CellCept and sulfasalazine.  INTERVAL HISTORY Pamela Mills is a 40 y.o. female who has above history reviewed by me today presents for follow-up management of pulmonary embolism.  Patient is planning to conceive.  Currently on Humira, azathioprine. She is on Lovenox 40 mg subcutaneous daily She was initially doing well in the clinic. Felt lightheaded.  She reports not drinking enough liquids this morning.  Denies any shortness of breath or chest pain.   ROS:  Review of Systems  Constitutional:  Negative for appetite change, chills, fatigue and fever.  HENT:   Negative for hearing loss and voice change.   Eyes:  Negative for eye problems.  Respiratory:  Negative for chest tightness and cough.   Cardiovascular:  Negative for chest pain.  Gastrointestinal:  Negative for abdominal distention, abdominal pain and blood in stool.  Endocrine: Negative for hot flashes.  Genitourinary:  Negative for difficulty urinating and frequency.   Musculoskeletal:  Negative for arthralgias.  Skin:  Negative for itching and rash.  Neurological:  Positive for headaches and light-headedness. Negative for extremity weakness.   Hematological:  Negative for adenopathy.  Psychiatric/Behavioral:  Negative for confusion.    MEDICAL HISTORY:  Past Medical History:  Diagnosis Date   Anxiety    Arthritis    Basal cell carcinoma    Post neck - treated years ago    Cancer ([place])    skin   Collagen vascular disease ([place])    Depression    Diverticulitis    Dysplastic nevus 05/27/2019   R distal ant thigh - moderate   Factor 5 Leiden mutation, heterozygous ([place]) 2018   GERD (gastroesophageal reflux disease)    Migraines    Preterm labor    Pulmonary embolism (H[facility])    Vaginal Pap smear, abnormal     SURGICAL HISTORY: Past Surgical History:  Procedure Laterality Date   CHOLECYSTECTOMY N/A 06/29/2015   Procedure: LAPAROSCOPIC CHOLECYSTECTOMY;  Surgeon: Stark Klein, MD;  Location: [place];  Service: General;  Laterality: N/A;   CHROMOPERTUBATION Right 10/17/2017   Procedure: CHROMOPERTUBATION;  Surgeon: Ward, Honor Loh, MD;  Location: [facility];  Service: Gynecology;  Laterality: Right;   DILATION AND CURETTAGE OF UTERUS  2016   DILATION AND EVACUATION N/A 11/10/2014   Procedure: DILATATION AND EVACUATION;  Surgeon: Honor Loh Ward, MD;  Location: [facility];  Service: Gynecology;  Laterality: N/A;   FLEXIBLE BRONCHOSCOPY N/A 02/18/2018   Procedure: FLEXIBLE BRONCHOSCOPY;  Surgeon: Ottie Glazier, MD;  Location: [facility];  Service: Thoracic;  Laterality: N/A;   FOOT SURGERY Left 01/2012   FOOT SURGERY Left 2013   Plantar Fasiciatis   LAPAROSCOPIC ENDOMETRIOSIS FULGURATION  2006   LAPAROSCOPIC UNILATERAL SALPINGO OOPHERECTOMY Left 10/17/2017   Procedure: LAPAROSCOPIC UNILATERAL SALPINGO OOPHORECTOMY;  Surgeon: Ward, Honor Loh, MD;  Location: [facility];  Service: Gynecology;  Laterality: Left;   LAPAROSCOPY  N/A 10/17/2017   Procedure: LAPAROSCOPY OPERATIVE, Raeanne Gathers;  Surgeon: Ward, Honor Loh, MD;  Location: [facility];  Service: Gynecology;  Laterality: N/A;   oophotectomy Left 09/2017   2  cysts on the same side also removed   UPPER GI ENDOSCOPY      SOCIAL HISTORY: Social History   Socioeconomic History   Marital status: Married    Spouse name: Ricky   Number of children: Not on file   Years of education: Not on file   Highest education level: Not on file  Occupational History   Not on file  Tobacco Use   Smoking status: Never   Smokeless tobacco: Never  Vaping Use   Vaping Use: Never used  Substance and Sexual Activity   Alcohol use: No   Drug use: No   Sexual activity: Not Currently  Other Topics Concern   Not on file  Social History Narrative   Not on file   Social Determinants of Health   Financial Resource Strain: Not on file  Food Insecurity: Not on file  Transportation Needs: Not on file  Physical Activity: Not on file  Stress: Not on file  Social Connections: Not on file  Intimate Partner Violence: Not on file    FAMILY HISTORY: Family History  Problem Relation Age of Onset   Ovarian cancer Mother 25   Rheumatologic disease Maternal Aunt    Pancreatic cancer Maternal Grandfather    Colon cancer Paternal Grandmother    Breast cancer Neg Hx     ALLERGIES:  is allergic to other and gluten meal.  MEDICATIONS:  Current Outpatient Medications  Medication Sig Dispense Refill   acetaminophen (TYLENOL) 500 MG tablet Take 1,000 mg by mouth every 8 (eight) hours as needed (pain).     azathioprine (IMURAN) 100 MG tablet Take 100 mg by mouth in the morning and at bedtime.     cetirizine (ZYRTEC) 10 MG tablet Take 10 mg by mouth daily.     Cholecalciferol (VITAMIN D3) 1000 units CAPS Take 1,000 Units by mouth daily.     escitalopram (LEXAPRO) 5 MG tablet Take 15 mg by mouth every evening.      folic acid (FOLVITE) 1 MG tablet Take 1 mg by mouth daily.  11   Galcanezumab-gnlm 120 MG/ML SOAJ Inject 120 mg into the skin every 28 (twenty-eight) days.     HUMIRA PEN 40 MG/0.4ML PNKT SMARTSIG:40 Milligram(s) SUB-Q Every 2 Weeks     LORazepam (ATIVAN)  0.5 MG tablet Take 0.5 mg by mouth 2 (two) times daily as needed for anxiety.      omeprazole (PRILOSEC) 20 MG capsule Take 20 mg by mouth daily.      SUMAtriptan (IMITREX) 50 MG tablet Take 50 mg by mouth every 2 (two) hours as needed for migraine.      topiramate (TOPAMAX) 25 MG tablet Take 50 mg by mouth daily.      triamcinolone (NASACORT) 55 MCG/ACT AERO nasal inhaler Place 2 sprays into the nose daily.     vitamin B-12 (CYANOCOBALAMIN) 1000 MCG tablet Take 1,000 mcg by mouth daily.     enoxaparin (LOVENOX) 40 MG/0.4ML injection Inject 0.4 mLs (40 mg total) into the skin daily. 12 mL 5   No current facility-administered medications for this visit.      Marland Kitchen  PHYSICAL EXAMINATION: ECOG PERFORMANCE STATUS: 1 - Symptomatic but completely ambulatory Vitals:   10/30/20 1401 10/30/20 1410  BP: 117/81 123/80  Pulse: 68 63  Resp:  Temp:    SpO2:     Filed Weights   10/30/20 1321  Weight: 260 lb 9.6 oz (118.2 kg)   Physical Exam Constitutional:      General: She is not in acute distress.    Appearance: She is not diaphoretic.  HENT:     Head: Normocephalic and atraumatic.     Nose: Nose normal.     Mouth/Throat:     Pharynx: No oropharyngeal exudate.  Eyes:     General: No scleral icterus.       Left eye: No discharge.     Conjunctiva/sclera: Conjunctivae normal.     Pupils: Pupils are equal, round, and reactive to light.  Cardiovascular:     Rate and Rhythm: Normal rate and regular rhythm.     Heart sounds: Normal heart sounds. No murmur heard. Pulmonary:     Effort: Pulmonary effort is normal. No respiratory distress.     Breath sounds: Normal breath sounds. No wheezing or rales.  Chest:     Chest wall: No tenderness.  Abdominal:     General: Bowel sounds are normal. There is no distension.     Palpations: Abdomen is soft. There is no mass.     Tenderness: There is no abdominal tenderness. There is no rebound.  Musculoskeletal:        General: Normal range of  motion.     Cervical back: Normal range of motion and neck supple.  Lymphadenopathy:     Cervical: No cervical adenopathy.  Skin:    General: Skin is warm and dry.     Findings: No erythema.  Neurological:     Mental Status: She is alert and oriented to person, place, and time.     Cranial Nerves: No cranial nerve deficit.     Motor: No abnormal muscle tone.     Coordination: Coordination normal.  Psychiatric:        Mood and Affect: Affect normal.      RADIOGRAPHIC STUDIES: I have personally reviewed the radiological images as listed and agreed with the findings in the report. No results found.     ASSESSMENT & PLAN:  1. Heterozygous factor V Leiden mutation ([place])   2. History of pulmonary embolism   3. NSIP (nonspecific interstitial pneumonitis) ([place])   4. Chronic anticoagulation    #History of pulmonary embolism in the context of OCP use, interstitial lung disease, heterozygous factor V Leiden mutation Continue Lovenox 40 mg daily prophylaxis.  # NSIP (nonspecific interstitial pneumonitis)-patient follows up with rheumatologist and currently on azathioprine and Humira  #Family planning, patient plans to conceive in the near future.  Continue prophylactic Lovenox . #Lightheadedness, probably due to dehydration.  Patient was provided oral hydration and her symptoms slightly improved.  Vital signs are measured again and was stable.  Patient was recommended to discuss with rheumatology to see if this could be rare cardiovascular side effects secondary to her medications. Follow up in 6 months Orders Placed This Encounter  Procedures   CBC with Differential/Platelet    Standing Status:   Future    Standing Expiration Date:   10/30/2021   Comprehensive metabolic panel    Standing Status:   Future    Standing Expiration Date:   10/30/2021    Earlie Server, MD, PhD Hematology Oncology [place] at [facility] Pager-, 10/30/2020

## 2020-10-30 NOTE — Progress Notes (Signed)
Patient denies new problems/concerns today.   °

## 2021-04-28 ENCOUNTER — Other Ambulatory Visit: Payer: Self-pay | Admitting: Oncology

## 2021-05-02 ENCOUNTER — Inpatient Hospital Stay (HOSPITAL_BASED_OUTPATIENT_CLINIC_OR_DEPARTMENT_OTHER): Payer: BC Managed Care – PPO | Admitting: Oncology

## 2021-05-02 ENCOUNTER — Inpatient Hospital Stay: Payer: BC Managed Care – PPO | Attending: Oncology

## 2021-05-02 ENCOUNTER — Other Ambulatory Visit: Payer: Self-pay

## 2021-05-02 ENCOUNTER — Encounter: Payer: Self-pay | Admitting: Oncology

## 2021-05-02 VITALS — BP 135/89 | HR 71 | Temp 97.6°F | Resp 16 | Wt 261.0 lb

## 2021-05-02 DIAGNOSIS — D6851 Activated protein C resistance: Secondary | ICD-10-CM | POA: Insufficient documentation

## 2021-05-02 DIAGNOSIS — D72810 Lymphocytopenia: Secondary | ICD-10-CM | POA: Insufficient documentation

## 2021-05-02 DIAGNOSIS — Z800 Family history of malignant neoplasm of digestive organs: Secondary | ICD-10-CM | POA: Insufficient documentation

## 2021-05-02 DIAGNOSIS — J90 Pleural effusion, not elsewhere classified: Secondary | ICD-10-CM | POA: Diagnosis not present

## 2021-05-02 DIAGNOSIS — J8489 Other specified interstitial pulmonary diseases: Secondary | ICD-10-CM | POA: Diagnosis not present

## 2021-05-02 DIAGNOSIS — Z86711 Personal history of pulmonary embolism: Secondary | ICD-10-CM | POA: Diagnosis not present

## 2021-05-02 DIAGNOSIS — Z7901 Long term (current) use of anticoagulants: Secondary | ICD-10-CM | POA: Insufficient documentation

## 2021-05-02 DIAGNOSIS — I2699 Other pulmonary embolism without acute cor pulmonale: Secondary | ICD-10-CM | POA: Diagnosis present

## 2021-05-02 DIAGNOSIS — Z8041 Family history of malignant neoplasm of ovary: Secondary | ICD-10-CM | POA: Diagnosis not present

## 2021-05-02 LAB — CBC WITH DIFFERENTIAL/PLATELET
Abs Immature Granulocytes: 0.01 10*3/uL (ref 0.00–0.07)
Basophils Absolute: 0 10*3/uL (ref 0.0–0.1)
Basophils Relative: 1 %
Eosinophils Absolute: 0.1 10*3/uL (ref 0.0–0.5)
Eosinophils Relative: 3 %
HCT: 38.6 % (ref 36.0–46.0)
Hemoglobin: 13.4 g/dL (ref 12.0–15.0)
Immature Granulocytes: 0 %
Lymphocytes Relative: 15 %
Lymphs Abs: 0.6 10*3/uL — ABNORMAL LOW (ref 0.7–4.0)
MCH: 31.4 pg (ref 26.0–34.0)
MCHC: 34.7 g/dL (ref 30.0–36.0)
MCV: 90.4 fL (ref 80.0–100.0)
Monocytes Absolute: 0.4 10*3/uL (ref 0.1–1.0)
Monocytes Relative: 10 %
Neutro Abs: 2.7 10*3/uL (ref 1.7–7.7)
Neutrophils Relative %: 71 %
Platelets: 212 10*3/uL (ref 150–400)
RBC: 4.27 MIL/uL (ref 3.87–5.11)
RDW: 13.7 % (ref 11.5–15.5)
WBC: 3.9 10*3/uL — ABNORMAL LOW (ref 4.0–10.5)
nRBC: 0 % (ref 0.0–0.2)

## 2021-05-02 LAB — COMPREHENSIVE METABOLIC PANEL
ALT: 24 U/L (ref 0–44)
AST: 24 U/L (ref 15–41)
Albumin: 4 g/dL (ref 3.5–5.0)
Alkaline Phosphatase: 39 U/L (ref 38–126)
Anion gap: 6 (ref 5–15)
BUN: 13 mg/dL (ref 6–20)
CO2: 25 mmol/L (ref 22–32)
Calcium: 9.1 mg/dL (ref 8.9–10.3)
Chloride: 106 mmol/L (ref 98–111)
Creatinine, Ser: 0.78 mg/dL (ref 0.44–1.00)
GFR, Estimated: >60 mL/min (ref >60)
Glucose, Bld: 98 mg/dL (ref 70–99)
Potassium: 4.1 mmol/L (ref 3.5–5.1)
Sodium: 137 mmol/L (ref 135–145)
Total Bilirubin: 0.6 mg/dL (ref 0.3–1.2)
Total Protein: 7.1 g/dL (ref 6.5–8.1)

## 2021-05-02 NOTE — Progress Notes (Signed)
Hematology/Oncology Progress note [phone] [phone]     Patient Care Team: Pamela Hector, MD as PCP - General (Internal Medicine) Pamela Server, MD as Consulting Physician (Hematology and Oncology)  Reason for visit Follow-up for PE and chronic anticoagulation.  Referring Physician: Dr.Fle[place].  HISTORY OF PRESENTING ILLNESS:  P[place]ela [place] 41 y.o.  female with past medical history listed as below who  follow-up for unprovoked pulmonary embolism and  anticoagulation. Since  November 2017, patient started to experience persistent dry cough, exertional shortness of breath, progressive swelling in hands and feet, she was referred to see rheumatology. Workup showed seropositive rheumatoid arthritis, Negative anti-CCP but elevated RF Patient was  initially prednisone and later started on methotrexate weekly (started on 08/15/2016) for treatment.. Patient's respiratory symptoms improved and then worsen again. At that point she was referred by her primary care to see Dr. Raul [place] pulmonary for evaluation. CT without contrast on 07/30/2016 showed basilar and apparent from predominant ground glass opacities in the architectural distortion, with possible minimum lower lobe bronchiectasis suggesting interstitial lung disease. There is also mild the bilateral axillary, subpectoral, supraclavicular, mediastinal adenopathy.lymph nodes are increased in number, mildly enlarged. Largest left axillary measuring 11 mm short axis. Findings favor reactive/systemic process with autoimmune disorders.  Patient's rheumatology symptoms improved with treatment. Patient had wax and wane of her respiratory symptoms. In the summer, she has had 2 round trips of 8 hour car ride back and forth to [place] where her father lives. Most recent one was on August 5. She has felt lower extremity swelling for which she has to put on stocking to reduce swelling. Also she felt a little stretchy feeling on her  cough. She mentioned to Dr. Raul [place] who ordered a follow-up CT scan. CT without contrast chest on 11/26/2016 showed patchy groundglass in the septal thickening in both lower lobes, less severe than 07/27/2016. There is new rounded consolidation in the left lower lobe, possible infectious etiology. No definitive evidence of interstitial lung disease. Meanwhile her respiratory symptoms has become worse. She has experienced coughing, exertional shortness of breath, and mild hemoptysis. This triggered a d-dimer testing which came back high. Patient had CT anginal chest PE protocol on 11/29/2016 which showed acute appearing nonconclusive pulmonary emboli within several segmental pulmonary artery branches to the left lower lobe. Small pleural-based consolidation with the posterior aspect of the left lower lobe presumably associated focal pulmonary infarction. Associated small left pleural effusion. No evidence of associated right heart straining. Questionable focal low-density pulmonary embolus within the segmental pulmonary artery branch to the right middle lobe.  lower extremity ultrasound revealed no DVT within the right lower extremity. Patient was started on Eliquis 5 mg twice a day.  Patient has had some rheumatology/hypercoagulable workup done with [facility]. Reviewing her labs showed on 08/08/2016, she is tested negative for anti-Cardiolite pain antibody IgG < 9, IgM slightly elevated at 15. Beta-2 glycoprotein IgG and IgM normal, normal axial coronal phase phospholipid, DVvT normal, negative lupus anticoagulant.. Serum protein electrophoresis negative for M spike.  11/29/2016, homocystine 8, lupus anticoagulant was retested was negative, mildly low protein S activity at 48%, normal protein S antigen. Slightly low protein S antigen. Normal anti-thrombin function. She is tested positive for single R506Q mutation heterozygous factor V Leiden mutation.  Patient has a family history of ovarian cancer in  her mom. She reports that she was tested negative for genetic test.  She takes OCP since she was teenage.  This is her fist  episode of blood clot. She was taking of OCP and referred to see gynecology.  # 06/13/2017 Antiphospholipid panel was checked at last visit again.  Which showed negative panel except slightly elevated anti cardiolipin IgM at 15.  Repeat protein S activity was 75%, protein S antigen at 52%, mildly decreased.  slight positive anti cardiolipin IgM does not meet criteria for Antiphospholipid syndrome 02/02/18 repeat protein antigen was normal at 62%.  08/04/2018, decrease to Eliquis 2.5 mg twice daily. 04/14/2020, switched to Xarelto 10 mg daily due to lack of insurance coverage for Eliquis July/August 2022 Switched to Lovenox 37m daily with pregnancy planning     NSIP (nonspecific interstitial pneumonitis)-patient follows up with rheumatologist and was  treated with CellCept and sulfasalazine.  INTERVAL HISTORY [name] a 41y.o. female who has above history reviewed by me today presents for follow-up management of pulmonary embolism.  She is on Lovenox 40 mg subcutaneous daily Patient has not been able to successfully conceive and [facility] Her next appointment is in October 2023. Patient reports feeling well today. Denies any shortness of breath or chest pain.   ROS:  Review of Systems  Constitutional:  Negative for appetite change, chills, fatigue and fever.  HENT:   Negative for hearing loss and voice change.   Eyes:  Negative for eye problems.  Respiratory:  Negative for chest tightness and cough.   Cardiovascular:  Negative for chest pain.  Gastrointestinal:  Negative for abdominal distention, abdominal pain and blood in stool.  Endocrine: Negative for hot flashes.  Genitourinary:  Negative for difficulty urinating and frequency.   Musculoskeletal:  Negative for arthralgias.  Skin:  Negative  for itching and rash.  Neurological:  Negative for extremity weakness, headaches and light-headedness.  Hematological:  Negative for adenopathy.  Psychiatric/Behavioral:  Negative for confusion.    MEDICAL HISTORY:  Past Medical History:  Diagnosis Date   Anxiety    Arthritis    Basal cell carcinoma    Post neck - treated years ago    Cancer ([facility]    skin   Collagen vascular disease ([facility]    Depression    Diverticulitis    Dysplastic nevus 05/27/2019   R distal ant thigh - moderate   Factor 5 Leiden mutation, heterozygous ([facility] 2018   GERD (gastroesophageal reflux disease)    Migraines    Preterm labor    Pulmonary embolism (HCC)    Vaginal Pap smear, abnormal     SURGICAL HISTORY: Past Surgical History:  Procedure Laterality Date   CHOLECYSTECTOMY N/A 06/29/2015   Procedure: LAPAROSCOPIC CHOLECYSTECTOMY;  Surgeon: FStark Klein MD;  [place]  Service: General;  Laterality: N/A;   CHROMOPERTUBATION Right 10/17/2017   Procedure: CHROMOPERTUBATION;  Surgeon: Ward, CHonor Loh MD;  Location: [facility];  Service: Gynecology;  Laterality: Right;   DILATION AND CURETTAGE OF UTERUS  2016   DILATION AND EVACUATION N/A 11/10/2014   Procedure: DILATATION AND EVACUATION;  Surgeon: CHonor LohWard, MD;  Location: [facility];  Service: Gynecology;  Laterality: N/A;   FLEXIBLE BRONCHOSCOPY N/A 02/18/2018   Procedure: FLEXIBLE BRONCHOSCOPY;  Surgeon: AOttie Glazier MD;  Location: [facility];  Service: Thoracic;  Laterality: N/A;   FOOT SURGERY Left 01/2012   FOOT SURGERY Left 2013   Plantar Fasiciatis   LAPAROSCOPIC ENDOMETRIOSIS FULGURATION  2006   LAPAROSCOPIC UNILATERAL SALPINGO OOPHERECTOMY Left 10/17/2017   Procedure: LAPAROSCOPIC UNILATERAL SALPINGO OOPHORECTOMY;  Surgeon: Ward,  Honor Loh, MD;  Location: [facility];  Service: Gynecology;  Laterality: Left;   LAPAROSCOPY N/A 10/17/2017   Procedure: LAPAROSCOPY OPERATIVE, Raeanne Gathers;  Surgeon: Ward, Honor Loh, MD;  Location: [facility];  Service: Gynecology;  Laterality: N/A;   oophotectomy Left 09/2017   2 cysts on the same side also removed   UPPER GI ENDOSCOPY      SOCIAL HISTORY: Social History   Socioeconomic History   Marital status: Married    Spouse name: Ricky   Number of children: Not on file   Years of education: Not on file   Highest education level: Not on file  Occupational History   Not on file  Tobacco Use   Smoking status: Never   Smokeless tobacco: Never  Vaping Use   Vaping Use: Never used  Substance and Sexual Activity   Alcohol use: No   Drug use: No   Sexual activity: Not Currently  Other Topics Concern   Not on file  Social History Narrative   Not on file   Social Determinants of Health   Financial Resource Strain: Not on file  Food Insecurity: Not on file  Transportation Needs: Not on file  Physical Activity: Not on file  Stress: Not on file  Social Connections: Not on file  Intimate Partner Violence: Not on file    FAMILY HISTORY: Family History  Problem Relation Age of Onset   Ovarian cancer Mother 74   Rheumatologic disease Maternal Aunt    Pancreatic cancer Maternal Grandfather    Colon cancer Paternal Grandmother    Breast cancer Neg Hx     ALLERGIES:  is allergic to other and gluten meal.  MEDICATIONS:  Current Outpatient Medications  Medication Sig Dispense Refill   acetaminophen (TYLENOL) 500 MG tablet Take 1,000 mg by mouth every 8 (eight) hours as needed (pain).     cetirizine (ZYRTEC) 10 MG tablet Take 10 mg by mouth daily.     Cholecalciferol (VITAMIN D3) 1000 units CAPS Take 1,000 Units by mouth daily.     escitalopram (LEXAPRO) 5 MG tablet Take 15 mg by mouth every evening.      folic acid (FOLVITE) 1 MG tablet Take 1 mg by mouth daily.  11   HUMIRA PEN 40 MG/0.4ML PNKT SMARTSIG:40 Milligram(s) SUB-Q Every 2 Weeks     omeprazole (PRILOSEC) 20 MG capsule Take 20 mg by mouth daily.      triamcinolone (NASACORT) 55 MCG/ACT  AERO nasal inhaler Place 2 sprays into the nose daily.     vitamin B-12 (CYANOCOBALAMIN) 1000 MCG tablet Take 1,000 mcg by mouth daily.     Azathioprine 75 MG TABS Take 1 tablet by mouth 2 (two) times daily.     enoxaparin (LOVENOX) 40 MG/0.4ML injection INJECT 0.4 MLS (40 MG TOTAL) INTO THE SKIN DAILY. 12 mL 5   Galcanezumab-gnlm 120 MG/ML SOAJ Inject 120 mg into the skin every 28 (twenty-eight) days. (Patient not taking: Reported on 05/02/2021)     LORazepam (ATIVAN) 0.5 MG tablet Take 0.5 mg by mouth 2 (two) times daily as needed for anxiety.  (Patient not taking: Reported on 05/02/2021)     SUMAtriptan (IMITREX) 50 MG tablet Take 50 mg by mouth every 2 (two) hours as needed for migraine.  (Patient not taking: Reported on 05/02/2021)     topiramate (TOPAMAX) 25 MG tablet Take 50 mg by mouth daily.  (Patient not taking: Reported on 05/02/2021)     No current facility-administered medications for this visit.      Marland Kitchen  PHYSICAL EXAMINATION: ECOG PERFORMANCE STATUS: 1 - Symptomatic but completely ambulatory Vitals:   05/02/21 1327  BP: 135/89  Pulse: 71  Resp: 16  Temp: 97.6 F (36.4 C)  SpO2: 99%   Filed Weights   05/02/21 1327  Weight: 261 lb (118.4 kg)   Physical Exam Constitutional:      General: She is not in acute distress.    Appearance: She is obese. She is not diaphoretic.  HENT:     Head: Normocephalic and atraumatic.     Nose: Nose normal.     Mouth/Throat:     Pharynx: No oropharyngeal exudate.  Eyes:     General: No scleral icterus.       Left eye: No discharge.     Conjunctiva/sclera: Conjunctivae normal.     Pupils: Pupils are equal, round, and reactive to light.  Cardiovascular:     Rate and Rhythm: Normal rate and regular rhythm.     Heart sounds: Normal heart sounds. No murmur heard. Pulmonary:     Effort: Pulmonary effort is normal. No respiratory distress.     Breath sounds: Normal breath sounds. No wheezing or rales.  Chest:     Chest wall: No tenderness.   Abdominal:     General: Bowel sounds are normal. There is no distension.     Palpations: Abdomen is soft. There is no mass.     Tenderness: There is no abdominal tenderness. There is no rebound.  Musculoskeletal:        General: Normal range of motion.     Cervical back: Normal range of motion and neck supple.  Lymphadenopathy:     Cervical: No cervical adenopathy.  Skin:    General: Skin is warm and dry.     Findings: No erythema.  Neurological:     Mental Status: She is alert and oriented to person, place, and time.     Cranial Nerves: No cranial nerve deficit.     Motor: No abnormal muscle tone.     Coordination: Coordination normal.  Psychiatric:        Mood and Affect: Affect normal.      RADIOGRAPHIC STUDIES: I have personally reviewed the radiological images as listed and agreed with the findings in the report. No results found.     ASSESSMENT & PLAN:  1. History of pulmonary embolism   2. Heterozygous factor V Leiden mutation ([place])   3. NSIP (nonspecific interstitial pneumonitis) (HCC)    #History of pulmonary embolism in the context of OCP use, interstitial lung disease, heterozygous factor V Leiden mutation Pregnancy planning. Continue Lovenox 40 mg daily for prophylaxis. Prescription refills were sent. To establish care with [facility].  # NSIP (nonspecific interstitial pneumonitis)-patient follows up with rheumatologist #Lymphocytopenia, ALC 0.6.  Likely secondary to azathioprine.  Follow up in 6 months Orders Placed This Encounter  Procedures   CBC with Differential/Platelet    Standing Status:   Future    Standing Expiration Date:   05/02/2022   Comprehensive metabolic panel    Standing Status:   Future    Standing Expiration Date:   05/02/2022    [place] Server, MD, PhD Hematology Oncology 05/02/2021

## 2021-05-03 ENCOUNTER — Telehealth: Payer: Self-pay

## 2021-05-03 NOTE — Telephone Encounter (Signed)
Please adjust appts as follows and inform pt of update:   Lab/MD last week of July (same day)

## 2021-05-03 NOTE — Telephone Encounter (Signed)
-----   Message from Earlie Server, MD sent at 05/02/2021  8:35 PM EST ----- Sorry there may be a miscommunication.  She has 6 months supply of Lovenox and I want to see him 1-2 weeks before 6 month timepoint so that she will not run out of her supply.  Please adjust her appt to last week of July, lab md same day. Thanks.

## 2021-05-28 ENCOUNTER — Encounter: Payer: Self-pay | Admitting: Oncology

## 2021-05-29 ENCOUNTER — Other Ambulatory Visit: Payer: Self-pay | Admitting: Oncology

## 2021-05-29 MED ORDER — RIVAROXABAN 10 MG PO TABS: 10.0000 mg | ORAL_TABLET | Freq: Every day | ORAL | 1 refills | Status: DC

## 2021-05-30 ENCOUNTER — Other Ambulatory Visit: Payer: Self-pay

## 2021-05-30 ENCOUNTER — Ambulatory Visit: Payer: BC Managed Care – PPO | Admitting: Dermatology

## 2021-05-30 DIAGNOSIS — D229 Melanocytic nevi, unspecified: Secondary | ICD-10-CM

## 2021-05-30 DIAGNOSIS — I781 Nevus, non-neoplastic: Secondary | ICD-10-CM | POA: Diagnosis not present

## 2021-05-30 DIAGNOSIS — Z1283 Encounter for screening for malignant neoplasm of skin: Secondary | ICD-10-CM

## 2021-05-30 DIAGNOSIS — D1800 Hemangioma unspecified site: Secondary | ICD-10-CM

## 2021-05-30 DIAGNOSIS — L578 Other skin changes due to chronic exposure to nonionizing radiation: Secondary | ICD-10-CM

## 2021-05-30 DIAGNOSIS — L821 Other seborrheic keratosis: Secondary | ICD-10-CM

## 2021-05-30 DIAGNOSIS — L814 Other melanin hyperpigmentation: Secondary | ICD-10-CM

## 2021-05-30 DIAGNOSIS — Z85828 Personal history of other malignant neoplasm of skin: Secondary | ICD-10-CM

## 2021-05-30 DIAGNOSIS — L719 Rosacea, unspecified: Secondary | ICD-10-CM

## 2021-05-30 NOTE — Progress Notes (Signed)
? ?Follow-Up Visit ?  ?Subjective  ?[place] is a 41 y.o. female who presents for the following: Annual Exam (History of BCC and dysplastic nevus - TBSE today). ?The patient presents for Total-Body Skin Exam (TBSE) for skin cancer screening and mole check.  The patient has spots, moles and lesions to be evaluated, some may be new or changing and the patient has concerns that these could be cancer. ? ?The following portions of the chart were reviewed this encounter and updated as appropriate:  ? Tobacco  Allergies  Meds  Problems  Med Hx  Surg Hx  Fam Hx   ?  ?Review of Systems:  No other skin or systemic complaints except as noted in HPI or Assessment and Plan. ? ?Objective  ?Well appearing patient in no apparent distress; mood and affect are within normal limits. ? ?A full examination was performed including scalp, head, eyes, ears, nose, lips, neck, chest, axillae, abdomen, back, buttocks, bilateral upper extremities, bilateral lower extremities, hands, feet, fingers, toes, fingernails, and toenails. All findings within normal limits unless otherwise noted below. ? ?Face ?Dilated blood vessels ? ?Face ?Pinkness of cheeks ? ? ?Assessment & Plan  ? ?History of Basal Cell Carcinoma of the Skin ?- No evidence of recurrence today ?- Recommend regular full body skin exams ?- Recommend daily broad spectrum sunscreen SPF 30+ to sun-exposed areas, reapply every 2 hours as needed.  ?- Call if any new or changing lesions are noted between office visits ? ?Lentigines ?- Scattered tan macules ?- Due to sun exposure ?- Benign-appearing, observe ?- Recommend daily broad spectrum sunscreen SPF 30+ to sun-exposed areas, reapply every 2 hours as needed. ?- Call for any changes ? ?Seborrheic Keratoses ?- Stuck-on, waxy, tan-brown papules and/or plaques  ?- Benign-appearing ?- Discussed benign etiology and prognosis. ?- Observe ?- Call for any changes ? ?Melanocytic Nevi ?- Tan-brown and/or pink-flesh-colored symmetric  macules and papules ?- Benign appearing on exam today ?- Observation ?- Call clinic for new or changing moles ?- Recommend daily use of broad spectrum spf 30+ sunscreen to sun-exposed areas.  ? ?Hemangiomas ?- Red papules ?- Discussed benign nature ?- Observe ?- Call for any changes ? ?Actinic Damage ?- Chronic condition, secondary to cumulative UV/sun exposure ?- diffuse scaly erythematous macules with underlying dyspigmentation ?- Recommend daily broad spectrum sunscreen SPF 30+ to sun-exposed areas, reapply every 2 hours as needed.  ?- Staying in the shade or wearing long sleeves, sun glasses (UVA+UVB protection) and wide brim hats (4-inch brim around the entire circumference of the hat) are also recommended for sun protection.  ?- Call for new or changing lesions. ? ?Skin cancer screening performed today. ? ?Telangiectasias with rosacea ?Face ?Benign ?Discussed the treatment option of BBL/laser.  Typically we recommend 1-3 treatment sessions about 5-8 weeks apart for best results.  The patient's condition may require "maintenance treatments" in the future.  The fee for BBL / laser treatments is $350 per treatment session for the whole face.  A fee can be quoted for other parts of the body. ?Insurance typically does not pay for BBL/laser treatments and therefore the fee is an out-of-pocket cost. ? ?no Rx treatment today for rosacea due to not having any papules. May consider Skin Medicinals Triple Cream in the future if needed. ? ?Rosacea is a chronic progressive skin condition usually affecting the face of adults, causing redness and/or acne bumps. It is treatable but not curable. It sometimes affects the eyes (ocular rosacea) as well. It  may respond to topical and/or systemic medication and can flare with stress, sun exposure, alcohol, exercise and some foods.  Daily application of broad spectrum spf 30+ sunscreen to face is recommended to reduce flares. ? ?Skin cancer screening ? ?Return in about 1 year  (around 05/31/2022) for TBSE. ? ?I, Ashok Cordia, CMA, am acting as scribe for Sarina Ser, MD . ?Documentation: I have reviewed the above documentation for accuracy and completeness, and I agree with the above. ? ?Sarina Ser, MD ? ?

## 2021-05-30 NOTE — Patient Instructions (Signed)

## 2021-06-03 ENCOUNTER — Encounter: Payer: Self-pay | Admitting: Dermatology

## 2021-06-05 ENCOUNTER — Other Ambulatory Visit: Payer: Self-pay | Admitting: Internal Medicine

## 2021-06-05 DIAGNOSIS — Z1231 Encounter for screening mammogram for malignant neoplasm of breast: Secondary | ICD-10-CM

## 2021-07-10 ENCOUNTER — Ambulatory Visit
Admission: RE | Admit: 2021-07-10 | Discharge: 2021-07-10 | Disposition: A | Discharge status: Home / Self Care | Payer: BC Managed Care – PPO | Source: Ambulatory Visit | Attending: Internal Medicine | Admitting: Internal Medicine

## 2021-07-10 DIAGNOSIS — Z1231 Encounter for screening mammogram for malignant neoplasm of breast: Secondary | ICD-10-CM | POA: Insufficient documentation

## 2021-07-16 ENCOUNTER — Other Ambulatory Visit: Payer: Self-pay | Admitting: Internal Medicine

## 2021-07-16 DIAGNOSIS — R928 Other abnormal and inconclusive findings on diagnostic imaging of breast: Secondary | ICD-10-CM

## 2021-07-16 DIAGNOSIS — N6489 Other specified disorders of breast: Secondary | ICD-10-CM

## 2021-07-30 ENCOUNTER — Ambulatory Visit
Admission: RE | Admit: 2021-07-30 | Discharge: 2021-07-30 | Disposition: A | Discharge status: Home / Self Care | Payer: BC Managed Care – PPO | Source: Ambulatory Visit | Attending: Internal Medicine | Admitting: Internal Medicine

## 2021-07-30 DIAGNOSIS — R928 Other abnormal and inconclusive findings on diagnostic imaging of breast: Secondary | ICD-10-CM

## 2021-07-30 DIAGNOSIS — N6489 Other specified disorders of breast: Secondary | ICD-10-CM | POA: Insufficient documentation

## 2021-09-02 ENCOUNTER — Other Ambulatory Visit: Payer: Self-pay | Admitting: Oncology

## 2021-10-16 ENCOUNTER — Encounter: Payer: Self-pay | Admitting: Oncology

## 2021-10-16 ENCOUNTER — Inpatient Hospital Stay (HOSPITAL_BASED_OUTPATIENT_CLINIC_OR_DEPARTMENT_OTHER): Payer: BC Managed Care – PPO | Admitting: Oncology

## 2021-10-16 ENCOUNTER — Inpatient Hospital Stay: Payer: BC Managed Care – PPO | Attending: Oncology

## 2021-10-16 VITALS — BP 132/94 | HR 84 | Temp 97.8°F | Wt 244.0 lb

## 2021-10-16 DIAGNOSIS — Z86711 Personal history of pulmonary embolism: Secondary | ICD-10-CM | POA: Insufficient documentation

## 2021-10-16 DIAGNOSIS — J8489 Other specified interstitial pulmonary diseases: Secondary | ICD-10-CM | POA: Insufficient documentation

## 2021-10-16 DIAGNOSIS — Z8041 Family history of malignant neoplasm of ovary: Secondary | ICD-10-CM | POA: Diagnosis not present

## 2021-10-16 DIAGNOSIS — D6851 Activated protein C resistance: Secondary | ICD-10-CM | POA: Insufficient documentation

## 2021-10-16 DIAGNOSIS — Z85828 Personal history of other malignant neoplasm of skin: Secondary | ICD-10-CM | POA: Diagnosis not present

## 2021-10-16 DIAGNOSIS — Z800 Family history of malignant neoplasm of digestive organs: Secondary | ICD-10-CM | POA: Diagnosis not present

## 2021-10-16 DIAGNOSIS — Z7901 Long term (current) use of anticoagulants: Secondary | ICD-10-CM | POA: Insufficient documentation

## 2021-10-16 LAB — COMPREHENSIVE METABOLIC PANEL
ALT: 24 U/L (ref 0–44)
AST: 29 U/L (ref 15–41)
Albumin: 3.7 g/dL (ref 3.5–5.0)
Alkaline Phosphatase: 36 U/L — ABNORMAL LOW (ref 38–126)
Anion gap: 7 (ref 5–15)
BUN: 11 mg/dL (ref 6–20)
CO2: 22 mmol/L (ref 22–32)
Calcium: 8.9 mg/dL (ref 8.9–10.3)
Chloride: 108 mmol/L (ref 98–111)
Creatinine, Ser: 0.84 mg/dL (ref 0.44–1.00)
GFR, Estimated: >60 mL/min (ref >60)
Glucose, Bld: 99 mg/dL (ref 70–99)
Potassium: 3.7 mmol/L (ref 3.5–5.1)
Sodium: 137 mmol/L (ref 135–145)
Total Bilirubin: 0.7 mg/dL (ref 0.3–1.2)
Total Protein: 6.9 g/dL (ref 6.5–8.1)

## 2021-10-16 LAB — CBC WITH DIFFERENTIAL/PLATELET
Abs Immature Granulocytes: 0.02 10*3/uL (ref 0.00–0.07)
Basophils Absolute: 0 10*3/uL (ref 0.0–0.1)
Basophils Relative: 1 %
Eosinophils Absolute: 0.1 10*3/uL (ref 0.0–0.5)
Eosinophils Relative: 2 %
HCT: 39.9 % (ref 36.0–46.0)
Hemoglobin: 14 g/dL (ref 12.0–15.0)
Immature Granulocytes: 1 %
Lymphocytes Relative: 8 %
Lymphs Abs: 0.3 10*3/uL — ABNORMAL LOW (ref 0.7–4.0)
MCH: 31.5 pg (ref 26.0–34.0)
MCHC: 35.1 g/dL (ref 30.0–36.0)
MCV: 89.9 fL (ref 80.0–100.0)
Monocytes Absolute: 0.4 10*3/uL (ref 0.1–1.0)
Monocytes Relative: 9 %
Neutro Abs: 3.3 10*3/uL (ref 1.7–7.7)
Neutrophils Relative %: 79 %
Platelets: 194 10*3/uL (ref 150–400)
RBC: 4.44 MIL/uL (ref 3.87–5.11)
RDW: 13.5 % (ref 11.5–15.5)
WBC: 4 10*3/uL (ref 4.0–10.5)
nRBC: 0 % (ref 0.0–0.2)

## 2021-10-16 NOTE — Progress Notes (Signed)
Hematology/Oncology Progress note [phone] [phone]     Patient Care Team: Adin Hector, MD as PCP - General (Internal Medicine) Earlie Server, MD as Consulting Physician (Hematology and Oncology)  Reason for visit Follow-up for PE and chronic anticoagulation.  Referring Physician: Dr.Fleming Herbon.  HISTORY OF PRESENTING ILLNESS:  Pamela Mills 41 y.o.  female with past medical history listed as below who  follow-up for unprovoked pulmonary embolism and  anticoagulation. Since  November 2017, patient started to experience persistent dry cough, exertional shortness of breath, progressive swelling in hands and feet, she was referred to see rheumatology. Workup showed seropositive rheumatoid arthritis, Negative anti-CCP but elevated RF Patient was  initially prednisone and later started on methotrexate weekly (started on 08/15/2016) for treatment.. Patient's respiratory symptoms improved and then worsen again. At that point she was referred by her primary care to see Dr. Raul Del pulmonary for evaluation. CT without contrast on 07/30/2016 showed basilar and apparent from predominant ground glass opacities in the architectural distortion, with possible minimum lower lobe bronchiectasis suggesting interstitial lung disease. There is also mild the bilateral axillary, subpectoral, supraclavicular, mediastinal adenopathy.lymph nodes are increased in number, mildly enlarged. Largest left axillary measuring 11 mm short axis. Findings favor reactive/systemic process with autoimmune disorders.  Patient's rheumatology symptoms improved with treatment. Patient had wax and wane of her respiratory symptoms. In the summer, she has had 2 round trips of 8 hour car ride back and forth to [place] where her father lives. Most recent one was on August 5. She has felt lower extremity swelling for which she has to put on stocking to reduce swelling. Also she felt a little stretchy feeling on her  cough. She mentioned to Dr. Raul Del who ordered a follow-up CT scan. CT without contrast chest on 11/26/2016 showed patchy groundglass in the septal thickening in both lower lobes, less severe than 07/27/2016. There is new rounded consolidation in the left lower lobe, possible infectious etiology. No definitive evidence of interstitial lung disease. Meanwhile her respiratory symptoms has become worse. She has experienced coughing, exertional shortness of breath, and mild hemoptysis. This triggered a d-dimer testing which came back high. Patient had CT anginal chest PE protocol on 11/29/2016 which showed acute appearing nonconclusive pulmonary emboli within several segmental pulmonary artery branches to the left lower lobe. Small pleural-based consolidation with the posterior aspect of the left lower lobe presumably associated focal pulmonary infarction. Associated small left pleural effusion. No evidence of associated right heart straining. Questionable focal low-density pulmonary embolus within the segmental pulmonary artery branch to the right middle lobe.  lower extremity ultrasound revealed no DVT within the right lower extremity. Patient was started on Eliquis 5 mg twice a day.  Patient has had some rheumatology/hypercoagulable workup done with [facility]. Reviewing her labs showed on 08/08/2016, she is tested negative for anti-Cardiolite pain antibody IgG < 9, IgM slightly elevated at 15. Beta-2 glycoprotein IgG and IgM normal, normal axial coronal phase phospholipid, DVvT normal, negative lupus anticoagulant.. Serum protein electrophoresis negative for M spike.  11/29/2016, homocystine 8, lupus anticoagulant was retested was negative, mildly low protein S activity at 48%, normal protein S antigen. Slightly low protein S antigen. Normal anti-thrombin function. She is tested positive for single R506Q mutation heterozygous factor V Leiden mutation.  Patient has a family history of ovarian cancer in  her mom. She reports that she was tested negative for genetic test.  She takes OCP since she was teenage.  This is her fist  episode of blood clot. She was taking of OCP and referred to see gynecology.  # 06/13/2017 Antiphospholipid panel was checked at last visit again.  Which showed negative panel except slightly elevated anti cardiolipin IgM at 15.  Repeat protein S activity was 75%, protein S antigen at 52%, mildly decreased.  slight positive anti cardiolipin IgM does not meet criteria for Antiphospholipid syndrome 02/02/18 repeat protein antigen was normal at 62%.  08/04/2018, decrease to Eliquis 2.5 mg twice daily. 04/14/2020, switched to Xarelto 10 mg daily due to lack of insurance coverage for Eliquis July/August 2022 Switched to Lovenox 45m daily with pregnancy planning     NSIP (nonspecific interstitial pneumonitis)-patient follows up with rheumatologist and was  treated with CellCept and sulfasalazine.  INTERVAL HISTORY Pamela SCHABERis a 41y.o. female who has above history reviewed by me today presents for follow-up management of pulmonary embolism.  Patient switched to Xarelto 10 mg daily-she is on Eliquis for conceive and insurance prefers Xarelto over Eliquis. Tolerates well.  No bleeding events. [place] She has follow-up appointment with rheumatologist for NSIP management.   ROS:  Review of Systems  Constitutional:  Negative for appetite change, chills, fatigue and fever.  HENT:   Negative for hearing loss and voice change.   Eyes:  Negative for eye problems.  Respiratory:  Negative for chest tightness and cough.   Cardiovascular:  Negative for chest pain.  Gastrointestinal:  Negative for abdominal distention, abdominal pain and blood in stool.  Endocrine: Negative for hot flashes.  Genitourinary:  Negative for difficulty urinating and frequency.   Musculoskeletal:  Negative for arthralgias.  Skin:  Negative for itching and rash.   Neurological:  Negative for extremity weakness, headaches and light-headedness.  Hematological:  Negative for adenopathy.  Psychiatric/Behavioral:  Negative for confusion.     MEDICAL HISTORY:  Past Medical History:  Diagnosis Date   Anxiety    Arthritis    Basal cell carcinoma    Post neck - treated years ago    Cancer ([facility]    skin   Collagen vascular disease ([facility]    Depression    Diverticulitis    Dysplastic nevus 05/27/2019   R distal ant thigh - moderate   Factor 5 Leiden mutation, heterozygous [place] 2018   GERD (gastroesophageal reflux disease)    Migraines    Preterm labor    Pulmonary embolism (HCC)    Vaginal Pap smear, abnormal     SURGICAL HISTORY: Past Surgical History:  Procedure Laterality Date   CHOLECYSTECTOMY N/A 06/29/2015   Procedure: LAPAROSCOPIC CHOLECYSTECTOMY;  Surgeon: FStark Klein MD;  [place]  Service: General;  Laterality: N/A;   CHROMOPERTUBATION Right 10/17/2017   Procedure: CHROMOPERTUBATION;  Surgeon: Ward, CHonor Loh MD;  Location: [facility];  Service: Gynecology;  Laterality: Right;   DILATION AND CURETTAGE OF UTERUS  2016   DILATION AND EVACUATION N/A 11/10/2014   Procedure: DILATATION AND EVACUATION;  Surgeon: CHonor LohWard, MD;  Location: [facility];  Service: Gynecology;  Laterality: N/A;   FLEXIBLE BRONCHOSCOPY N/A 02/18/2018   Procedure: FLEXIBLE BRONCHOSCOPY;  Surgeon: AOttie Glazier MD;  Location: [facility];  Service: Thoracic;  Laterality: N/A;   FOOT SURGERY Left 01/2012   FOOT SURGERY Left 2013   Plantar Fasiciatis   LAPAROSCOPIC ENDOMETRIOSIS FULGURATION  2006   LAPAROSCOPIC UNILATERAL SALPINGO OOPHERECTOMY Left 10/17/2017   Procedure: LAPAROSCOPIC UNILATERAL SALPINGO OOPHORECTOMY;  Surgeon: WMaceo Pro MD;  Location:  [facility];  Service: Gynecology;  Laterality: Left;   LAPAROSCOPY N/A 10/17/2017   Procedure: LAPAROSCOPY OPERATIVE, Raeanne Gathers;  Surgeon: Ward, Honor Loh, MD;  Location:  [facility];  Service: Gynecology;  Laterality: N/A;   oophotectomy Left 09/2017   2 cysts on the same side also removed   UPPER GI ENDOSCOPY      SOCIAL HISTORY: Social History   Socioeconomic History   Marital status: Married    Spouse name: Ricky   Number of children: Not on file   Years of education: Not on file   Highest education level: Not on file  Occupational History   Not on file  Tobacco Use   Smoking status: Never   Smokeless tobacco: Never  Vaping Use   Vaping Use: Never used  Substance and Sexual Activity   Alcohol use: No   Drug use: No   Sexual activity: Not Currently  Other Topics Concern   Not on file  Social History Narrative   Not on file   Social Determinants of Health   Financial Resource Strain: Not on file  Food Insecurity: Not on file  Transportation Needs: Not on file  Physical Activity: Not on file  Stress: Not on file  Social Connections: Not on file  Intimate Partner Violence: Not on file    FAMILY HISTORY: Family History  Problem Relation Age of Onset   Ovarian cancer Mother 64   Rheumatologic disease Maternal Aunt    Pancreatic cancer Maternal Grandfather    Colon cancer Paternal Grandmother    Breast cancer Neg Hx     ALLERGIES:  is allergic to other and gluten meal.  MEDICATIONS:  Current Outpatient Medications  Medication Sig Dispense Refill   acetaminophen (TYLENOL) 500 MG tablet Take 1,000 mg by mouth every 8 (eight) hours as needed (pain).     Azathioprine 75 MG TABS Take 1 tablet by mouth 2 (two) times daily.     cetirizine (ZYRTEC) 10 MG tablet Take 10 mg by mouth daily.     Cholecalciferol (VITAMIN D3) 1000 units CAPS Take 1,000 Units by mouth daily.     escitalopram (LEXAPRO) 5 MG tablet Take 15 mg by mouth every evening.      folic acid (FOLVITE) 1 MG tablet Take 1 mg by mouth daily.  11   HUMIRA PEN 40 MG/0.4ML PNKT SMARTSIG:40 Milligram(s) SUB-Q Every 2 Weeks     omeprazole (PRILOSEC) 20 MG capsule Take 20 mg by  mouth daily.      SAXENDA 18 MG/3ML SOPN Inject into the skin.     Semaglutide-Weight Management (WEGOVY) 0.25 MG/0.5ML SOAJ Inject into the skin.     triamcinolone (NASACORT) 55 MCG/ACT AERO nasal inhaler Place 2 sprays into the nose daily.     vitamin B-12 (CYANOCOBALAMIN) 1000 MCG tablet Take 1,000 mcg by mouth daily.     XARELTO 10 MG TABS tablet TAKE 1 TABLET BY MOUTH EVERY DAY 90 tablet 1   Galcanezumab-gnlm 120 MG/ML SOAJ Inject 120 mg into the skin every 28 (twenty-eight) days. (Patient not taking: Reported on 05/02/2021)     LORazepam (ATIVAN) 0.5 MG tablet Take 0.5 mg by mouth 2 (two) times daily as needed for anxiety.  (Patient not taking: Reported on 05/02/2021)     SUMAtriptan (IMITREX) 50 MG tablet Take 50 mg by mouth every 2 (two) hours as needed for migraine.  (Patient not taking: Reported on 05/02/2021)     topiramate (TOPAMAX) 25 MG tablet Take 50 mg by mouth daily.  (  Patient not taking: Reported on 05/02/2021)     No current facility-administered medications for this visit.      Marland Kitchen  PHYSICAL EXAMINATION: ECOG PERFORMANCE STATUS: 1 - Symptomatic but completely ambulatory Vitals:   10/16/21 1404  BP: (!) 132/94  Pulse: 84  Temp: 97.8 F (36.6 C)   Filed Weights   10/16/21 1404  Weight: 244 lb (110.7 kg)   Physical Exam Constitutional:      General: She is not in acute distress.    Appearance: She is obese. She is not diaphoretic.  HENT:     Head: Normocephalic and atraumatic.     Nose: Nose normal.     Mouth/Throat:     Pharynx: No oropharyngeal exudate.  Eyes:     General: No scleral icterus.       Left eye: No discharge.     Conjunctiva/sclera: Conjunctivae normal.     Pupils: Pupils are equal, round, and reactive to light.  Cardiovascular:     Rate and Rhythm: Normal rate and regular rhythm.     Heart sounds: Normal heart sounds. No murmur heard. Pulmonary:     Effort: Pulmonary effort is normal. No respiratory distress.     Breath sounds: Normal breath  sounds. No wheezing or rales.  Chest:     Chest wall: No tenderness.  Abdominal:     General: Bowel sounds are normal. There is no distension.     Palpations: Abdomen is soft. There is no mass.     Tenderness: There is no abdominal tenderness. There is no rebound.  Musculoskeletal:        General: Normal range of motion.     Cervical back: Normal range of motion and neck supple.  Lymphadenopathy:     Cervical: No cervical adenopathy.  Skin:    General: Skin is warm and dry.     Findings: No erythema.  Neurological:     Mental Status: She is alert and oriented to person, place, and time.     Cranial Nerves: No cranial nerve deficit.     Motor: No abnormal muscle tone.     Coordination: Coordination normal.  Psychiatric:        Mood and Affect: Affect normal.       RADIOGRAPHIC STUDIES: I have personally reviewed the radiological images as listed and agreed with the findings in the report. MM DIAG BREAST TOMO BILATERAL  Result Date: 07/30/2021 CLINICAL DATA:  Patient was recalled from screening mammogram for possible asymmetries in both breast. EXAM: DIGITAL DIAGNOSTIC BILATERAL MAMMOGRAM WITH TOMOSYNTHESIS AND CAD; ULTRASOUND LEFT BREAST LIMITED; ULTRASOUND RIGHT BREAST LIMITED TECHNIQUE: Bilateral digital diagnostic mammography and breast tomosynthesis was performed. The images were evaluated with computer-aided detection.; Targeted ultrasound examination of the left breast was performed.; Targeted ultrasound examination of the right breast was performed COMPARISON:  Previous exam(s). ACR Breast Density Category c: The breast tissue is heterogeneously dense, which may obscure small masses. FINDINGS: Additional imaging of both breast was performed. There are persistent obscured masses in the lateral aspect of the right breast and the lateral aspect of the left breast seen on the cc views. There are no malignant type microcalcifications. Targeted ultrasound is performed, showing a  well-circumscribed hypoechoic mass with increased through transmission in the right breast at 10 o'clock 4 cm from the nipple measuring 8 x 5 x 9 mm. Targeted ultrasound is performed, showing 2 adjacent near anechoic masses in the left breast with increased through transmission at 2 o'clock 5 cm from  the nipple measuring 1.3 x 0.6 x 1.0 cm. There is also a near anechoic mass with increased through transmission in the left breast at 3 o'clock 4 cm from the nipple measuring 7 x 5 x 7 mm. IMPRESSION: Probable benign 9 mm cyst in the right breast at 10 o'clock 4 cm from the nipple. Two probable benign adjacent cysts in the 2 o'clock region of the left breast 5 cm from the nipple measuring 1.3 cm and probable benign cyst in the left breast at 3 o'clock 4 cm from the nipple measuring 7 mm. RECOMMENDATION: Short-term interval follow-up bilateral breast ultrasound in 6 months is recommended. I have discussed the findings and recommendations with the patient. If applicable, a reminder letter will be sent to the patient regarding the next appointment. BI-RADS CATEGORY  3: Probably benign. Electronically Signed   By: Lillia Mountain M.D.   On: 07/30/2021 15:50  US BREAST LTD UNI RIGHT INC AXILLA  Result Date: 07/30/2021 CLINICAL DATA:  Patient was recalled from screening mammogram for possible asymmetries in both breast. EXAM: DIGITAL DIAGNOSTIC BILATERAL MAMMOGRAM WITH TOMOSYNTHESIS AND CAD; ULTRASOUND LEFT BREAST LIMITED; ULTRASOUND RIGHT BREAST LIMITED TECHNIQUE: Bilateral digital diagnostic mammography and breast tomosynthesis was performed. The images were evaluated with computer-aided detection.; Targeted ultrasound examination of the left breast was performed.; Targeted ultrasound examination of the right breast was performed COMPARISON:  Previous exam(s). ACR Breast Density Category c: The breast tissue is heterogeneously dense, which may obscure small masses. FINDINGS: Additional imaging of both breast was performed.  There are persistent obscured masses in the lateral aspect of the right breast and the lateral aspect of the left breast seen on the cc views. There are no malignant type microcalcifications. Targeted ultrasound is performed, showing a well-circumscribed hypoechoic mass with increased through transmission in the right breast at 10 o'clock 4 cm from the nipple measuring 8 x 5 x 9 mm. Targeted ultrasound is performed, showing 2 adjacent near anechoic masses in the left breast with increased through transmission at 2 o'clock 5 cm from the nipple measuring 1.3 x 0.6 x 1.0 cm. There is also a near anechoic mass with increased through transmission in the left breast at 3 o'clock 4 cm from the nipple measuring 7 x 5 x 7 mm. IMPRESSION: Probable benign 9 mm cyst in the right breast at 10 o'clock 4 cm from the nipple. Two probable benign adjacent cysts in the 2 o'clock region of the left breast 5 cm from the nipple measuring 1.3 cm and probable benign cyst in the left breast at 3 o'clock 4 cm from the nipple measuring 7 mm. RECOMMENDATION: Short-term interval follow-up bilateral breast ultrasound in 6 months is recommended. I have discussed the findings and recommendations with the patient. If applicable, a reminder letter will be sent to the patient regarding the next appointment. BI-RADS CATEGORY  3: Probably benign. Electronically Signed   By: Lillia Mountain M.D.   On: 07/30/2021 15:50  US BREAST LTD UNI LEFT INC AXILLA  Result Date: 07/30/2021 CLINICAL DATA:  Patient was recalled from screening mammogram for possible asymmetries in both breast. EXAM: DIGITAL DIAGNOSTIC BILATERAL MAMMOGRAM WITH TOMOSYNTHESIS AND CAD; ULTRASOUND LEFT BREAST LIMITED; ULTRASOUND RIGHT BREAST LIMITED TECHNIQUE: Bilateral digital diagnostic mammography and breast tomosynthesis was performed. The images were evaluated with computer-aided detection.; Targeted ultrasound examination of the left breast was performed.; Targeted ultrasound  examination of the right breast was performed COMPARISON:  Previous exam(s). ACR Breast Density Category c: The breast tissue is heterogeneously  dense, which may obscure small masses. FINDINGS: Additional imaging of both breast was performed. There are persistent obscured masses in the lateral aspect of the right breast and the lateral aspect of the left breast seen on the cc views. There are no malignant type microcalcifications. Targeted ultrasound is performed, showing a well-circumscribed hypoechoic mass with increased through transmission in the right breast at 10 o'clock 4 cm from the nipple measuring 8 x 5 x 9 mm. Targeted ultrasound is performed, showing 2 adjacent near anechoic masses in the left breast with increased through transmission at 2 o'clock 5 cm from the nipple measuring 1.3 x 0.6 x 1.0 cm. There is also a near anechoic mass with increased through transmission in the left breast at 3 o'clock 4 cm from the nipple measuring 7 x 5 x 7 mm. IMPRESSION: Probable benign 9 mm cyst in the right breast at 10 o'clock 4 cm from the nipple. Two probable benign adjacent cysts in the 2 o'clock region of the left breast 5 cm from the nipple measuring 1.3 cm and probable benign cyst in the left breast at 3 o'clock 4 cm from the nipple measuring 7 mm. RECOMMENDATION: Short-term interval follow-up bilateral breast ultrasound in 6 months is recommended. I have discussed the findings and recommendations with the patient. If applicable, a reminder letter will be sent to the patient regarding the next appointment. BI-RADS CATEGORY  3: Probably benign. Electronically Signed   By: Lillia Mountain M.D.   On: 07/30/2021 15:50      ASSESSMENT & PLAN:  1. Heterozygous factor V Leiden mutation ([place])   2. History of pulmonary embolism   3. NSIP (nonspecific interstitial pneumonitis) (HCC)    #History of pulmonary embolism in the context of OCP use, interstitial lung disease, heterozygous factor V Leiden  mutation Currently on Xarelto 10 mg for prophylaxis daily for prophylaxis.  She tolerates well.  # NSIP (nonspecific interstitial pneumonitis)-patient follows up with rheumatologist #Lymphocytopenia, ALC 0.3.  Chronic.  Likely secondary to NSIP/treatments.  Follow up in 6 months Orders Placed This Encounter  Procedures   CBC with Differential/Platelet    Standing Status:   Future    Standing Expiration Date:   10/17/2022   Comprehensive metabolic panel    Standing Status:   Future    Standing Expiration Date:   10/16/2022    Earlie Server, MD, PhD Hematology Oncology 10/16/2021

## 2021-10-30 ENCOUNTER — Other Ambulatory Visit: Payer: BC Managed Care – PPO

## 2021-11-13 ENCOUNTER — Ambulatory Visit: Payer: BC Managed Care – PPO | Admitting: Oncology

## 2022-01-21 ENCOUNTER — Encounter (INDEPENDENT_AMBULATORY_CARE_PROVIDER_SITE_OTHER): Payer: Self-pay

## 2022-02-08 ENCOUNTER — Other Ambulatory Visit: Payer: Self-pay | Admitting: Internal Medicine

## 2022-02-08 DIAGNOSIS — R109 Unspecified abdominal pain: Secondary | ICD-10-CM

## 2022-02-13 ENCOUNTER — Ambulatory Visit
Admission: RE | Admit: 2022-02-13 | Discharge: 2022-02-13 | Disposition: A | Discharge status: Home / Self Care | Payer: BC Managed Care – PPO | Source: Ambulatory Visit | Attending: Internal Medicine | Admitting: Internal Medicine

## 2022-02-13 DIAGNOSIS — R109 Unspecified abdominal pain: Secondary | ICD-10-CM | POA: Diagnosis present

## 2022-04-18 ENCOUNTER — Inpatient Hospital Stay (HOSPITAL_BASED_OUTPATIENT_CLINIC_OR_DEPARTMENT_OTHER): Payer: BC Managed Care – PPO | Admitting: Oncology

## 2022-04-18 ENCOUNTER — Inpatient Hospital Stay: Payer: BC Managed Care – PPO | Attending: Oncology

## 2022-04-18 ENCOUNTER — Encounter: Payer: Self-pay | Admitting: Oncology

## 2022-04-18 VITALS — BP 128/63 | HR 66 | Temp 98.0°F | Wt 211.4 lb

## 2022-04-18 DIAGNOSIS — I2699 Other pulmonary embolism without acute cor pulmonale: Secondary | ICD-10-CM | POA: Insufficient documentation

## 2022-04-18 DIAGNOSIS — D72810 Lymphocytopenia: Secondary | ICD-10-CM | POA: Insufficient documentation

## 2022-04-18 DIAGNOSIS — J8489 Other specified interstitial pulmonary diseases: Secondary | ICD-10-CM | POA: Diagnosis not present

## 2022-04-18 DIAGNOSIS — D6851 Activated protein C resistance: Secondary | ICD-10-CM | POA: Insufficient documentation

## 2022-04-18 DIAGNOSIS — D709 Neutropenia, unspecified: Secondary | ICD-10-CM | POA: Diagnosis not present

## 2022-04-18 DIAGNOSIS — D702 Other drug-induced agranulocytosis: Secondary | ICD-10-CM | POA: Diagnosis not present

## 2022-04-18 DIAGNOSIS — J849 Interstitial pulmonary disease, unspecified: Secondary | ICD-10-CM | POA: Diagnosis not present

## 2022-04-18 DIAGNOSIS — Z85828 Personal history of other malignant neoplasm of skin: Secondary | ICD-10-CM | POA: Diagnosis not present

## 2022-04-18 DIAGNOSIS — Z7901 Long term (current) use of anticoagulants: Secondary | ICD-10-CM | POA: Diagnosis not present

## 2022-04-18 LAB — CBC WITH DIFFERENTIAL/PLATELET
Abs Immature Granulocytes: 0.01 10*3/uL (ref 0.00–0.07)
Basophils Absolute: 0 10*3/uL (ref 0.0–0.1)
Basophils Relative: 1 %
Eosinophils Absolute: 0.1 10*3/uL (ref 0.0–0.5)
Eosinophils Relative: 4 %
HCT: 38.2 % (ref 36.0–46.0)
Hemoglobin: 12.7 g/dL (ref 12.0–15.0)
Immature Granulocytes: 0 %
Lymphocytes Relative: 14 %
Lymphs Abs: 0.3 10*3/uL — ABNORMAL LOW (ref 0.7–4.0)
MCH: 32.6 pg (ref 26.0–34.0)
MCHC: 33.2 g/dL (ref 30.0–36.0)
MCV: 97.9 fL (ref 80.0–100.0)
Monocytes Absolute: 0.3 10*3/uL (ref 0.1–1.0)
Monocytes Relative: 11 %
Neutro Abs: 1.6 10*3/uL — ABNORMAL LOW (ref 1.7–7.7)
Neutrophils Relative %: 70 %
Platelets: 223 10*3/uL (ref 150–400)
RBC: 3.9 MIL/uL (ref 3.87–5.11)
RDW: 15.3 % (ref 11.5–15.5)
WBC: 2.3 10*3/uL — ABNORMAL LOW (ref 4.0–10.5)
nRBC: 0 % (ref 0.0–0.2)

## 2022-04-18 LAB — COMPREHENSIVE METABOLIC PANEL
ALT: 24 U/L (ref 0–44)
AST: 29 U/L (ref 15–41)
Albumin: 4.1 g/dL (ref 3.5–5.0)
Alkaline Phosphatase: 42 U/L (ref 38–126)
Anion gap: 7 (ref 5–15)
BUN: 13 mg/dL (ref 6–20)
CO2: 24 mmol/L (ref 22–32)
Calcium: 8.6 mg/dL — ABNORMAL LOW (ref 8.9–10.3)
Chloride: 104 mmol/L (ref 98–111)
Creatinine, Ser: 0.83 mg/dL (ref 0.44–1.00)
GFR, Estimated: >60 mL/min (ref >60)
Glucose, Bld: 93 mg/dL (ref 70–99)
Potassium: 4.1 mmol/L (ref 3.5–5.1)
Sodium: 135 mmol/L (ref 135–145)
Total Bilirubin: 1.2 mg/dL (ref 0.3–1.2)
Total Protein: 7.1 g/dL (ref 6.5–8.1)

## 2022-04-18 MED ORDER — RIVAROXABAN 10 MG PO TABS: 10.0000 mg | ORAL_TABLET | Freq: Every day | ORAL | 1 refills | Status: DC

## 2022-04-18 NOTE — Assessment & Plan Note (Addendum)
Likely due to recently increased dose of Azathioprine.  Repeat cbc in 2 weeks.  Recommend patient to discussed with rheumatology

## 2022-04-18 NOTE — Assessment & Plan Note (Addendum)
#  History of pulmonary embolism due to OCP use, interstitial lung disease, heterozygous factor V Leiden mutation Currently on Xarelto 10 mg for prophylaxis daily for prophylaxis.  She tolerates well. Discussed about red flag symptoms for thrombosis recurrence.

## 2022-04-18 NOTE — Assessment & Plan Note (Signed)
#  NSIP (nonspecific interstitial pneumonitis)-patient follows up with rheumatologist

## 2022-04-18 NOTE — Progress Notes (Signed)
Hematology/Oncology Progress note [phone] [phone]  Reason for visit Follow-up for PE and chronic anticoagulation.  ASSESSMENT & PLAN:   Pulmonary embolus (HCC)  #History of pulmonary embolism due to OCP use, interstitial lung disease, heterozygous factor V Leiden mutation Currently on Xarelto 10 mg for prophylaxis daily for prophylaxis.  She tolerates well. Discussed about red flag symptoms for thrombosis recurrence.   Heterozygous factor V Leiden mutation ([place]) On anticoagulation prophylaxis.   Drug-induced neutropenia (HCC) Likely due to recently increased dose of Azathioprine.  Repeat cbc in 2 weeks.  Recommend patient to discussed with rheumatology  Interstitial lung disorders ([place]) # NSIP (nonspecific interstitial pneumonitis)-patient follows up with rheumatologist   Orders Placed This Encounter  Procedures   CBC with Differential/Platelet    Standing Status:   Future    Standing Expiration Date:   04/18/2023   CBC with Differential/Platelet    Standing Status:   Future    Standing Expiration Date:   04/18/2023   Comprehensive metabolic panel    Standing Status:   Future    Standing Expiration Date:   04/18/2023   Follow up in 6 months.  All questions were answered. The patient knows to call the clinic with any problems, questions or concerns.  Earlie Server, MD, PhD [facility] Hematology Oncology 04/18/2022    HISTORY OF PRESENTING ILLNESS:  Pamela Mills 42 y.o.  female with past medical history listed as below who  follow-up for unprovoked pulmonary embolism and  anticoagulation. Since  November 2017, patient started to experience persistent dry cough, exertional shortness of breath, progressive swelling in hands and feet, she was referred to see rheumatology. Workup showed seropositive rheumatoid arthritis, Negative anti-CCP but elevated RF Patient was  initially prednisone and later started on methotrexate weekly (started on 08/15/2016)  for treatment.. Patient's respiratory symptoms improved and then worsen again. At that point she was referred by her primary care to see Dr. Raul Del pulmonary for evaluation. CT without contrast on 07/30/2016 showed basilar and apparent from predominant ground glass opacities in the architectural distortion, with possible minimum lower lobe bronchiectasis suggesting interstitial lung disease. There is also mild the bilateral axillary, subpectoral, supraclavicular, mediastinal adenopathy.lymph nodes are increased in number, mildly enlarged. Largest left axillary measuring 11 mm short axis. Findings favor reactive/systemic process with autoimmune disorders.  Patient's rheumatology symptoms improved with treatment. Patient had wax and wane of her respiratory symptoms. In the summer, she has had 2 round trips of 8 hour car ride back and forth to [place] where her father lives. Most recent one was on August 5. She has felt lower extremity swelling for which she has to put on stocking to reduce swelling. Also she felt a little stretchy feeling on her cough. She mentioned to Dr. Raul Del who ordered a follow-up CT scan. CT without contrast chest on 11/26/2016 showed patchy groundglass in the septal thickening in both lower lobes, less severe than 07/27/2016. There is new rounded consolidation in the left lower lobe, possible infectious etiology. No definitive evidence of interstitial lung disease. Meanwhile her respiratory symptoms has become worse. She has experienced coughing, exertional shortness of breath, and mild hemoptysis. This triggered a d-dimer testing which came back high. Patient had CT anginal chest PE protocol on 11/29/2016 which showed acute appearing nonconclusive pulmonary emboli within several segmental pulmonary artery branches to the left lower lobe. Small pleural-based consolidation with the posterior aspect of the left lower lobe presumably associated focal pulmonary infarction. Associated small left  pleural effusion. No  evidence of associated right heart straining. Questionable focal low-density pulmonary embolus within the segmental pulmonary artery branch to the right middle lobe.  lower extremity ultrasound revealed no DVT within the right lower extremity. Patient was started on Eliquis 5 mg twice a day.  Patient has had some rheumatology/hypercoagulable workup done with [facility]. Reviewing her labs showed on 08/08/2016, she is tested negative for anti-Cardiolite pain antibody IgG < 9, IgM slightly elevated at 15. Beta-2 glycoprotein IgG and IgM normal, normal axial coronal phase phospholipid, DVvT normal, negative lupus anticoagulant.. Serum protein electrophoresis negative for M spike.  11/29/2016, homocystine 8, lupus anticoagulant was retested was negative, mildly low protein S activity at 48%, normal protein S antigen. Slightly low protein S antigen. Normal anti-thrombin function. She is tested positive for single R506Q mutation heterozygous factor V Leiden mutation.  Patient has a family history of ovarian cancer in her mom. She reports that she was tested negative for genetic test.  She takes OCP since she was teenage.  This is her fist episode of blood clot. She was taking of OCP and referred to see gynecology.  # 06/13/2017 Antiphospholipid panel was checked at last visit again.  Which showed negative panel except slightly elevated anti cardiolipin IgM at 15.  Repeat protein S activity was 75%, protein S antigen at 52%, mildly decreased.  slight positive anti cardiolipin IgM does not meet criteria for Antiphospholipid syndrome 02/02/18 repeat protein antigen was normal at 62%.  08/04/2018, decrease to Eliquis 2.5 mg twice daily. 04/14/2020, switched to Xarelto 10 mg daily due to lack of insurance coverage for Eliquis July/August 2022 Switched to Lovenox 22m daily with pregnancy planning     NSIP (nonspecific interstitial pneumonitis)-patient follows up with rheumatologist  and was  treated with CellCept and sulfasalazine.  INTERVAL HISTORY Pamela COYNEis a 42y.o. female who has above history reviewed by me today presents for follow-up management of pulmonary embolism.  Patient switched to Xarelto 10 mg daily-she is on Eliquis for conceive and insurance prefers Xarelto over Eliquis. Tolerates Xarelto well.  No bleeding events.  Recently she noticed a few days of calf muscle pain, spontaneously resolved.  She has follow-up appointment with rheumatologist for NSIP management. Azathioprine dose was recently increased to 1079mBID    Review of Systems  Constitutional:  Negative for appetite change, chills, fatigue and fever.  HENT:   Negative for hearing loss and voice change.   Eyes:  Negative for eye problems.  Respiratory:  Negative for chest tightness and cough.   Cardiovascular:  Negative for chest pain.  Gastrointestinal:  Negative for abdominal distention, abdominal pain and blood in stool.  Endocrine: Negative for hot flashes.  Genitourinary:  Negative for difficulty urinating and frequency.   Musculoskeletal:  Negative for arthralgias.  Skin:  Negative for itching and rash.  Neurological:  Negative for extremity weakness, headaches and light-headedness.  Hematological:  Negative for adenopathy.  Psychiatric/Behavioral:  Negative for confusion.     MEDICAL HISTORY:  Past Medical History:  Diagnosis Date   Anxiety    Arthritis    Basal cell carcinoma    Post neck - treated years ago    Cancer ([place]   skin   Collagen vascular disease ([facility]   Depression    Diverticulitis    Dysplastic nevus 05/27/2019   R distal ant thigh - moderate   Factor 5 Leiden mutation, heterozygous [id number]   GERD (gastroesophageal reflux disease)    Migraines  Preterm labor    Pulmonary embolism (HCC)    Vaginal Pap smear, abnormal     SURGICAL HISTORY: Past Surgical History:  Procedure Laterality Date   CHOLECYSTECTOMY N/A 06/29/2015   Procedure:  LAPAROSCOPIC CHOLECYSTECTOMY;  Surgeon: Stark Klein, MD;  Location: [place];  Service: General;  Laterality: N/A;   CHROMOPERTUBATION Right 10/17/2017   Procedure: CHROMOPERTUBATION;  Surgeon: Ward, Honor Loh, MD;  Location: [facility];  Service: Gynecology;  Laterality: Right;   DILATION AND CURETTAGE OF UTERUS  2016   DILATION AND EVACUATION N/A 11/10/2014   Procedure: DILATATION AND EVACUATION;  Surgeon: Honor Loh Ward, MD;  Location: [facility];  Service: Gynecology;  Laterality: N/A;   FLEXIBLE BRONCHOSCOPY N/A 02/18/2018   Procedure: FLEXIBLE BRONCHOSCOPY;  Surgeon: Ottie Glazier, MD;  Location: [facility];  Service: Thoracic;  Laterality: N/A;   FOOT SURGERY Left 01/2012   FOOT SURGERY Left 2013   Plantar Fasiciatis   LAPAROSCOPIC ENDOMETRIOSIS FULGURATION  2006   LAPAROSCOPIC UNILATERAL SALPINGO OOPHERECTOMY Left 10/17/2017   Procedure: LAPAROSCOPIC UNILATERAL SALPINGO OOPHORECTOMY;  Surgeon: Ward, Honor Loh, MD;  Location: [facility];  Service: Gynecology;  Laterality: Left;   LAPAROSCOPY N/A 10/17/2017   Procedure: LAPAROSCOPY OPERATIVE, Raeanne Gathers;  Surgeon: Ward, Honor Loh, MD;  Location: [facility];  Service: Gynecology;  Laterality: N/A;   oophotectomy Left 09/2017   2 cysts on the same side also removed   UPPER GI ENDOSCOPY      SOCIAL HISTORY: Social History   Socioeconomic History   Marital status: Married    Spouse name: Ricky   Number of children: Not on file   Years of education: Not on file   Highest education level: Not on file  Occupational History   Not on file  Tobacco Use   Smoking status: Never   Smokeless tobacco: Never  Vaping Use   Vaping Use: Never used  Substance and Sexual Activity   Alcohol use: No   Drug use: No   Sexual activity: Not Currently  Other Topics Concern   Not on file  Social History Narrative   Not on file   Social Determinants of Health   Financial Resource Strain: Not on file  Food Insecurity: Not on  file  Transportation Needs: Not on file  Physical Activity: Not on file  Stress: Not on file  Social Connections: Not on file  Intimate Partner Violence: Not on file    FAMILY HISTORY: Family History  Problem Relation Age of Onset   Ovarian cancer Mother 40   Rheumatologic disease Maternal Aunt    Pancreatic cancer Maternal Grandfather    Colon cancer Paternal Grandmother    Breast cancer Neg Hx     ALLERGIES:  is allergic to other and gluten meal.  MEDICATIONS:  Current Outpatient Medications  Medication Sig Dispense Refill   acetaminophen (TYLENOL) 500 MG tablet Take 1,000 mg by mouth every 8 (eight) hours as needed (pain).     Azathioprine 75 MG TABS Take 1 tablet by mouth 2 (two) times daily.     cetirizine (ZYRTEC) 10 MG tablet Take 10 mg by mouth daily.     Cholecalciferol (VITAMIN D3) 1000 units CAPS Take 1,000 Units by mouth daily.     escitalopram (LEXAPRO) 5 MG tablet Take 15 mg by mouth every evening.      folic acid (FOLVITE) 1 MG tablet Take 1 mg by mouth daily.  11   HUMIRA PEN 40 MG/0.4ML PNKT SMARTSIG:40 Milligram(s) SUB-Q Every 2 Weeks  metFORMIN (GLUCOPHAGE-XR) 500 MG 24 hr tablet Take 1,000 mg by mouth daily.     omeprazole (PRILOSEC) 20 MG capsule Take 20 mg by mouth daily.      pregabalin (LYRICA) 150 MG capsule Take 150 mg by mouth 2 (two) times daily.     Semaglutide-Weight Management (WEGOVY) 0.25 MG/0.5ML SOAJ Inject into the skin.     triamcinolone (NASACORT) 55 MCG/ACT AERO nasal inhaler Place 2 sprays into the nose daily.     vitamin B-12 (CYANOCOBALAMIN) 1000 MCG tablet Take 1,000 mcg by mouth daily.     Galcanezumab-gnlm 120 MG/ML SOAJ Inject 120 mg into the skin every 28 (twenty-eight) days. (Patient not taking: Reported on 05/02/2021)     LORazepam (ATIVAN) 0.5 MG tablet Take 0.5 mg by mouth 2 (two) times daily as needed for anxiety.  (Patient not taking: Reported on 05/02/2021)     rivaroxaban (XARELTO) 10 MG TABS tablet Take 1 tablet (10 mg  total) by mouth daily. 90 tablet 1   SAXENDA 18 MG/3ML SOPN Inject into the skin. (Patient not taking: Reported on 04/18/2022)     SUMAtriptan (IMITREX) 50 MG tablet Take 50 mg by mouth every 2 (two) hours as needed for migraine.  (Patient not taking: Reported on 05/02/2021)     topiramate (TOPAMAX) 25 MG tablet Take 50 mg by mouth daily.  (Patient not taking: Reported on 05/02/2021)     No current facility-administered medications for this visit.      Marland Kitchen  PHYSICAL EXAMINATION: ECOG PERFORMANCE STATUS: 1 - Symptomatic but completely ambulatory Vitals:   04/18/22 1500  BP: 128/63  Pulse: 66  Temp: 98 F (36.7 C)  SpO2: 100%   Filed Weights   04/18/22 1500  Weight: 211 lb 6.4 oz (95.9 kg)   Physical Exam Constitutional:      General: She is not in acute distress.    Appearance: She is obese. She is not diaphoretic.  HENT:     Head: Normocephalic.     Mouth/Throat:     Pharynx: No oropharyngeal exudate.  Eyes:     General: No scleral icterus.    Pupils: Pupils are equal, round, and reactive to light.  Cardiovascular:     Rate and Rhythm: Normal rate and regular rhythm.     Heart sounds: Normal heart sounds. No murmur heard. Pulmonary:     Effort: Pulmonary effort is normal. No respiratory distress.     Breath sounds: Normal breath sounds. No wheezing.  Abdominal:     General: There is no distension.  Musculoskeletal:        General: Normal range of motion.     Cervical back: Normal range of motion and neck supple.  Lymphadenopathy:     Cervical: No cervical adenopathy.  Skin:    General: Skin is warm and dry.     Findings: No erythema.  Neurological:     Mental Status: She is alert and oriented to person, place, and time. Mental status is at baseline.     Cranial Nerves: No cranial nerve deficit.     Motor: No abnormal muscle tone.     Coordination: Coordination normal.  Psychiatric:        Mood and Affect: Mood and affect normal.       RADIOGRAPHIC STUDIES: I  have personally reviewed the radiological images as listed and agreed with the findings in the report. CT RENAL STONE STUDY  Result Date: 02/13/2022 CLINICAL DATA:  Two months of gross hematuria. EXAM: CT ABDOMEN  AND PELVIS WITHOUT CONTRAST TECHNIQUE: Multidetector CT imaging of the abdomen and pelvis was performed following the standard protocol without IV contrast. RADIATION DOSE REDUCTION: This exam was performed according to the departmental dose-optimization program which includes automated exposure control, adjustment of the mA and/or kV according to patient size and/or use of iterative reconstruction technique. COMPARISON:  CT chest December 29, 2017 CT abdomen pelvis Aug 20, 2017. FINDINGS: Lower chest: Increased ground-glass opacities with interlobular septal thickening and interposed bronchiectasis in the bilateral lower lobes, right middle lobe and lingula. Hepatobiliary: Mild diffuse hepatic steatosis. Gallbladder surgically absent. No biliary ductal dilation. Pancreas: No pancreatic ductal dilation or evidence of acute inflammation. Spleen: Mild splenomegaly measuring 14.2 cm in maximum axial dimension. Adrenals/Urinary Tract: Bilateral adrenal glands appear normal. No hydronephrosis. Tiny punctate nonobstructive bilateral renal stones. 3 mm stone in the urinary bladder. Mild wall thickening of an incompletely distended urinary bladder. Stomach/Bowel: Stomach is distended with ingested material and gas without focal wall thickening. Mild wall thickening of the proximal duodenum with areas of fibrofatty infiltration for instance on image 33/2. No pathologic dilation of small or large bowel. Normal appendix. Left-sided colonic diverticulosis without findings of acute diverticulitis. Vascular/Lymphatic: Normal caliber abdominal aorta. No pathologically enlarged abdominal or pelvic lymph nodes. Prominent pelvic lymph nodes are similar prior for instance a left external iliac lymph node measuring 7 mm in  short axis on image 72/2 is unchanged in size. Reproductive: Uterus and bilateral adnexa are unremarkable. Other: No significant abdominopelvic free fluid. Musculoskeletal: No acute osseous abnormality. Chronic bilateral L5 pars defects with grade 1 L5 on S1 anterolisthesis. IMPRESSION: 1. Tiny punctate nonobstructive renal stones with a 3 mm stone in the urinary bladder. No hydronephrosis. 2. Mild wall thickening of an incompletely distended urinary bladder. Correlate with urinalysis to exclude cystitis. 3. Increased ground-glass opacities with interlobular septal thickening and interposed bronchiectasis in the bilateral lower lobes, right middle lobe and lingula. Findings are nonspecific. Suggest further evaluation with high-resolution ILD protocol chest CT with inspiratory and expiratory views. 4. Mild diffuse hepatic steatosis. 5. Mild splenomegaly. 6. Left-sided colonic diverticulosis without findings of acute diverticulitis. 7. Mild duodenal wall thickening with focal areas of fibrofatty infiltration suggestive of chronic inflammation including ulcerative disease. Electronically Signed   By: Dahlia Bailiff M.D.   On: 02/13/2022 12:57

## 2022-04-18 NOTE — Assessment & Plan Note (Signed)
On anticoagulation prophylaxis.

## 2022-05-02 ENCOUNTER — Inpatient Hospital Stay: Payer: BC Managed Care – PPO | Attending: Oncology

## 2022-05-02 DIAGNOSIS — I2699 Other pulmonary embolism without acute cor pulmonale: Secondary | ICD-10-CM | POA: Insufficient documentation

## 2022-05-02 DIAGNOSIS — D6851 Activated protein C resistance: Secondary | ICD-10-CM

## 2022-05-02 LAB — CBC WITH DIFFERENTIAL/PLATELET
Abs Immature Granulocytes: 0.01 10*3/uL (ref 0.00–0.07)
Basophils Absolute: 0 10*3/uL (ref 0.0–0.1)
Basophils Relative: 1 %
Eosinophils Absolute: 0.1 10*3/uL (ref 0.0–0.5)
Eosinophils Relative: 3 %
HCT: 35.3 % — ABNORMAL LOW (ref 36.0–46.0)
Hemoglobin: 12.5 g/dL (ref 12.0–15.0)
Immature Granulocytes: 0 %
Lymphocytes Relative: 14 %
Lymphs Abs: 0.4 10*3/uL — ABNORMAL LOW (ref 0.7–4.0)
MCH: 32.7 pg (ref 26.0–34.0)
MCHC: 35.4 g/dL (ref 30.0–36.0)
MCV: 92.4 fL (ref 80.0–100.0)
Monocytes Absolute: 0.3 10*3/uL (ref 0.1–1.0)
Monocytes Relative: 9 %
Neutro Abs: 2.1 10*3/uL (ref 1.7–7.7)
Neutrophils Relative %: 73 %
Platelets: 183 10*3/uL (ref 150–400)
RBC: 3.82 MIL/uL — ABNORMAL LOW (ref 3.87–5.11)
RDW: 15 % (ref 11.5–15.5)
WBC: 2.9 10*3/uL — ABNORMAL LOW (ref 4.0–10.5)
nRBC: 0 % (ref 0.0–0.2)

## 2022-05-06 ENCOUNTER — Encounter: Payer: Self-pay | Admitting: Oncology

## 2022-06-05 ENCOUNTER — Ambulatory Visit: Payer: BC Managed Care – PPO | Admitting: Dermatology

## 2022-06-30 ENCOUNTER — Encounter: Payer: Self-pay | Admitting: Oncology

## 2022-07-01 ENCOUNTER — Encounter: Payer: Self-pay | Admitting: Internal Medicine

## 2022-07-01 NOTE — Telephone Encounter (Signed)
Please see attachment...

## 2022-07-02 ENCOUNTER — Other Ambulatory Visit: Payer: Self-pay | Admitting: Internal Medicine

## 2022-07-02 DIAGNOSIS — N63 Unspecified lump in unspecified breast: Secondary | ICD-10-CM

## 2022-07-03 ENCOUNTER — Other Ambulatory Visit: Payer: Self-pay | Admitting: Oncology

## 2022-07-03 DIAGNOSIS — R161 Splenomegaly, not elsewhere classified: Secondary | ICD-10-CM

## 2022-07-03 NOTE — Telephone Encounter (Addendum)
Please move appts in July up to May, with labs being at least 2 weeks prior to MD. Please inform pt of appt changes.

## 2022-07-15 ENCOUNTER — Ambulatory Visit
Admission: RE | Admit: 2022-07-15 | Discharge: 2022-07-15 | Disposition: A | Discharge status: Home / Self Care | Payer: BC Managed Care – PPO | Source: Ambulatory Visit | Attending: Internal Medicine | Admitting: Internal Medicine

## 2022-07-15 DIAGNOSIS — N63 Unspecified lump in unspecified breast: Secondary | ICD-10-CM

## 2022-07-31 ENCOUNTER — Inpatient Hospital Stay: Payer: BC Managed Care – PPO | Attending: Oncology

## 2022-07-31 DIAGNOSIS — I2699 Other pulmonary embolism without acute cor pulmonale: Secondary | ICD-10-CM | POA: Insufficient documentation

## 2022-07-31 DIAGNOSIS — E538 Deficiency of other specified B group vitamins: Secondary | ICD-10-CM | POA: Insufficient documentation

## 2022-07-31 DIAGNOSIS — D72810 Lymphocytopenia: Secondary | ICD-10-CM | POA: Diagnosis not present

## 2022-07-31 DIAGNOSIS — D6851 Activated protein C resistance: Secondary | ICD-10-CM | POA: Diagnosis not present

## 2022-07-31 DIAGNOSIS — J8489 Other specified interstitial pulmonary diseases: Secondary | ICD-10-CM | POA: Insufficient documentation

## 2022-07-31 DIAGNOSIS — Z800 Family history of malignant neoplasm of digestive organs: Secondary | ICD-10-CM | POA: Diagnosis not present

## 2022-07-31 DIAGNOSIS — R161 Splenomegaly, not elsewhere classified: Secondary | ICD-10-CM | POA: Diagnosis not present

## 2022-07-31 DIAGNOSIS — Z7901 Long term (current) use of anticoagulants: Secondary | ICD-10-CM | POA: Diagnosis not present

## 2022-07-31 DIAGNOSIS — Z8041 Family history of malignant neoplasm of ovary: Secondary | ICD-10-CM | POA: Diagnosis not present

## 2022-07-31 LAB — COMPREHENSIVE METABOLIC PANEL
ALT: 14 U/L (ref 0–44)
AST: 21 U/L (ref 15–41)
Albumin: 4.4 g/dL (ref 3.5–5.0)
Alkaline Phosphatase: 37 U/L — ABNORMAL LOW (ref 38–126)
Anion gap: 10 (ref 5–15)
BUN: 15 mg/dL (ref 6–20)
CO2: 22 mmol/L (ref 22–32)
Calcium: 9 mg/dL (ref 8.9–10.3)
Chloride: 103 mmol/L (ref 98–111)
Creatinine, Ser: 0.83 mg/dL (ref 0.44–1.00)
GFR, Estimated: >60 mL/min (ref >60)
Glucose, Bld: 93 mg/dL (ref 70–99)
Potassium: 3.7 mmol/L (ref 3.5–5.1)
Sodium: 135 mmol/L (ref 135–145)
Total Bilirubin: 0.8 mg/dL (ref 0.3–1.2)
Total Protein: 7.4 g/dL (ref 6.5–8.1)

## 2022-07-31 LAB — CBC WITH DIFFERENTIAL/PLATELET
Abs Immature Granulocytes: 0.01 10*3/uL (ref 0.00–0.07)
Basophils Absolute: 0 10*3/uL (ref 0.0–0.1)
Basophils Relative: 1 %
Eosinophils Absolute: 0.1 10*3/uL (ref 0.0–0.5)
Eosinophils Relative: 2 %
HCT: 37 % (ref 36.0–46.0)
Hemoglobin: 13 g/dL (ref 12.0–15.0)
Immature Granulocytes: 0 %
Lymphocytes Relative: 15 %
Lymphs Abs: 0.4 10*3/uL — ABNORMAL LOW (ref 0.7–4.0)
MCH: 33.6 pg (ref 26.0–34.0)
MCHC: 35.1 g/dL (ref 30.0–36.0)
MCV: 95.6 fL (ref 80.0–100.0)
Monocytes Absolute: 0.3 10*3/uL (ref 0.1–1.0)
Monocytes Relative: 11 %
Neutro Abs: 2 10*3/uL (ref 1.7–7.7)
Neutrophils Relative %: 71 %
Platelets: 183 10*3/uL (ref 150–400)
RBC: 3.87 MIL/uL (ref 3.87–5.11)
RDW: 13.2 % (ref 11.5–15.5)
WBC: 2.8 10*3/uL — ABNORMAL LOW (ref 4.0–10.5)
nRBC: 0 % (ref 0.0–0.2)

## 2022-07-31 LAB — HEPATITIS PANEL, ACUTE
HCV Ab: NONREACTIVE
Hep A IgM: NONREACTIVE
Hep B C IgM: NONREACTIVE
Hepatitis B Surface Ag: NONREACTIVE

## 2022-07-31 LAB — HIV ANTIBODY (ROUTINE TESTING W REFLEX): HIV Screen 4th Generation wRfx: NONREACTIVE

## 2022-08-01 LAB — KAPPA/LAMBDA LIGHT CHAINS
Kappa free light chain: 41.5 mg/L — ABNORMAL HIGH (ref 3.3–19.4)
Kappa, lambda light chain ratio: 1.13 (ref 0.26–1.65)
Lambda free light chains: 36.6 mg/L — ABNORMAL HIGH (ref 5.7–26.3)

## 2022-08-02 LAB — CMV DNA, QUANTITATIVE, PCR
CMV DNA Quant: NEGATIVE IU/mL
Log10 CMV Qn DNA Pl: UNDETERMINED log10 IU/mL

## 2022-08-02 LAB — EPSTEIN BARR VRS(EBV DNA BY PCR): EBV DNA QN by PCR: NEGATIVE IU/mL

## 2022-08-05 LAB — PROTEIN ELECTROPHORESIS, SERUM
A/G Ratio: 1.5 (ref 0.7–1.7)
Albumin ELP: 4 g/dL (ref 2.9–4.4)
Alpha-1-Globulin: 0.2 g/dL (ref 0.0–0.4)
Alpha-2-Globulin: 0.4 g/dL (ref 0.4–1.0)
Beta Globulin: 0.7 g/dL (ref 0.7–1.3)
Gamma Globulin: 1.4 g/dL (ref 0.4–1.8)
Globulin, Total: 2.7 g/dL (ref 2.2–3.9)
Total Protein ELP: 6.7 g/dL (ref 6.0–8.5)

## 2022-08-05 LAB — BCR-ABL1 FISH
Cells Analyzed: 200
Cells Counted: 200

## 2022-08-11 LAB — JAK2 V617F RFX CALR/MPL/E12-15

## 2022-08-11 LAB — CALR +MPL + E12-E15  (REFLEX)

## 2022-08-14 ENCOUNTER — Inpatient Hospital Stay: Payer: BC Managed Care – PPO | Admitting: Oncology

## 2022-08-14 ENCOUNTER — Encounter: Payer: Self-pay | Admitting: Oncology

## 2022-08-14 ENCOUNTER — Inpatient Hospital Stay: Payer: BC Managed Care – PPO

## 2022-08-14 VITALS — BP 117/90 | HR 80 | Temp 96.8°F | Resp 18 | Wt 188.7 lb

## 2022-08-14 DIAGNOSIS — L819 Disorder of pigmentation, unspecified: Secondary | ICD-10-CM

## 2022-08-14 DIAGNOSIS — I2699 Other pulmonary embolism without acute cor pulmonale: Secondary | ICD-10-CM | POA: Diagnosis not present

## 2022-08-14 DIAGNOSIS — J849 Interstitial pulmonary disease, unspecified: Secondary | ICD-10-CM

## 2022-08-14 DIAGNOSIS — D72810 Lymphocytopenia: Secondary | ICD-10-CM | POA: Diagnosis not present

## 2022-08-14 DIAGNOSIS — D6851 Activated protein C resistance: Secondary | ICD-10-CM | POA: Diagnosis not present

## 2022-08-14 DIAGNOSIS — R161 Splenomegaly, not elsewhere classified: Secondary | ICD-10-CM

## 2022-08-14 DIAGNOSIS — E538 Deficiency of other specified B group vitamins: Secondary | ICD-10-CM

## 2022-08-14 LAB — LACTATE DEHYDROGENASE: LDH: 137 U/L (ref 98–192)

## 2022-08-14 LAB — FOLATE: Folate: 5 ng/mL — ABNORMAL LOW (ref >5.9)

## 2022-08-14 MED ORDER — MUPIROCIN 2 % EX OINT: 1.0000 | TOPICAL_OINTMENT | Freq: Three times a day (TID) | CUTANEOUS | 0 refills | Status: AC

## 2022-08-14 NOTE — Assessment & Plan Note (Addendum)
Multifactorial, possibly secondary to medication side effect, autoimmune disease and vitamin B12 deficiency.  Continue B12 supplementation.  Check folate level.

## 2022-08-14 NOTE — Progress Notes (Signed)
Hematology/Oncology Progress note [phone] [phone]  Reason for visit Follow-up for PE and chronic anticoagulation.  Splenomegaly  ASSESSMENT & PLAN:   Pulmonary embolus (HCC)  #History of pulmonary embolism due to OCP use, interstitial lung disease, heterozygous factor V Leiden mutation Currently on Xarelto 10 mg for prophylaxis daily for prophylaxis.  She tolerates well.   Heterozygous factor V Leiden mutation (HCC) On anticoagulation prophylaxis.   Interstitial lung disorders (HCC) # NSIP (nonspecific interstitial pneumonitis)-patient follows up with rheumatologist   Lymphocytopenia Multifactorial, possibly secondary to medication side effect, autoimmune disease and vitamin B12 deficiency.  Continue B12 supplementation.  Check folate level.   Splenomegaly She has no constitutional symptoms. Workup showed JAK2 V617F mutation negative, with reflex to other mutations CALR, MPL, JAK 2 Ex 12-15 mutations negative.  Negative BCR-ABL FISH, negative EBV, CMV. Check peripheral blood flow cytometry. Borderline splenomegaly dated back to November 2023. Repeat ultrasound in 3 to 4 months.  Observation.  Folate deficiency Recommend folic acid supplementation.  Vitamin B12 deficiency Recommend B12 supplementation.   Orders Placed This Encounter  Procedures   US SPLEEN (ABDOMEN LIMITED)    Standing Status:   Future    Standing Expiration Date:   08/14/2023    Order Specific Question:   Reason for Exam (SYMPTOM  OR DIAGNOSIS REQUIRED)    Answer:   spenomegaly    Order Specific Question:   Preferred imaging location?    Answer:   [facility]   Flow cytometry panel-leukemia/lymphoma work-up    Standing Status:   Future    Number of Occurrences:   1    Standing Expiration Date:   08/14/2023   Lactate dehydrogenase    Standing Status:   Future    Number of Occurrences:   1    Standing Expiration Date:   08/14/2023   Folate    Standing Status:    Future    Number of Occurrences:   1    Standing Expiration Date:   08/14/2023   CBC with Differential (Cancer Center Only)    Standing Status:   Future    Standing Expiration Date:   08/14/2023   CMP (Cancer Center only)    Standing Status:   Future    Standing Expiration Date:   08/14/2023   Follow up in 6 months.  All questions were answered. The patient knows to call the clinic with any problems, questions or concerns.  Rickard Patience, MD, PhD [facility] Hematology Oncology 08/14/2022    HISTORY OF PRESENTING ILLNESS:  Pamela Mills 42 y.o.  female with past medical history listed as below who  follow-up for unprovoked pulmonary embolism and  anticoagulation. Since  November 2017, patient started to experience persistent dry cough, exertional shortness of breath, progressive swelling in hands and feet, she was referred to see rheumatology. Workup showed seropositive rheumatoid arthritis, Negative anti-CCP but elevated RF Patient was  initially prednisone and later started on methotrexate weekly (started on 08/15/2016) for treatment.. Patient's respiratory symptoms improved and then worsen again. At that point she was referred by her primary care to see Dr. Meredeth Ide pulmonary for evaluation. CT without contrast on 07/30/2016 showed basilar and apparent from predominant ground glass opacities in the architectural distortion, with possible minimum lower lobe bronchiectasis suggesting interstitial lung disease. There is also mild the bilateral axillary, subpectoral, supraclavicular, mediastinal adenopathy.lymph nodes are increased in number, mildly enlarged. Largest left axillary measuring 11 mm short axis. Findings favor reactive/systemic process with autoimmune disorders.  Patient's rheumatology  symptoms improved with treatment. Patient had wax and wane of her respiratory symptoms. In the summer, she has had 2 round trips of 8 hour car ride back and forth to [place] where her father lives. Most recent one  was on August 5. She has felt lower extremity swelling for which she has to put on stocking to reduce swelling. Also she felt a little stretchy feeling on her cough. She mentioned to Dr. Meredeth Ide who ordered a follow-up CT scan. CT without contrast chest on 11/26/2016 showed patchy groundglass in the septal thickening in both lower lobes, less severe than 07/27/2016. There is new rounded consolidation in the left lower lobe, possible infectious etiology. No definitive evidence of interstitial lung disease. Meanwhile her respiratory symptoms has become worse. She has experienced coughing, exertional shortness of breath, and mild hemoptysis. This triggered a d-dimer testing which came back high. Patient had CT anginal chest PE protocol on 11/29/2016 which showed acute appearing nonconclusive pulmonary emboli within several segmental pulmonary artery branches to the left lower lobe. Small pleural-based consolidation with the posterior aspect of the left lower lobe presumably associated focal pulmonary infarction. Associated small left pleural effusion. No evidence of associated right heart straining. Questionable focal low-density pulmonary embolus within the segmental pulmonary artery branch to the right middle lobe.  lower extremity ultrasound revealed no DVT within the right lower extremity. Patient was started on Eliquis 5 mg twice a day.  Patient has had some rheumatology/hypercoagulable workup done with [facility]. Reviewing her labs showed on 08/08/2016, she is tested negative for anti-Cardiolite pain antibody IgG < 9, IgM slightly elevated at 15. Beta-2 glycoprotein IgG and IgM normal, normal axial coronal phase phospholipid, DVvT normal, negative lupus anticoagulant.. Serum protein electrophoresis negative for M spike.  11/29/2016, homocystine 8, lupus anticoagulant was retested was negative, mildly low protein S activity at 48%, normal protein S antigen. Slightly low protein S antigen. Normal  anti-thrombin function. She is tested positive for single R506Q mutation heterozygous factor V Leiden mutation.  Patient has a family history of ovarian cancer in her mom. She reports that she was tested negative for genetic test.  She takes OCP since she was teenage.  This is her fist episode of blood clot. She was taking of OCP and referred to see gynecology.  # 06/13/2017 Antiphospholipid panel was checked at last visit again.  Which showed negative panel except slightly elevated anti cardiolipin IgM at 15.  Repeat protein S activity was 75%, protein S antigen at 52%, mildly decreased.  slight positive anti cardiolipin IgM does not meet criteria for Antiphospholipid syndrome 02/02/18 repeat protein antigen was normal at 62%.  08/04/2018, decrease to Eliquis 2.5 mg twice daily. 04/14/2020, switched to Xarelto 10 mg daily due to lack of insurance coverage for Eliquis July/August 2022 Switched to Lovenox 40mg  daily with pregnancy planning     NSIP (nonspecific interstitial pneumonitis)-patient follows up with rheumatologist and was  treated with CellCept and sulfasalazine.  INTERVAL HISTORY Pamela Mills is a 42 y.o. female who has above history reviewed by me today presents for follow-up management of pulmonary embolism.  Tolerates Xarelto well.  No bleeding events.  Recently she noticed left middle toe discoloration around nail, tenderness for 2 months. She has follow-up appointment with rheumatologist for NSIP management. on Azathioprine  Patient was recently diagnosed with possible lupus due to decreased C3 and C4 and a positive dsDNA.   Review of Systems  Constitutional:  Negative for appetite change, chills, fatigue and fever.  HENT:   Negative for hearing loss and voice change.   Eyes:  Negative for eye problems.  Respiratory:  Negative for chest tightness and cough.   Cardiovascular:  Negative for chest pain.  Gastrointestinal:  Negative for abdominal distention, abdominal pain and  blood in stool.  Endocrine: Negative for hot flashes.  Genitourinary:  Negative for difficulty urinating and frequency.   Musculoskeletal:  Negative for arthralgias.  Skin:  Negative for itching and rash.  Neurological:  Negative for extremity weakness, headaches and light-headedness.  Hematological:  Negative for adenopathy.  Psychiatric/Behavioral:  Negative for confusion.   Left middle toe discoloration and pain.  MEDICAL HISTORY:  Past Medical History:  Diagnosis Date   Anxiety    Arthritis    Basal cell carcinoma    Post neck - treated years ago    Cancer (HCC)    skin   Collagen vascular disease (HCC)    Depression    Diverticulitis    Dysplastic nevus 05/27/2019   R distal ant thigh - moderate   Factor 5 Leiden mutation, heterozygous (HCC) 2018   GERD (gastroesophageal reflux disease)    Migraines    Preterm labor    Pulmonary embolism (HCC)    Vaginal Pap smear, abnormal     SURGICAL HISTORY: Past Surgical History:  Procedure Laterality Date   CHOLECYSTECTOMY N/A 06/29/2015   Procedure: LAPAROSCOPIC CHOLECYSTECTOMY;  Surgeon: Almond Lint, MD;  Location: [facility];  Service: General;  Laterality: N/A;   CHROMOPERTUBATION Right 10/17/2017   Procedure: CHROMOPERTUBATION;  Surgeon: Ward, Elenora Fender, MD;  Location: [facility];  Service: Gynecology;  Laterality: Right;   DILATION AND CURETTAGE OF UTERUS  2016   DILATION AND EVACUATION N/A 11/10/2014   Procedure: DILATATION AND EVACUATION;  Surgeon: Elenora Fender Ward, MD;  Location: [facility];  Service: Gynecology;  Laterality: N/A;   FLEXIBLE BRONCHOSCOPY N/A 02/18/2018   Procedure: FLEXIBLE BRONCHOSCOPY;  Surgeon: Vida Rigger, MD;  Location: [facility];  Service: Thoracic;  Laterality: N/A;   FOOT SURGERY Left 01/2012   FOOT SURGERY Left 2013   Plantar Fasiciatis   LAPAROSCOPIC ENDOMETRIOSIS FULGURATION  2006   LAPAROSCOPIC UNILATERAL SALPINGO OOPHERECTOMY Left 10/17/2017   Procedure: LAPAROSCOPIC  UNILATERAL SALPINGO OOPHORECTOMY;  Surgeon: Ward, Elenora Fender, MD;  Location: [facility];  Service: Gynecology;  Laterality: Left;   LAPAROSCOPY N/A 10/17/2017   Procedure: LAPAROSCOPY OPERATIVE, Olin Pia;  Surgeon: Ward, Elenora Fender, MD;  Location: [facility];  Service: Gynecology;  Laterality: N/A;   oophotectomy Left 09/2017   2 cysts on the same side also removed   UPPER GI ENDOSCOPY      SOCIAL HISTORY: Social History   Socioeconomic History   Marital status: Married    Spouse name: Ricky   Number of children: Not on file   Years of education: Not on file   Highest education level: Not on file  Occupational History   Not on file  Tobacco Use   Smoking status: Never   Smokeless tobacco: Never  Vaping Use   Vaping Use: Never used  Substance and Sexual Activity   Alcohol use: No   Drug use: No   Sexual activity: Not Currently  Other Topics Concern   Not on file  Social History Narrative   Not on file   Social Determinants of Health   Financial Resource Strain: Not on file  Food Insecurity: Not on file  Transportation Needs: Not on file  Physical Activity: Not on file  Stress: Not on  file  Social Connections: Not on file  Intimate Partner Violence: Not on file    FAMILY HISTORY: Family History  Problem Relation Age of Onset   Ovarian cancer Mother 29   Rheumatologic disease Maternal Aunt    Pancreatic cancer Maternal Grandfather    Colon cancer Paternal Grandmother    Breast cancer Neg Hx     ALLERGIES:  is allergic to other and gluten meal.  MEDICATIONS:  Current Outpatient Medications  Medication Sig Dispense Refill   acetaminophen (TYLENOL) 500 MG tablet Take 1,000 mg by mouth every 8 (eight) hours as needed (pain).     Azathioprine 75 MG TABS Take 1 tablet by mouth 2 (two) times daily.     cetirizine (ZYRTEC) 10 MG tablet Take 10 mg by mouth daily.     Cholecalciferol (VITAMIN D3) 1000 units CAPS Take 1,000 Units by mouth daily.     escitalopram  (LEXAPRO) 5 MG tablet Take 15 mg by mouth every evening.      folic acid (FOLVITE) 1 MG tablet Take 1 mg by mouth daily.  11   Galcanezumab-gnlm 120 MG/ML SOAJ Inject 120 mg into the skin every 28 (twenty-eight) days.     HUMIRA PEN 40 MG/0.4ML PNKT SMARTSIG:40 Milligram(s) SUB-Q Every 2 Weeks     metFORMIN (GLUCOPHAGE-XR) 500 MG 24 hr tablet Take 1,000 mg by mouth daily.     mupirocin ointment (BACTROBAN) 2 % Apply 1 Application topically 3 (three) times daily. 22 g 0   omeprazole (PRILOSEC) 20 MG capsule Take 20 mg by mouth daily.      pregabalin (LYRICA) 150 MG capsule Take 150 mg by mouth 2 (two) times daily.     rivaroxaban (XARELTO) 10 MG TABS tablet Take 1 tablet (10 mg total) by mouth daily. 90 tablet 1   triamcinolone (NASACORT) 55 MCG/ACT AERO nasal inhaler Place 2 sprays into the nose daily.     vitamin B-12 (CYANOCOBALAMIN) 1000 MCG tablet Take 1,000 mcg by mouth daily.     LORazepam (ATIVAN) 0.5 MG tablet Take 0.5 mg by mouth 2 (two) times daily as needed for anxiety.  (Patient not taking: Reported on 05/02/2021)     SAXENDA 18 MG/3ML SOPN Inject into the skin. (Patient not taking: Reported on 04/18/2022)     Semaglutide-Weight Management (WEGOVY) 0.25 MG/0.5ML SOAJ Inject into the skin. (Patient not taking: Reported on 08/14/2022)     SUMAtriptan (IMITREX) 50 MG tablet Take 50 mg by mouth every 2 (two) hours as needed for migraine.  (Patient not taking: Reported on 05/02/2021)     topiramate (TOPAMAX) 25 MG tablet Take 50 mg by mouth daily.  (Patient not taking: Reported on 05/02/2021)     No current facility-administered medications for this visit.      Marland Kitchen  PHYSICAL EXAMINATION: ECOG PERFORMANCE STATUS: 1 - Symptomatic but completely ambulatory Vitals:   08/14/22 1433  BP: (!) 117/90  Pulse: 80  Resp: 18  Temp: (!) 96.8 F (36 C)  SpO2: 100%   Filed Weights   08/14/22 1433  Weight: 188 lb 11.2 oz (85.6 kg)   Physical Exam Constitutional:      General: She is not in acute  distress.    Appearance: She is obese.  HENT:     Head: Normocephalic.     Mouth/Throat:     Pharynx: No oropharyngeal exudate.  Eyes:     General: No scleral icterus.    Pupils: Pupils are equal, round, and reactive to light.  Cardiovascular:  Rate and Rhythm: Normal rate and regular rhythm.  Pulmonary:     Effort: Pulmonary effort is normal. No respiratory distress.     Breath sounds: Normal breath sounds. No wheezing.  Abdominal:     General: There is no distension.  Musculoskeletal:        General: Normal range of motion.     Cervical back: Normal range of motion and neck supple.     Comments: left middle toe discoloration/tenderness around toenail  Lymphadenopathy:     Cervical: No cervical adenopathy.  Skin:    General: Skin is warm and dry.     Findings: No erythema.  Neurological:     Mental Status: She is alert and oriented to person, place, and time. Mental status is at baseline.     Cranial Nerves: No cranial nerve deficit.     Motor: No abnormal muscle tone.     Coordination: Coordination normal.  Psychiatric:        Mood and Affect: Mood and affect normal.   Will complete for new    RADIOGRAPHIC STUDIES: I have personally reviewed the radiological images as listed and agreed with the findings in the report. MM 3D DIAGNOSTIC MAMMOGRAM BILATERAL BREAST  Result Date: 07/15/2022 CLINICAL DATA:  Patient for 1 year follow-up of bilateral breast masses. EXAM: DIGITAL DIAGNOSTIC BILATERAL MAMMOGRAM WITH TOMOSYNTHESIS; ULTRASOUND LEFT BREAST LIMITED; ULTRASOUND RIGHT BREAST LIMITED TECHNIQUE: Bilateral digital diagnostic mammography and breast tomosynthesis was performed.; Targeted ultrasound examination of the left breast was performed.; Targeted ultrasound examination of the right breast was performed COMPARISON:  Previous exam(s). ACR Breast Density Category c: The breasts are heterogeneously dense, which may obscure small masses. FINDINGS: Similar obscured mass  within the outer aspect of the right breast and outer aspect of the left breast. No new masses, calcifications or nonsurgical distortion identified within either breast. Targeted ultrasound is performed, showing a stable 10 x 6 x 9 mm cyst right breast 10 o'clock position 4 cm from the nipple. There is a stable 8 x 4 x 8 mm probable cluster of cysts left breast 2 o'clock position 5 cm from the nipple. Previously described mass within the left breast 3 o'clock position 4 cm from nipple is no longer visualized. IMPRESSION: Stable probably benign bilateral breast masses. The left breast mass previously described at the 3 o'clock position is no longer visualized, potentially resolved in the interval. RECOMMENDATION: Bilateral diagnostic mammography and breast ultrasound in 12 months. I have discussed the findings and recommendations with the patient. If applicable, a reminder letter will be sent to the patient regarding the next appointment. BI-RADS CATEGORY  3: Probably benign. Electronically Signed   By: Annia Belt M.D.   On: 07/15/2022 16:25  [place] LIMITED ULTRASOUND INCLUDING AXILLA LEFT BREAST   Result Date: 07/15/2022 CLINICAL DATA:  Patient for 1 year follow-up of bilateral breast masses. EXAM: DIGITAL DIAGNOSTIC BILATERAL MAMMOGRAM WITH TOMOSYNTHESIS; ULTRASOUND LEFT BREAST LIMITED; ULTRASOUND RIGHT BREAST LIMITED TECHNIQUE: Bilateral digital diagnostic mammography and breast tomosynthesis was performed.; Targeted ultrasound examination of the left breast was performed.; Targeted ultrasound examination of the right breast was performed COMPARISON:  Previous exam(s). ACR Breast Density Category c: The breasts are heterogeneously dense, which may obscure small masses. FINDINGS: Similar obscured mass within the outer aspect of the right breast and outer aspect of the left breast. No new masses, calcifications or nonsurgical distortion identified within either breast. Targeted ultrasound is performed, showing a  stable 10 x 6 x 9 mm cyst right breast  10 o'clock position 4 cm from the nipple. There is a stable 8 x 4 x 8 mm probable cluster of cysts left breast 2 o'clock position 5 cm from the nipple. Previously described mass within the left breast 3 o'clock position 4 cm from nipple is no longer visualized. IMPRESSION: Stable probably benign bilateral breast masses. The left breast mass previously described at the 3 o'clock position is no longer visualized, potentially resolved in the interval. RECOMMENDATION: Bilateral diagnostic mammography and breast ultrasound in 12 months. I have discussed the findings and recommendations with the patient. If applicable, a reminder letter will be sent to the patient regarding the next appointment. BI-RADS CATEGORY  3: Probably benign. Electronically Signed   By: Annia Belt M.D.   On: 07/15/2022 16:25  [place] LIMITED ULTRASOUND INCLUDING AXILLA RIGHT BREAST  Result Date: 07/15/2022 CLINICAL DATA:  Patient for 1 year follow-up of bilateral breast masses. EXAM: DIGITAL DIAGNOSTIC BILATERAL MAMMOGRAM WITH TOMOSYNTHESIS; ULTRASOUND LEFT BREAST LIMITED; ULTRASOUND RIGHT BREAST LIMITED TECHNIQUE: Bilateral digital diagnostic mammography and breast tomosynthesis was performed.; Targeted ultrasound examination of the left breast was performed.; Targeted ultrasound examination of the right breast was performed COMPARISON:  Previous exam(s). ACR Breast Density Category c: The breasts are heterogeneously dense, which may obscure small masses. FINDINGS: Similar obscured mass within the outer aspect of the right breast and outer aspect of the left breast. No new masses, calcifications or nonsurgical distortion identified within either breast. Targeted ultrasound is performed, showing a stable 10 x 6 x 9 mm cyst right breast 10 o'clock position 4 cm from the nipple. There is a stable 8 x 4 x 8 mm probable cluster of cysts left breast 2 o'clock position 5 cm from the nipple. Previously described mass  within the left breast 3 o'clock position 4 cm from nipple is no longer visualized. IMPRESSION: Stable probably benign bilateral breast masses. The left breast mass previously described at the 3 o'clock position is no longer visualized, potentially resolved in the interval. RECOMMENDATION: Bilateral diagnostic mammography and breast ultrasound in 12 months. I have discussed the findings and recommendations with the patient. If applicable, a reminder letter will be sent to the patient regarding the next appointment. BI-RADS CATEGORY  3: Probably benign. Electronically Signed   By: Annia Belt M.D.   On: 07/15/2022 16:25

## 2022-08-14 NOTE — Assessment & Plan Note (Signed)
On anticoagulation prophylaxis.  

## 2022-08-14 NOTE — Assessment & Plan Note (Addendum)
#  History of pulmonary embolism due to OCP use, interstitial lung disease, heterozygous factor V Leiden mutation Currently on Xarelto 10 mg for prophylaxis daily for prophylaxis.  She tolerates well.

## 2022-08-14 NOTE — Assessment & Plan Note (Signed)
Recommend folic acid supplementation

## 2022-08-14 NOTE — Assessment & Plan Note (Addendum)
#   NSIP (nonspecific interstitial pneumonitis)-patient follows up with rheumatologist 

## 2022-08-14 NOTE — Assessment & Plan Note (Signed)
Possibly due to Paronychia Recommends Bactroban antibiotics topically.  Recommend loose footwear  monitor symptoms.

## 2022-08-14 NOTE — Assessment & Plan Note (Signed)
She has no constitutional symptoms. Workup showed JAK2 V617F mutation negative, with reflex to other mutations CALR, MPL, JAK 2 Ex 12-15 mutations negative.  Negative BCR-ABL FISH, negative EBV, CMV. Check peripheral blood flow cytometry. Borderline splenomegaly dated back to November 2023. Repeat ultrasound in 3 to 4 months.  Observation.

## 2022-08-14 NOTE — Assessment & Plan Note (Signed)
Recommend B12 supplementation 

## 2022-08-16 LAB — COMP PANEL: LEUKEMIA/LYMPHOMA

## 2022-09-19 ENCOUNTER — Ambulatory Visit: Payer: BC Managed Care – PPO | Admitting: Dermatology

## 2022-09-19 VITALS — BP 107/78

## 2022-09-19 DIAGNOSIS — L814 Other melanin hyperpigmentation: Secondary | ICD-10-CM | POA: Diagnosis not present

## 2022-09-19 DIAGNOSIS — M069 Rheumatoid arthritis, unspecified: Secondary | ICD-10-CM

## 2022-09-19 DIAGNOSIS — L821 Other seborrheic keratosis: Secondary | ICD-10-CM | POA: Diagnosis not present

## 2022-09-19 DIAGNOSIS — L578 Other skin changes due to chronic exposure to nonionizing radiation: Secondary | ICD-10-CM | POA: Diagnosis not present

## 2022-09-19 DIAGNOSIS — D229 Melanocytic nevi, unspecified: Secondary | ICD-10-CM

## 2022-09-19 DIAGNOSIS — Z79899 Other long term (current) drug therapy: Secondary | ICD-10-CM

## 2022-09-19 DIAGNOSIS — Z7189 Other specified counseling: Secondary | ICD-10-CM

## 2022-09-19 DIAGNOSIS — L409 Psoriasis, unspecified: Secondary | ICD-10-CM

## 2022-09-19 DIAGNOSIS — Z86018 Personal history of other benign neoplasm: Secondary | ICD-10-CM

## 2022-09-19 DIAGNOSIS — Z85828 Personal history of other malignant neoplasm of skin: Secondary | ICD-10-CM

## 2022-09-19 DIAGNOSIS — L930 Discoid lupus erythematosus: Secondary | ICD-10-CM

## 2022-09-19 DIAGNOSIS — T691XXD Chilblains, subsequent encounter: Secondary | ICD-10-CM

## 2022-09-19 DIAGNOSIS — Z1283 Encounter for screening for malignant neoplasm of skin: Secondary | ICD-10-CM

## 2022-09-19 MED ORDER — TRIAMCINOLONE ACETONIDE 0.1 % EX LOTN: 1.0000 | TOPICAL_LOTION | CUTANEOUS | 4 refills | Status: DC

## 2022-09-19 NOTE — Progress Notes (Signed)
Follow-Up Visit   Subjective  Pamela Mills is a 42 y.o. female who presents for the following: Skin Cancer Screening and Full Body Skin Exam, hx of BCC, Dysplastic Nevus, check spot L cheek, ~ yr, [place] L 2nd toe Clobetasol cr bid, check scalp, using TMC oint prn, recent Lupus diagnosis The patient presents for Total-Body Skin Exam (TBSE) for skin cancer screening and mole check. The patient has spots, moles and lesions to be evaluated, some may be new or changing and the patient has concerns that these could be cancer.  The following portions of the chart were reviewed this encounter and updated as appropriate: medications, allergies, medical history  Review of Systems:  No other skin or systemic complaints except as noted in HPI or Assessment and Plan.  Objective  Well appearing patient in no apparent distress; mood and affect are within normal limits.  A full examination was performed including scalp, head, eyes, ears, nose, lips, neck, chest, axillae, abdomen, back, buttocks, bilateral upper extremities, bilateral lower extremities, hands, feet, fingers, toes, fingernails, and toenails. All findings within normal limits unless otherwise noted below.   Relevant physical exam findings are noted in the Assessment and Plan.   Assessment & Plan   LENTIGINES, SEBORRHEIC KERATOSES, HEMANGIOMAS - Benign normal skin lesions - Benign-appearing - Call for any changes  MELANOCYTIC NEVI - Tan-brown and/or pink-flesh-colored symmetric macules and papules - Benign appearing on exam today - Observation - Call clinic for new or changing moles - Recommend daily use of broad spectrum spf 30+ sunscreen to sun-exposed areas.   ACTINIC DAMAGE - Chronic condition, secondary to cumulative UV/sun exposure - diffuse scaly erythematous macules with underlying dyspigmentation - Recommend daily broad spectrum sunscreen SPF 30+ to sun-exposed areas, reapply every 2 hours as needed.  - Staying in the  shade or wearing long sleeves, sun glasses (UVA+UVB protection) and wide brim hats (4-inch brim around the entire circumference of the hat) are also recommended for sun protection.  - Call for new or changing lesions.  SKIN CANCER SCREENING PERFORMED TODAY.  HISTORY OF BASAL CELL CARCINOMA OF THE SKIN - No evidence of recurrence today - Recommend regular full body skin exams - Recommend daily broad spectrum sunscreen SPF 30+ to sun-exposed areas, reapply every 2 hours as needed.  - Call if any new or changing lesions are noted between office visits  - Posterior neck  HISTORY OF DYSPLASTIC NEVUS No evidence of recurrence today Recommend regular full body skin exams Recommend daily broad spectrum sunscreen SPF 30+ to sun-exposed areas, reapply every 2 hours as needed.  Call if any new or changing lesions are noted between office visits  - R distal ant thigh  LUPUS with CHILBLAINS Exam: L 2nd toe DIP joint area tender pink plaque Treatment Plan: Cont Clobetasol cr as directed by rheumatology  Cont f/u with Rheumatology  VASCULAR BIRTHMARK Exam: post scalp with violaceous patch Treatment Plan: Benign-appearing.  Observation.  Call clinic for new or changing lesions.  Recommend daily use of broad spectrum spf 30+ sunscreen to sun-exposed areas.     RHEUMATOID ARTHRITIS Treatment Plan: Cont Humira as prescribed by rheumatology Cont f/u with rheumatology  PSORIASIS scalp Exam: scalp clear today 0% BSA. Psoriasis is a chronic non-curable, but treatable genetic/hereditary disease that may have other systemic features affecting other organ systems such as joints (Psoriatic Arthritis). It is associated with an increased risk of inflammatory bowel disease, heart disease, non-alcoholic fatty liver disease, and depression.  Treatments include light and  laser treatments; topical medications; and systemic medications including oral and injectables. Treatment Plan: Start TMC 0.1% lotion 5d/wk  aa scalp prn flares, avoid f/g/a  D/c TMC 0.1% ointment  Topical steroids (such as triamcinolone, fluocinolone, fluocinonide, mometasone, clobetasol, halobetasol, betamethasone, hydrocortisone) can cause thinning and lightening of the skin if they are used for too long in the same area. Your physician has selected the right strength medicine for your problem and area affected on the body. Please use your medication only as directed by your physician to prevent side effects.    Return in about 1 year (around 09/19/2023) for TBSE, Hx of BCC, Hx of Dysplastic nevi.  I, Ardis Rowan, RMA, am acting as scribe for Armida Sans, MD .  Documentation: I have reviewed the above documentation for accuracy and completeness, and I agree with the above.  Armida Sans, MD

## 2022-09-19 NOTE — Patient Instructions (Addendum)

## 2022-09-21 ENCOUNTER — Encounter: Payer: Self-pay | Admitting: Dermatology

## 2022-10-17 ENCOUNTER — Other Ambulatory Visit: Payer: BC Managed Care – PPO

## 2022-10-17 ENCOUNTER — Ambulatory Visit: Payer: BC Managed Care – PPO | Admitting: Oncology

## 2022-12-18 ENCOUNTER — Inpatient Hospital Stay: Payer: BC Managed Care – PPO | Attending: Oncology

## 2022-12-18 ENCOUNTER — Inpatient Hospital Stay: Payer: BC Managed Care – PPO | Admitting: Oncology

## 2022-12-18 ENCOUNTER — Encounter: Payer: Self-pay | Admitting: Oncology

## 2022-12-18 VITALS — BP 122/93 | HR 75 | Temp 96.1°F | Resp 18 | Wt 199.7 lb

## 2022-12-18 DIAGNOSIS — J849 Interstitial pulmonary disease, unspecified: Secondary | ICD-10-CM | POA: Insufficient documentation

## 2022-12-18 DIAGNOSIS — Z800 Family history of malignant neoplasm of digestive organs: Secondary | ICD-10-CM | POA: Diagnosis not present

## 2022-12-18 DIAGNOSIS — Z8041 Family history of malignant neoplasm of ovary: Secondary | ICD-10-CM | POA: Diagnosis not present

## 2022-12-18 DIAGNOSIS — E538 Deficiency of other specified B group vitamins: Secondary | ICD-10-CM

## 2022-12-18 DIAGNOSIS — I2699 Other pulmonary embolism without acute cor pulmonale: Secondary | ICD-10-CM

## 2022-12-18 DIAGNOSIS — D6851 Activated protein C resistance: Secondary | ICD-10-CM | POA: Insufficient documentation

## 2022-12-18 DIAGNOSIS — R161 Splenomegaly, not elsewhere classified: Secondary | ICD-10-CM | POA: Insufficient documentation

## 2022-12-18 DIAGNOSIS — D72810 Lymphocytopenia: Secondary | ICD-10-CM | POA: Diagnosis not present

## 2022-12-18 DIAGNOSIS — Z7901 Long term (current) use of anticoagulants: Secondary | ICD-10-CM | POA: Insufficient documentation

## 2022-12-18 LAB — CMP (CANCER CENTER ONLY)
ALT: 14 U/L (ref 0–44)
AST: 22 U/L (ref 15–41)
Albumin: 3.7 g/dL (ref 3.5–5.0)
Alkaline Phosphatase: 40 U/L (ref 38–126)
Anion gap: 5 (ref 5–15)
BUN: 13 mg/dL (ref 6–20)
CO2: 22 mmol/L (ref 22–32)
Calcium: 8.7 mg/dL — ABNORMAL LOW (ref 8.9–10.3)
Chloride: 107 mmol/L (ref 98–111)
Creatinine: 0.67 mg/dL (ref 0.44–1.00)
GFR, Estimated: >60 mL/min (ref >60)
Glucose, Bld: 100 mg/dL — ABNORMAL HIGH (ref 70–99)
Potassium: 3.6 mmol/L (ref 3.5–5.1)
Sodium: 134 mmol/L — ABNORMAL LOW (ref 135–145)
Total Bilirubin: 0.6 mg/dL (ref 0.3–1.2)
Total Protein: 6.8 g/dL (ref 6.5–8.1)

## 2022-12-18 LAB — CBC WITH DIFFERENTIAL (CANCER CENTER ONLY)
Abs Immature Granulocytes: 0 10*3/uL (ref 0.00–0.07)
Basophils Absolute: 0 10*3/uL (ref 0.0–0.1)
Basophils Relative: 1 %
Eosinophils Absolute: 0.1 10*3/uL (ref 0.0–0.5)
Eosinophils Relative: 5 %
HCT: 37.2 % (ref 36.0–46.0)
Hemoglobin: 12.9 g/dL (ref 12.0–15.0)
Immature Granulocytes: 0 %
Lymphocytes Relative: 9 %
Lymphs Abs: 0.3 10*3/uL — ABNORMAL LOW (ref 0.7–4.0)
MCH: 31.9 pg (ref 26.0–34.0)
MCHC: 34.7 g/dL (ref 30.0–36.0)
MCV: 92.1 fL (ref 80.0–100.0)
Monocytes Absolute: 0.2 10*3/uL (ref 0.1–1.0)
Monocytes Relative: 6 %
Neutro Abs: 2.3 10*3/uL (ref 1.7–7.7)
Neutrophils Relative %: 79 %
Platelet Count: 181 10*3/uL (ref 150–400)
RBC: 4.04 MIL/uL (ref 3.87–5.11)
RDW: 12.9 % (ref 11.5–15.5)
WBC Count: 2.9 10*3/uL — ABNORMAL LOW (ref 4.0–10.5)
nRBC: 0 % (ref 0.0–0.2)

## 2022-12-18 LAB — FOLATE: Folate: 16.6 ng/mL (ref >5.9)

## 2022-12-18 LAB — VITAMIN B12: Vitamin B-12: 311 pg/mL (ref 180–914)

## 2022-12-18 MED ORDER — RIVAROXABAN 10 MG PO TABS: 10.0000 mg | ORAL_TABLET | Freq: Every day | ORAL | 1 refills | Status: DC

## 2022-12-18 NOTE — Assessment & Plan Note (Signed)
On anticoagulation prophylaxis.

## 2022-12-18 NOTE — Progress Notes (Signed)
Hematology/Oncology Progress note [phone] [phone]  Reason for visit Follow-up for PE and chronic anticoagulation.  Splenomegaly  ASSESSMENT & PLAN:   Pulmonary embolus (HCC)  #History of pulmonary embolism due to OCP use, interstitial lung disease, heterozygous factor V Leiden mutation continue Xarelto 10 mg for prophylaxis daily for prophylaxis.  She tolerates well.   Heterozygous factor V Leiden mutation (HCC) On anticoagulation prophylaxis.   Folate deficiency Folate level is increased.  Continue folic acid supplementation.  Interstitial lung disorders (HCC) # NSIP (nonspecific interstitial pneumonitis)-patient follows up with rheumatologist   Vitamin B12 deficiency B12 level has improved and normalzied. Continue oral B12 supplementation.  Splenomegaly She has no constitutional symptoms. Workup showed JAK2 V617F mutation negative, with reflex to other mutations CALR, MPL, JAK 2 Ex 12-15 mutations negative.  Negative BCR-ABL FISH, negative EBV, CMV. Check peripheral blood flow cytometry. Borderline splenomegaly dated back to November 2023. Repeat ultrasound of spleen.   Lymphocytopenia Multifactorial, possibly secondary to medication side effect, autoimmune disease and vitamin B12 deficiency.  Continue B12 and folate supplementation.    Orders Placed This Encounter  Procedures   US SPLEEN (ABDOMEN LIMITED)    Standing Status:   Future    Standing Expiration Date:   12/18/2023    Order Specific Question:   Reason for Exam (SYMPTOM  OR DIAGNOSIS REQUIRED)    Answer:   splenomegaly    Order Specific Question:   Preferred imaging location?    Answer:   [facility]   CBC with Differential (Cancer Center Only)    Standing Status:   Future    Standing Expiration Date:   12/18/2023   CMP (Cancer Center only)    Standing Status:   Future    Standing Expiration Date:   12/18/2023   Vitamin B12    Standing Status:   Future    Standing  Expiration Date:   12/18/2023   Folate    Standing Status:   Future    Standing Expiration Date:   12/18/2023   Follow up in 6 months.  All questions were answered. The patient knows to call the clinic with any problems, questions or concerns.  Rickard Patience, MD, PhD [facility] Hematology Oncology 12/18/2022    HISTORY OF PRESENTING ILLNESS:  Pamela Mills 42 y.o.  female with past medical history listed as below who  follow-up for unprovoked pulmonary embolism and  anticoagulation. Since  November 2017, patient started to experience persistent dry cough, exertional shortness of breath, progressive swelling in hands and feet, she was referred to see rheumatology. Workup showed seropositive rheumatoid arthritis, Negative anti-CCP but elevated RF Patient was  initially prednisone and later started on methotrexate weekly (started on 08/15/2016) for treatment.. Patient's respiratory symptoms improved and then worsen again. At that point she was referred by her primary care to see Dr. Meredeth Ide pulmonary for evaluation. CT without contrast on 07/30/2016 showed basilar and apparent from predominant ground glass opacities in the architectural distortion, with possible minimum lower lobe bronchiectasis suggesting interstitial lung disease. There is also mild the bilateral axillary, subpectoral, supraclavicular, mediastinal adenopathy.lymph nodes are increased in number, mildly enlarged. Largest left axillary measuring 11 mm short axis. Findings favor reactive/systemic process with autoimmune disorders.  Patient's rheumatology symptoms improved with treatment. Patient had wax and wane of her respiratory symptoms. In the summer, she has had 2 round trips of 8 hour car ride back and forth to [place] where her father lives. Most recent one was on August 5. She has  felt lower extremity swelling for which she has to put on stocking to reduce swelling. Also she felt a little stretchy feeling on her cough. She mentioned to Dr.  Meredeth Ide who ordered a follow-up CT scan. CT without contrast chest on 11/26/2016 showed patchy groundglass in the septal thickening in both lower lobes, less severe than 07/27/2016. There is new rounded consolidation in the left lower lobe, possible infectious etiology. No definitive evidence of interstitial lung disease. Meanwhile her respiratory symptoms has become worse. She has experienced coughing, exertional shortness of breath, and mild hemoptysis. This triggered a d-dimer testing which came back high. Patient had CT anginal chest PE protocol on 11/29/2016 which showed acute appearing nonconclusive pulmonary emboli within several segmental pulmonary artery branches to the left lower lobe. Small pleural-based consolidation with the posterior aspect of the left lower lobe presumably associated focal pulmonary infarction. Associated small left pleural effusion. No evidence of associated right heart straining. Questionable focal low-density pulmonary embolus within the segmental pulmonary artery branch to the right middle lobe.  lower extremity ultrasound revealed no DVT within the right lower extremity. Patient was started on Eliquis 5 mg twice a day.  Patient has had some rheumatology/hypercoagulable workup done with [facility]. Reviewing her labs showed on 08/08/2016, she is tested negative for anti-Cardiolite pain antibody IgG < 9, IgM slightly elevated at 15. Beta-2 glycoprotein IgG and IgM normal, normal axial coronal phase phospholipid, DVvT normal, negative lupus anticoagulant.. Serum protein electrophoresis negative for M spike.  11/29/2016, homocystine 8, lupus anticoagulant was retested was negative, mildly low protein S activity at 48%, normal protein S antigen. Slightly low protein S antigen. Normal anti-thrombin function. She is tested positive for single R506Q mutation heterozygous factor V Leiden mutation.  Patient has a family history of ovarian cancer in her mom. She reports that  she was tested negative for genetic test.  She takes OCP since she was teenage.  This is her fist episode of blood clot. She was taking of OCP and referred to see gynecology.  # 06/13/2017 Antiphospholipid panel was checked at last visit again.  Which showed negative panel except slightly elevated anti cardiolipin IgM at 15.  Repeat protein S activity was 75%, protein S antigen at 52%, mildly decreased.  slight positive anti cardiolipin IgM does not meet criteria for Antiphospholipid syndrome 02/02/18 repeat protein antigen was normal at 62%.  08/04/2018, decrease to Eliquis 2.5 mg twice daily. 04/14/2020, switched to Xarelto 10 mg daily due to lack of insurance coverage for Eliquis July/August 2022 Switched to Lovenox 40mg  daily with pregnancy planning     NSIP (nonspecific interstitial pneumonitis)-patient follows up with rheumatologist and was  treated with CellCept and sulfasalazine.  INTERVAL HISTORY CARMA SULLINS is a 42 y.o. female who has above history reviewed by me today presents for follow-up management of pulmonary embolism.  Tolerates Xarelto well.  No bleeding events.  She has follow-up appointment with rheumatologist for NSIP management. on Azathioprine  Off humira due to lack of insurance coverage.  She has tried adalimumab and not able to tolerate.  She is recommended to start Enbrel. She has experienced worsening of hand and feet joint pain, swelling/tightness   Review of Systems  Constitutional:  Negative for appetite change, chills, fatigue and fever.  HENT:   Negative for hearing loss and voice change.   Eyes:  Negative for eye problems.  Respiratory:  Negative for chest tightness and cough.   Cardiovascular:  Negative for chest pain.  Gastrointestinal:  Negative  for abdominal distention, abdominal pain and blood in stool.  Endocrine: Negative for hot flashes.  Genitourinary:  Negative for difficulty urinating and frequency.   Musculoskeletal:  Positive for  arthralgias.  Skin:  Negative for itching and rash.       Purplish discoloration of toes.   Neurological:  Negative for extremity weakness, headaches and light-headedness.  Hematological:  Negative for adenopathy.  Psychiatric/Behavioral:  Negative for confusion.     MEDICAL HISTORY:  Past Medical History:  Diagnosis Date   Anxiety    Arthritis    Basal cell carcinoma    Post neck - treated years ago    Cancer (HCC)    skin   Collagen vascular disease (HCC)    Depression    Diverticulitis    Dysplastic nevus 05/27/2019   R distal ant thigh - moderate   Factor 5 Leiden mutation, heterozygous (HCC) 2018   GERD (gastroesophageal reflux disease)    Migraines    Preterm labor    Pulmonary embolism (HCC)    Vaginal Pap smear, abnormal     SURGICAL HISTORY: Past Surgical History:  Procedure Laterality Date   CHOLECYSTECTOMY N/A 06/29/2015   Procedure: LAPAROSCOPIC CHOLECYSTECTOMY;  Surgeon: Almond Lint, MD;  Location: [facility];  Service: General;  Laterality: N/A;   CHROMOPERTUBATION Right 10/17/2017   Procedure: CHROMOPERTUBATION;  Surgeon: Ward, Elenora Fender, MD;  Location: [facility];  Service: Gynecology;  Laterality: Right;   DILATION AND CURETTAGE OF UTERUS  2016   DILATION AND EVACUATION N/A 11/10/2014   Procedure: DILATATION AND EVACUATION;  Surgeon: Elenora Fender Ward, MD;  Location: [facility];  Service: Gynecology;  Laterality: N/A;   FLEXIBLE BRONCHOSCOPY N/A 02/18/2018   Procedure: FLEXIBLE BRONCHOSCOPY;  Surgeon: Vida Rigger, MD;  Location: [facility];  Service: Thoracic;  Laterality: N/A;   FOOT SURGERY Left 01/2012   FOOT SURGERY Left 2013   Plantar Fasiciatis   LAPAROSCOPIC ENDOMETRIOSIS FULGURATION  2006   LAPAROSCOPIC UNILATERAL SALPINGO OOPHERECTOMY Left 10/17/2017   Procedure: LAPAROSCOPIC UNILATERAL SALPINGO OOPHORECTOMY;  Surgeon: Ward, Elenora Fender, MD;  Location: [facility];  Service: Gynecology;  Laterality: Left;   LAPAROSCOPY N/A 10/17/2017    Procedure: LAPAROSCOPY OPERATIVE, Olin Pia;  Surgeon: Ward, Elenora Fender, MD;  Location: [facility];  Service: Gynecology;  Laterality: N/A;   oophotectomy Left 09/2017   2 cysts on the same side also removed   UPPER GI ENDOSCOPY      SOCIAL HISTORY: Social History   Socioeconomic History   Marital status: Married    Spouse name: Ricky   Number of children: Not on file   Years of education: Not on file   Highest education level: Not on file  Occupational History   Not on file  Tobacco Use   Smoking status: Never   Smokeless tobacco: Never  Vaping Use   Vaping status: Never Used  Substance and Sexual Activity   Alcohol use: No   Drug use: No   Sexual activity: Not Currently  Other Topics Concern   Not on file  Social History Narrative   Not on file   Social Determinants of Health   Financial Resource Strain: Not on file  Food Insecurity: No Food Insecurity (12/31/2021)   [facility] from [facility], [facility]   Hunger Vital Sign    Worried About Running Out of Food in the Last Year: Never true    Ran Out of Food in the Last Year: Never true  Transportation Needs:  No Transportation Needs (12/31/2021)   Received from [facility], [facility]   [facility] - Transportation    In the past 12 months, has lack of transportation kept you from medical appointments or from getting medications?: No    Lack of Transportation (Non-Medical): No  Physical Activity: Not on file  Stress: Not on file  Social Connections: Not on file  Intimate Partner Violence: Not on file    FAMILY HISTORY: Family History  Problem Relation Age of Onset   Ovarian cancer Mother 42   Rheumatologic disease Maternal Aunt    Pancreatic cancer Maternal Grandfather    Colon cancer Paternal Grandmother    Breast cancer Neg Hx     ALLERGIES:  is allergic to other and gluten meal.  MEDICATIONS:  Current Outpatient Medications   Medication Sig Dispense Refill   acetaminophen (TYLENOL) 500 MG tablet Take 1,000 mg by mouth every 8 (eight) hours as needed (pain).     Azathioprine 75 MG TABS Take 1 tablet by mouth 2 (two) times daily.     cetirizine (ZYRTEC) 10 MG tablet Take 10 mg by mouth daily.     Cholecalciferol (VITAMIN D3) 1000 units CAPS Take 1,000 Units by mouth daily.     clobetasol cream (TEMOVATE) 0.05 % Apply topically.     escitalopram (LEXAPRO) 5 MG tablet Take 15 mg by mouth every evening.      famotidine (PEPCID) 20 MG tablet Take 20 mg by mouth 2 (two) times daily.     folic acid (FOLVITE) 1 MG tablet Take 1 mg by mouth daily.  11   metFORMIN (GLUCOPHAGE-XR) 500 MG 24 hr tablet Take 1,000 mg by mouth daily.     mupirocin ointment (BACTROBAN) 2 % Apply 1 Application topically 3 (three) times daily. 22 g 0   omeprazole (PRILOSEC) 20 MG capsule Take 20 mg by mouth daily.      pregabalin (LYRICA) 150 MG capsule Take 150 mg by mouth 2 (two) times daily.     Semaglutide (OZEMPIC, 0.25 OR 0.5 MG/DOSE, [place]) Inject 0.5 mg into the skin once a week.     SUMAtriptan (IMITREX) 50 MG tablet Take 50 mg by mouth every 2 (two) hours as needed for migraine.     topiramate (TOPAMAX) 25 MG tablet Take 50 mg by mouth daily.     triamcinolone (NASACORT) 55 MCG/ACT AERO nasal inhaler Place 2 sprays into the nose daily.     triamcinolone lotion (KENALOG) 0.1 % Apply 1 Application topically as directed. Daily up to 5 days a week to aa scalp prn flares, avoid face, groin, axilla 60 mL 4   vitamin B-12 (CYANOCOBALAMIN) 1000 MCG tablet Take 1,000 mcg by mouth daily.     etanercept (ENBREL) 50 MG/ML injection Inject 50 mg into the skin once a week.     Galcanezumab-gnlm 120 MG/ML SOAJ Inject 120 mg into the skin every 28 (twenty-eight) days. (Patient not taking: Reported on 12/18/2022)     LORazepam (ATIVAN) 0.5 MG tablet Take 0.5 mg by mouth 2 (two) times daily as needed for anxiety.  (Patient not taking: Reported on 05/02/2021)      rivaroxaban (XARELTO) 10 MG TABS tablet Take 1 tablet (10 mg total) by mouth daily. 90 tablet 1   No current facility-administered medications for this visit.      Marland Kitchen  PHYSICAL EXAMINATION: ECOG PERFORMANCE STATUS: 1 - Symptomatic but completely ambulatory Vitals:   12/18/22 1444 12/18/22 1455  BP: (!) 120/92 Marland Kitchen)  122/93  Pulse: 75   Resp: 18   Temp: (!) 96.1 F (35.6 C)    Filed Weights   12/18/22 1444  Weight: 199 lb 11.2 oz (90.6 kg)    Physical Exam Constitutional:      General: She is not in acute distress.    Appearance: She is not diaphoretic.  HENT:     Head: Normocephalic and atraumatic.  Eyes:     General: No scleral icterus. Cardiovascular:     Rate and Rhythm: Normal rate and regular rhythm.     Heart sounds: No murmur heard. Pulmonary:     Effort: Pulmonary effort is normal. No respiratory distress.     Breath sounds: No rales.  Chest:     Chest wall: No tenderness.  Abdominal:     General: There is no distension.     Palpations: Abdomen is soft.     Tenderness: There is no abdominal tenderness.  Musculoskeletal:        General: Normal range of motion.     Cervical back: Normal range of motion and neck supple.  Skin:    General: Skin is warm and dry.     Findings: No erythema.  Neurological:     Mental Status: She is alert and oriented to person, place, and time. Mental status is at baseline.     Cranial Nerves: No cranial nerve deficit.     Motor: No abnormal muscle tone.  Psychiatric:        Mood and Affect: Mood and affect normal.       RADIOGRAPHIC STUDIES: I have personally reviewed the radiological images as listed and agreed with the findings in the report. No results found.

## 2022-12-18 NOTE — Assessment & Plan Note (Addendum)
She has no constitutional symptoms. Workup showed JAK2 V617F mutation negative, with reflex to other mutations CALR, MPL, JAK 2 Ex 12-15 mutations negative.  Negative BCR-ABL FISH, negative EBV, CMV. Check peripheral blood flow cytometry. Borderline splenomegaly dated back to November 2023. Repeat ultrasound of spleen.

## 2022-12-18 NOTE — Assessment & Plan Note (Signed)
Multifactorial, possibly secondary to medication side effect, autoimmune disease and vitamin B12 deficiency.  Continue B12 and folate supplementation.

## 2022-12-18 NOTE — Assessment & Plan Note (Addendum)
Folate level is increased.  Continue folic acid supplementation.

## 2022-12-18 NOTE — Assessment & Plan Note (Addendum)
B12 level has improved and normalzied. Continue oral B12 supplementation.

## 2022-12-18 NOTE — Assessment & Plan Note (Signed)
#  NSIP (nonspecific interstitial pneumonitis)-patient follows up with rheumatologist

## 2022-12-18 NOTE — Assessment & Plan Note (Addendum)
#  History of pulmonary embolism due to OCP use, interstitial lung disease, heterozygous factor V Leiden mutation continue Xarelto 10 mg for prophylaxis daily for prophylaxis.  She tolerates well.

## 2023-01-24 ENCOUNTER — Other Ambulatory Visit: Payer: Self-pay

## 2023-01-24 ENCOUNTER — Emergency Department: Admission: RE | Admit: 2023-01-24 | Payer: BC Managed Care – PPO | Source: Ambulatory Visit

## 2023-01-24 ENCOUNTER — Emergency Department
Admission: EM | Admit: 2023-01-24 | Discharge: 2023-01-25 | Disposition: A | Discharge status: Home / Self Care | Payer: BC Managed Care – PPO | Attending: Emergency Medicine | Admitting: Emergency Medicine

## 2023-01-24 ENCOUNTER — Other Ambulatory Visit
Admission: RE | Admit: 2023-01-24 | Discharge: 2023-01-24 | Disposition: A | Discharge status: Home / Self Care | Payer: BC Managed Care – PPO | Attending: Physician Assistant | Admitting: Physician Assistant

## 2023-01-24 ENCOUNTER — Other Ambulatory Visit: Payer: Self-pay | Admitting: Physician Assistant

## 2023-01-24 ENCOUNTER — Emergency Department: Payer: BC Managed Care – PPO

## 2023-01-24 DIAGNOSIS — Z86711 Personal history of pulmonary embolism: Secondary | ICD-10-CM

## 2023-01-24 DIAGNOSIS — J189 Pneumonia, unspecified organism: Secondary | ICD-10-CM

## 2023-01-24 DIAGNOSIS — R0781 Pleurodynia: Secondary | ICD-10-CM

## 2023-01-24 DIAGNOSIS — R0789 Other chest pain: Secondary | ICD-10-CM | POA: Insufficient documentation

## 2023-01-24 DIAGNOSIS — R079 Chest pain, unspecified: Secondary | ICD-10-CM | POA: Insufficient documentation

## 2023-01-24 LAB — BASIC METABOLIC PANEL
Anion gap: 6 (ref 5–15)
BUN: 14 mg/dL (ref 6–20)
CO2: 22 mmol/L (ref 22–32)
Calcium: 8.7 mg/dL — ABNORMAL LOW (ref 8.9–10.3)
Chloride: 107 mmol/L (ref 98–111)
Creatinine, Ser: 0.8 mg/dL (ref 0.44–1.00)
GFR, Estimated: >60 mL/min (ref >60)
Glucose, Bld: 81 mg/dL (ref 70–99)
Potassium: 3.8 mmol/L (ref 3.5–5.1)
Sodium: 135 mmol/L (ref 135–145)

## 2023-01-24 LAB — CBC
HCT: 36.7 % (ref 36.0–46.0)
Hemoglobin: 12.7 g/dL (ref 12.0–15.0)
MCH: 32 pg (ref 26.0–34.0)
MCHC: 34.6 g/dL (ref 30.0–36.0)
MCV: 92.4 fL (ref 80.0–100.0)
Platelets: 228 10*3/uL (ref 150–400)
RBC: 3.97 MIL/uL (ref 3.87–5.11)
RDW: 13.7 % (ref 11.5–15.5)
WBC: 3.3 10*3/uL — ABNORMAL LOW (ref 4.0–10.5)
nRBC: 0 % (ref 0.0–0.2)

## 2023-01-24 LAB — D-DIMER, QUANTITATIVE
D-Dimer, Quant: 1.45 ug{FEU}/mL — ABNORMAL HIGH (ref 0.00–0.50)
D-Dimer, Quant: 1.5 ug{FEU}/mL — ABNORMAL HIGH (ref 0.00–0.50)

## 2023-01-24 LAB — TROPONIN I (HIGH SENSITIVITY)
Troponin I (High Sensitivity): <2 ng/L (ref <18)
Troponin I (High Sensitivity): <2 ng/L (ref <18)

## 2023-01-24 LAB — POC URINE PREG, ED: Preg Test, Ur: NEGATIVE

## 2023-01-24 MED ORDER — IOHEXOL 350 MG/ML SOLN
100.0000 mL | Freq: Once | INTRAVENOUS | Status: AC | PRN
  Administered 2023-01-24: 100 mL via INTRAVENOUS

## 2023-01-24 NOTE — ED Notes (Signed)
Patient provided with ginger ale at this time. Resting comfortably in bed, call bell in reach and family member at bedside.

## 2023-01-24 NOTE — ED Provider Notes (Signed)
-----------------------------------------   10:57 PM on 01/24/2023 -----------------------------------------  Assuming care from Dr. Derrill Kay.  In short, Pamela Mills is a 42 y.o. female with a chief complaint of pleuritic chest pain.  Refer to the original H&P for additional details.  The current plan of care is to follow up CTA chest and reassess.   Clinical Course as of 01/25/23 0432  Sat Jan 25, 2023  8413 CT Angio Chest PE W and/or Wo Contrast I viewed and interpreted the patient's CTA chest.  I could not see any evidence of PE but I do see what appears to be multifocal pneumonia.  Radiology report confirms no PE but multifocal pneumonia.  I have dated the patient with the results.  She acknowledges some relatively minor viral URI symptoms recently.  We will treat for community-acquired pneumonia and start with ceftriaxone 1 g IV and azithromycin 500 mg IV, then we will treat with a full course of outpatient antibiotics.  I gave my usual and customary follow-up recommendations and return precautions and she understands and agrees with the plan.  Patient is hemodynamically stable and initially I considered hospitalization but given her stability and overall lack of symptoms except for pleuritic chest pain, this should be very reasonable and appropriate. [CF]    Clinical Course User Index [CF] Loleta Rose, MD     Medications  iohexol (OMNIPAQUE) 350 MG/ML injection 100 mL (100 mLs Intravenous Contrast Given 01/24/23 2242)  cefTRIAXone (ROCEPHIN) 1 g in sodium chloride 0.9 % 100 mL IVPB (0 g Intravenous Stopped 01/25/23 0132)  azithromycin (ZITHROMAX) 500 mg in dextrose 5 % 250 mL IVPB (0 mg Intravenous Stopped 01/25/23 0208)     ED Discharge Orders          Ordered    amoxicillin-clavulanate (AUGMENTIN) 875-125 MG tablet  2 times daily        01/25/23 0121    azithromycin (ZITHROMAX) 250 MG tablet        01/25/23 0121           Final diagnoses:  Community acquired pneumonia,  unspecified laterality  Pleuritic chest pain     Loleta Rose, MD 01/25/23 [phone]

## 2023-01-24 NOTE — ED Triage Notes (Signed)
Pt went to PCP today after having right sided chest pain x 1 week. Pt states pain radiates into back and hurts worse with deep breathes. Pt had blood work drawn and was called by PCP to come to ER due to elevated d-dimer 1.45. PT states they wanted her to have a CT but then stated to come straight to ER first.

## 2023-01-24 NOTE — ED Provider Notes (Signed)
[facility] Provider Note    Event Date/Time   First MD Initiated Contact with Patient 01/24/23 1844     (approximate)   History   Chest Pain   HPI  Pamela Mills is a 42 y.o. female who presents to the emergency department today because of concerns for chest pain.  Pain is located on the right side of her chest.  It is sharp.  It is worse with deep breaths.  Patient does have history of PEs.  Patient went to her doctor today who got a D-dimer and it was elevated.  Patient is on Xarelto for her blood clots.  She denies any recent travel.     Physical Exam   Triage Vital Signs: ED Triage Vitals  Encounter Vitals Group     BP 01/24/23 1703 132/79     Systolic BP Percentile --      Diastolic BP Percentile --      Pulse Rate 01/24/23 1703 72     Resp 01/24/23 1703 16     Temp 01/24/23 1703 98.1 F (36.7 C)     Temp Source 01/24/23 1703 Oral     SpO2 01/24/23 1703 100 %     Weight 01/24/23 1707 195 lb (88.5 kg)     Height 01/24/23 1707 5\' 7"  (1.702 m)     Head Circumference --      Peak Flow --      Pain Score 01/24/23 1707 6     Pain Loc --      Pain Education --      Exclude from Growth Chart --     Most recent vital signs: Vitals:   01/24/23 1859 01/24/23 1900  BP: (!) 140/94 (!) 132/90  Pulse: 68 66  Resp: (!) 21 (!) 22  Temp:    SpO2: 100% 100%   General: Awake, alert, oriented. CV:  Good peripheral perfusion. Regular rate and rhythm. Resp:  Normal effort. Lungs clear. Abd:  No distention.    ED Results / Procedures / Treatments   Labs (all labs ordered are listed, but only abnormal results are displayed) Labs Reviewed  BASIC METABOLIC PANEL - Abnormal; Notable for the following components:      Result Value   Calcium 8.7 (*)    All other components within normal limits  CBC - Abnormal; Notable for the following components:   WBC 3.3 (*)    All other components within normal limits  D-DIMER, QUANTITATIVE - Abnormal;  Notable for the following components:   D-Dimer, Quant 1.50 (*)    All other components within normal limits  POC URINE PREG, ED  TROPONIN I (HIGH SENSITIVITY)  TROPONIN I (HIGH SENSITIVITY)     EKG  I, Phineas Semen, attending physician, personally viewed and interpreted this EKG  EKG Time: 1701 Rate: 75 Rhythm: normal sinus rhythm Axis: normal Intervals: qtc 428 QRS: narrow, low voltage ST changes: no st elevation Impression: abnormal ekg    RADIOLOGY CTA pending  PROCEDURES:  Critical Care performed: No    MEDICATIONS ORDERED IN ED: Medications - No data to display   IMPRESSION / MDM / ASSESSMENT AND PLAN / ED COURSE  I reviewed the triage vital signs and the nursing notes.                              Differential diagnosis includes, but is not limited to, PE, pneumonia, pneumothorax, MSK  Patient's presentation is most consistent with acute presentation with potential threat to life or bodily function.   The patient is on the cardiac monitor to evaluate for evidence of arrhythmia and/or significant heart rate changes.  Patient presented to the emergency department today because of concerns for elevated D-dimer in the setting of history of PEs.  Patient is still on a blood thinner.  D-dimer here was elevated as well.  Will obtain CT angio to evaluate for PE.      FINAL CLINICAL IMPRESSION(S) / ED DIAGNOSES   Chest pain   Note:  This document was prepared using Dragon voice recognition software and may include unintentional dictation errors.    Phineas Semen, MD 01/24/23 2236

## 2023-01-25 MED ORDER — AZITHROMYCIN 250 MG PO TABS: ORAL_TABLET | ORAL | 0 refills | Status: AC

## 2023-01-25 MED ORDER — AZITHROMYCIN 500 MG IV SOLR
500.0000 mg | Freq: Once | INTRAVENOUS | Status: AC
  Administered 2023-01-25: 500 mg via INTRAVENOUS
  Filled 2023-01-25: qty 5

## 2023-01-25 MED ORDER — SODIUM CHLORIDE 0.9 % IV SOLN
1.0000 g | INTRAVENOUS | Status: AC
  Administered 2023-01-25: 1 g via INTRAVENOUS
  Filled 2023-01-25: qty 10

## 2023-01-25 MED ORDER — AMOXICILLIN-POT CLAVULANATE 875-125 MG PO TABS: 1.0000 | ORAL_TABLET | Freq: Two times a day (BID) | ORAL | 0 refills | Status: AC

## 2023-01-25 NOTE — ED Notes (Signed)
Patient discharged from ED by provider. Patient provided with discharge papers and all questions answered. Patient ambulatory from ED in NAD

## 2023-01-25 NOTE — Discharge Instructions (Signed)
We believe that your symptoms are caused today by pneumonia, an infection in your lung(s).  Fortunately you should start to improve quickly after taking your antibiotics.  Please take the full course of antibiotics as prescribed and drink plenty of fluids.   ° °Follow up with your doctor within 1-2 days.  If you develop any new or worsening symptoms, including but not limited to fever in spite of taking over-the-counter ibuprofen and/or Tylenol, persistent vomiting, worsening shortness of breath, or other symptoms that concern you, please return to the Emergency Department immediately.  ° °

## 2023-01-28 ENCOUNTER — Ambulatory Visit
Admission: RE | Admit: 2023-01-28 | Discharge: 2023-01-28 | Disposition: A | Discharge status: Home / Self Care | Payer: BC Managed Care – PPO | Source: Ambulatory Visit | Attending: Oncology | Admitting: Oncology

## 2023-01-28 ENCOUNTER — Ambulatory Visit: Payer: BC Managed Care – PPO

## 2023-01-28 DIAGNOSIS — R161 Splenomegaly, not elsewhere classified: Secondary | ICD-10-CM

## 2023-01-29 ENCOUNTER — Encounter: Payer: Self-pay | Admitting: Oncology

## 2023-06-18 ENCOUNTER — Other Ambulatory Visit: Payer: BC Managed Care – PPO

## 2023-06-18 ENCOUNTER — Ambulatory Visit: Payer: BC Managed Care – PPO | Admitting: Oncology

## 2023-06-23 ENCOUNTER — Inpatient Hospital Stay

## 2023-06-23 ENCOUNTER — Encounter: Payer: Self-pay | Admitting: Oncology

## 2023-06-23 ENCOUNTER — Inpatient Hospital Stay: Payer: Self-pay | Attending: Oncology

## 2023-06-23 ENCOUNTER — Inpatient Hospital Stay (HOSPITAL_BASED_OUTPATIENT_CLINIC_OR_DEPARTMENT_OTHER): Payer: Self-pay | Admitting: Oncology

## 2023-06-23 VITALS — BP 121/83 | HR 72 | Temp 96.6°F | Resp 18 | Wt 203.9 lb

## 2023-06-23 DIAGNOSIS — E538 Deficiency of other specified B group vitamins: Secondary | ICD-10-CM | POA: Insufficient documentation

## 2023-06-23 DIAGNOSIS — J849 Interstitial pulmonary disease, unspecified: Secondary | ICD-10-CM

## 2023-06-23 DIAGNOSIS — I2699 Other pulmonary embolism without acute cor pulmonale: Secondary | ICD-10-CM | POA: Insufficient documentation

## 2023-06-23 DIAGNOSIS — D6851 Activated protein C resistance: Secondary | ICD-10-CM | POA: Diagnosis not present

## 2023-06-23 DIAGNOSIS — Z7901 Long term (current) use of anticoagulants: Secondary | ICD-10-CM | POA: Insufficient documentation

## 2023-06-23 LAB — CBC WITH DIFFERENTIAL (CANCER CENTER ONLY)
Abs Immature Granulocytes: 0.02 10*3/uL (ref 0.00–0.07)
Basophils Absolute: 0 10*3/uL (ref 0.0–0.1)
Basophils Relative: 1 %
Eosinophils Absolute: 0 10*3/uL (ref 0.0–0.5)
Eosinophils Relative: 1 %
HCT: 36.8 % (ref 36.0–46.0)
Hemoglobin: 12.6 g/dL (ref 12.0–15.0)
Immature Granulocytes: 1 %
Lymphocytes Relative: 6 %
Lymphs Abs: 0.3 10*3/uL — ABNORMAL LOW (ref 0.7–4.0)
MCH: 31.4 pg (ref 26.0–34.0)
MCHC: 34.2 g/dL (ref 30.0–36.0)
MCV: 91.8 fL (ref 80.0–100.0)
Monocytes Absolute: 0.3 10*3/uL (ref 0.1–1.0)
Monocytes Relative: 6 %
Neutro Abs: 3.9 10*3/uL (ref 1.7–7.7)
Neutrophils Relative %: 85 %
Platelet Count: 245 10*3/uL (ref 150–400)
RBC: 4.01 MIL/uL (ref 3.87–5.11)
RDW: 13.9 % (ref 11.5–15.5)
WBC Count: 4.4 10*3/uL (ref 4.0–10.5)
nRBC: 0 % (ref 0.0–0.2)

## 2023-06-23 LAB — CMP (CANCER CENTER ONLY)
ALT: 11 U/L (ref 0–44)
AST: 23 U/L (ref 15–41)
Albumin: 3.9 g/dL (ref 3.5–5.0)
Alkaline Phosphatase: 40 U/L (ref 38–126)
Anion gap: 7 (ref 5–15)
BUN: 15 mg/dL (ref 6–20)
CO2: 23 mmol/L (ref 22–32)
Calcium: 9.1 mg/dL (ref 8.9–10.3)
Chloride: 105 mmol/L (ref 98–111)
Creatinine: 0.7 mg/dL (ref 0.44–1.00)
GFR, Estimated: >60 mL/min (ref >60)
Glucose, Bld: 123 mg/dL — ABNORMAL HIGH (ref 70–99)
Potassium: 4 mmol/L (ref 3.5–5.1)
Sodium: 135 mmol/L (ref 135–145)
Total Bilirubin: 0.6 mg/dL (ref 0.0–1.2)
Total Protein: 7.2 g/dL (ref 6.5–8.1)

## 2023-06-23 LAB — FOLATE: Folate: 5.6 ng/mL — ABNORMAL LOW (ref >5.9)

## 2023-06-23 LAB — VITAMIN B12: Vitamin B-12: 288 pg/mL (ref 180–914)

## 2023-06-23 LAB — HEPATITIS B SURFACE ANTIGEN: Hepatitis B Surface Ag: NONREACTIVE

## 2023-06-23 LAB — HEPATITIS B CORE ANTIBODY, IGM: Hep B C IgM: NONREACTIVE

## 2023-06-23 MED ORDER — RIVAROXABAN 10 MG PO TABS: 10.0000 mg | ORAL_TABLET | Freq: Every day | ORAL | 1 refills | Status: DC

## 2023-06-23 NOTE — Progress Notes (Signed)
 Hematology/Oncology Progress note [phone] [phone]  Reason for visit Follow-up for PE and chronic anticoagulation.  Splenomegaly  ASSESSMENT & PLAN:   Pulmonary embolus (HCC) #History of pulmonary embolism due to OCP use, interstitial lung disease, heterozygous factor V Leiden mutation continue Xarelto 10 mg for prophylaxis daily for prophylaxis.  She tolerates well.   Folate deficiency Folate level is low Continue folic acid supplementation.  Heterozygous factor V Leiden mutation (HCC) On anticoagulation prophylaxis.   Interstitial lung disorders (HCC) # NSIP (nonspecific interstitial pneumonitis)-patient follows up with rheumatologist Recent rituximab treatment, check hepatitis B panel.    Vitamin B12 deficiency B12 level is pending Continue oral B12 supplementation.    Orders Placed This Encounter  Procedures   Hepatitis B surface antigen    Standing Status:   Future    Number of Occurrences:   1    Expected Date:   06/23/2023    Expiration Date:   06/22/2024   Hepatitis B core antibody, IgM    Standing Status:   Future    Number of Occurrences:   1    Expected Date:   06/23/2023    Expiration Date:   06/22/2024   CMP (Cancer Center only)    Standing Status:   Future    Expected Date:   12/23/2023    Expiration Date:   06/22/2024   CBC with Differential (Cancer Center Only)    Standing Status:   Future    Expected Date:   12/23/2023    Expiration Date:   06/22/2024   Folate    Standing Status:   Future    Expected Date:   12/23/2023    Expiration Date:   06/22/2024   Vitamin B12    Standing Status:   Future    Expected Date:   12/23/2023    Expiration Date:   06/22/2024   Follow up in 6 months.  All questions were answered. The patient knows to call the clinic with any problems, questions or concerns.  Rickard Patience, MD, PhD [facility] Hematology Oncology 06/23/2023    HISTORY OF PRESENTING ILLNESS:  Pamela Mills 43 y.o.  female with  past medical history listed as below who  follow-up for unprovoked pulmonary embolism and  anticoagulation. Since  November 2017, patient started to experience persistent dry cough, exertional shortness of breath, progressive swelling in hands and feet, she was referred to see rheumatology. Workup showed seropositive rheumatoid arthritis, Negative anti-CCP but elevated RF Patient was  initially prednisone and later started on methotrexate weekly (started on 08/15/2016) for treatment.. Patient's respiratory symptoms improved and then worsen again. At that point she was referred by her primary care to see Dr. Meredeth Ide pulmonary for evaluation. CT without contrast on 07/30/2016 showed basilar and apparent from predominant ground glass opacities in the architectural distortion, with possible minimum lower lobe bronchiectasis suggesting interstitial lung disease. There is also mild the bilateral axillary, subpectoral, supraclavicular, mediastinal adenopathy.lymph nodes are increased in number, mildly enlarged. Largest left axillary measuring 11 mm short axis. Findings favor reactive/systemic process with autoimmune disorders.  Patient's rheumatology symptoms improved with treatment. Patient had wax and wane of her respiratory symptoms. In the summer, she has had 2 round trips of 8 hour car ride back and forth to [place] where her father lives. Most recent one was on August 5. She has felt lower extremity swelling for which she has to put on stocking to reduce swelling. Also she felt a little stretchy feeling on her cough.  She mentioned to Dr. Meredeth Ide who ordered a follow-up CT scan. CT without contrast chest on 11/26/2016 showed patchy groundglass in the septal thickening in both lower lobes, less severe than 07/27/2016. There is new rounded consolidation in the left lower lobe, possible infectious etiology. No definitive evidence of interstitial lung disease. Meanwhile her respiratory symptoms has become worse. She has  experienced coughing, exertional shortness of breath, and mild hemoptysis. This triggered a d-dimer testing which came back high. Patient had CT anginal chest PE protocol on 11/29/2016 which showed acute appearing nonconclusive pulmonary emboli within several segmental pulmonary artery branches to the left lower lobe. Small pleural-based consolidation with the posterior aspect of the left lower lobe presumably associated focal pulmonary infarction. Associated small left pleural effusion. No evidence of associated right heart straining. Questionable focal low-density pulmonary embolus within the segmental pulmonary artery branch to the right middle lobe.  lower extremity ultrasound revealed no DVT within the right lower extremity. Patient was started on Eliquis 5 mg twice a day.  Patient has had some rheumatology/hypercoagulable workup done with [facility]. Reviewing her labs showed on 08/08/2016, she is tested negative for anti-Cardiolite pain antibody IgG < 9, IgM slightly elevated at 15. Beta-2 glycoprotein IgG and IgM normal, normal axial coronal phase phospholipid, DVvT normal, negative lupus anticoagulant.. Serum protein electrophoresis negative for M spike.  11/29/2016, homocystine 8, lupus anticoagulant was retested was negative, mildly low protein S activity at 48%, normal protein S antigen. Slightly low protein S antigen. Normal anti-thrombin function. She is tested positive for single R506Q mutation heterozygous factor V Leiden mutation.  Patient has a family history of ovarian cancer in her mom. She reports that she was tested negative for genetic test.  She takes OCP since she was teenage.  This is her fist episode of blood clot. She was taking of OCP and referred to see gynecology.  # 06/13/2017 Antiphospholipid panel was checked at last visit again.  Which showed negative panel except slightly elevated anti cardiolipin IgM at 15.  Repeat protein S activity was 75%, protein S antigen at  52%, mildly decreased.  slight positive anti cardiolipin IgM does not meet criteria for Antiphospholipid syndrome 02/02/18 repeat protein antigen was normal at 62%.  08/04/2018, decrease to Eliquis 2.5 mg twice daily. 04/14/2020, switched to Xarelto 10 mg daily due to lack of insurance coverage for Eliquis July/August 2022 Switched to Lovenox 40mg  daily with pregnancy planning     NSIP (nonspecific interstitial pneumonitis)-patient follows up with rheumatologist and was  treated with CellCept and sulfasalazine.  INTERVAL HISTORY SHIELA BRUNS is a 43 y.o. female who has above history reviewed by me today presents for follow-up management of pulmonary embolism.  Tolerates Xarelto well.  No bleeding events.  She has follow-up appointment with rheumatologist for NSIP management. Off Azathioprine and Enbrel. She has tried adalimumab and not able to tolerate.  She is recommended to start Enbrel. She has experienced worsening of hand and feet joint pain, swelling/tightness was advised to start on methotrexate. Currently on prednisone which improves joint swelling.  throat hoarseness for several months. She has acid reflux, has an EGD scheduled in April 2025.    Review of Systems  Constitutional:  Negative for appetite change, chills, fatigue and fever.  HENT:   Negative for hearing loss and voice change.   Eyes:  Negative for eye problems.  Respiratory:  Negative for chest tightness and cough.   Cardiovascular:  Negative for chest pain.  Gastrointestinal:  Negative for abdominal  distention, abdominal pain and blood in stool.  Endocrine: Negative for hot flashes.  Genitourinary:  Negative for difficulty urinating and frequency.   Musculoskeletal:  Positive for arthralgias.  Skin:  Negative for itching and rash.       Purplish discoloration of toes.   Neurological:  Negative for extremity weakness, headaches and light-headedness.  Hematological:  Negative for adenopathy.  Psychiatric/Behavioral:   Negative for confusion.     MEDICAL HISTORY:  Past Medical History:  Diagnosis Date   Anxiety    Arthritis    Basal cell carcinoma    Post neck - treated years ago    Cancer (HCC)    skin   Collagen vascular disease (HCC)    Depression    Diverticulitis    Dysplastic nevus 05/27/2019   R distal ant thigh - moderate   Factor 5 Leiden mutation, heterozygous (HCC) 2018   GERD (gastroesophageal reflux disease)    Migraines    Preterm labor    Pulmonary embolism (HCC)    Vaginal Pap smear, abnormal     SURGICAL HISTORY: Past Surgical History:  Procedure Laterality Date   CHOLECYSTECTOMY N/A 06/29/2015   Procedure: LAPAROSCOPIC CHOLECYSTECTOMY;  Surgeon: Almond Lint, MD;  Location: [facility] [place];  Service: General;  Laterality: N/A;   CHROMOPERTUBATION Right 10/17/2017   Procedure: CHROMOPERTUBATION;  Surgeon: Ward, Elenora Fender, MD;  Location: [facility];  Service: Gynecology;  Laterality: Right;   DILATION AND CURETTAGE OF UTERUS  2016   DILATION AND EVACUATION N/A 11/10/2014   Procedure: DILATATION AND EVACUATION;  Surgeon: Elenora Fender Ward, MD;  Location: [facility];  Service: Gynecology;  Laterality: N/A;   FLEXIBLE BRONCHOSCOPY N/A 02/18/2018   Procedure: FLEXIBLE BRONCHOSCOPY;  Surgeon: Vida Rigger, MD;  Location: [facility];  Service: Thoracic;  Laterality: N/A;   FOOT SURGERY Left 01/2012   FOOT SURGERY Left 2013   Plantar Fasiciatis   LAPAROSCOPIC ENDOMETRIOSIS FULGURATION  2006   LAPAROSCOPIC UNILATERAL SALPINGO OOPHERECTOMY Left 10/17/2017   Procedure: LAPAROSCOPIC UNILATERAL SALPINGO OOPHORECTOMY;  Surgeon: Ward, Elenora Fender, MD;  Location: [facility];  Service: Gynecology;  Laterality: Left;   LAPAROSCOPY N/A 10/17/2017   Procedure: LAPAROSCOPY OPERATIVE, Olin Pia;  Surgeon: Ward, Elenora Fender, MD;  Location: [facility];  Service: Gynecology;  Laterality: N/A;   oophotectomy Left 09/2017   2 cysts on the same side also removed   UPPER GI ENDOSCOPY       SOCIAL HISTORY: Social History   Socioeconomic History   Marital status: Married    Spouse name: Ricky   Number of children: Not on file   Years of education: Not on file   Highest education level: Not on file  Occupational History   Not on file  Tobacco Use   Smoking status: Never   Smokeless tobacco: Never  Vaping Use   Vaping status: Never Used  Substance and Sexual Activity   Alcohol use: No   Drug use: No   Sexual activity: Not Currently  Other Topics Concern   Not on file  Social History Narrative   Not on file   Social Drivers of Health   Financial Resource Strain: Not on file  Food Insecurity: No Food Insecurity (02/03/2023)   Received from [facility]   Hunger Vital Sign    Worried About Running Out of Food in the Last Year: Never true    Ran Out of Food in the Last Year: Never true  Transportation Needs: No Transportation Needs (02/03/2023)  Received from [facility] - Transportation    In the past 12 months, has lack of transportation kept you from medical appointments or from getting medications?: No    Lack of Transportation (Non-Medical): No  Physical Activity: Not on file  Stress: Not on file  Social Connections: Not on file  Intimate Partner Violence: Not on file    FAMILY HISTORY: Family History  Problem Relation Age of Onset   Ovarian cancer Mother 38   Rheumatologic disease Maternal Aunt    Pancreatic cancer Maternal Grandfather    Colon cancer Paternal Grandmother    Breast cancer Neg Hx     ALLERGIES:  is allergic to other and gluten meal.  MEDICATIONS:  Current Outpatient Medications  Medication Sig Dispense Refill   acetaminophen (TYLENOL) 500 MG tablet Take 1,000 mg by mouth every 8 (eight) hours as needed (pain).     cetirizine (ZYRTEC) 10 MG tablet Take 10 mg by mouth daily.     Cholecalciferol (VITAMIN D3) 1000 units CAPS Take 1,000 Units by mouth daily.     clobetasol cream  (TEMOVATE) 0.05 % Apply topically.     escitalopram (LEXAPRO) 5 MG tablet Take 15 mg by mouth every evening.      famotidine (PEPCID) 20 MG tablet Take 20 mg by mouth 2 (two) times daily.     folic acid (FOLVITE) 1 MG tablet Take 1 mg by mouth daily.  11   metFORMIN (GLUCOPHAGE-XR) 500 MG 24 hr tablet Take 1,000 mg by mouth daily.     methotrexate 50 MG/2ML injection Inject into the skin.     mupirocin ointment (BACTROBAN) 2 % Apply 1 Application topically 3 (three) times daily. 22 g 0   omeprazole (PRILOSEC) 20 MG capsule Take 20 mg by mouth daily.      pregabalin (LYRICA) 150 MG capsule Take 150 mg by mouth 2 (two) times daily.     sulfamethoxazole-trimethoprim (BACTRIM DS) 800-160 MG tablet Take by mouth.     SUMAtriptan (IMITREX) 50 MG tablet Take 50 mg by mouth every 2 (two) hours as needed for migraine.     topiramate (TOPAMAX) 25 MG tablet Take 50 mg by mouth daily.     triamcinolone (NASACORT) 55 MCG/ACT AERO nasal inhaler Place 2 sprays into the nose daily.     triamcinolone lotion (KENALOG) 0.1 % Apply 1 Application topically as directed. Daily up to 5 days a week to aa scalp prn flares, avoid face, groin, axilla 60 mL 4   vitamin B-12 (CYANOCOBALAMIN) 1000 MCG tablet Take 1,000 mcg by mouth daily.     azithromycin (ZITHROMAX) 250 MG tablet Take 1 tablet PO daily for 4 more days starting the day after your visit to the Emergency Department (Patient not taking: Reported on 06/23/2023) 4 each 0   Galcanezumab-gnlm 120 MG/ML SOAJ Inject 120 mg into the skin every 28 (twenty-eight) days. (Patient not taking: Reported on 06/23/2023)     LORazepam (ATIVAN) 0.5 MG tablet Take 0.5 mg by mouth 2 (two) times daily as needed for anxiety.  (Patient not taking: Reported on 05/02/2021)     predniSONE (DELTASONE) 10 MG tablet Take by mouth.     rivaroxaban (XARELTO) 10 MG TABS tablet Take 1 tablet (10 mg total) by mouth daily. 90 tablet 1   Semaglutide (OZEMPIC, 0.25 OR 0.5 MG/DOSE, Farley) Inject 0.5 mg into  the skin once a week. (Patient not taking: Reported on 06/23/2023)     No current facility-administered medications  for this visit.      Marland Kitchen  PHYSICAL EXAMINATION: ECOG PERFORMANCE STATUS: 1 - Symptomatic but completely ambulatory Vitals:   06/23/23 1445  BP: 121/83  Pulse: 72  Resp: 18  Temp: (!) 96.6 F (35.9 C)  SpO2: 95%   Filed Weights   06/23/23 1445  Weight: 203 lb 14.4 oz (92.5 kg)    Physical Exam Constitutional:      General: She is not in acute distress.    Appearance: She is not diaphoretic.  HENT:     Head: Normocephalic and atraumatic.  Eyes:     General: No scleral icterus. Cardiovascular:     Rate and Rhythm: Normal rate and regular rhythm.     Heart sounds: No murmur heard. Pulmonary:     Effort: Pulmonary effort is normal. No respiratory distress.     Breath sounds: No wheezing.  Abdominal:     General: There is no distension.     Palpations: Abdomen is soft.     Tenderness: There is no abdominal tenderness.  Musculoskeletal:        General: Swelling present. Normal range of motion.     Cervical back: Normal range of motion and neck supple.  Skin:    General: Skin is warm and dry.     Findings: No erythema.  Neurological:     Mental Status: She is alert and oriented to person, place, and time. Mental status is at baseline.     Cranial Nerves: No cranial nerve deficit.     Motor: No abnormal muscle tone.  Psychiatric:        Mood and Affect: Mood and affect normal.       RADIOGRAPHIC STUDIES: I have personally reviewed the radiological images as listed and agreed with the findings in the report. No results found.

## 2023-06-23 NOTE — Assessment & Plan Note (Addendum)
#   NSIP (nonspecific interstitial pneumonitis)-patient follows up with rheumatologist Recent rituximab treatment, check hepatitis B panel.

## 2023-06-23 NOTE — Assessment & Plan Note (Signed)
#  History of pulmonary embolism due to OCP use, interstitial lung disease, heterozygous factor V Leiden mutation continue Xarelto 10 mg for prophylaxis daily for prophylaxis.  She tolerates well.

## 2023-06-23 NOTE — Assessment & Plan Note (Addendum)
 Folate level is low Continue folic acid supplementation.

## 2023-06-23 NOTE — Assessment & Plan Note (Signed)
 B12 level is pending Continue oral B12 supplementation.

## 2023-06-23 NOTE — Assessment & Plan Note (Signed)
On anticoagulation prophylaxis.

## 2023-06-24 ENCOUNTER — Encounter: Payer: Self-pay | Admitting: Oncology

## 2023-07-07 ENCOUNTER — Encounter: Payer: Self-pay | Admitting: Oncology

## 2023-10-02 ENCOUNTER — Ambulatory Visit: Payer: BC Managed Care – PPO | Admitting: Dermatology

## 2023-10-02 ENCOUNTER — Encounter: Payer: Self-pay | Admitting: Dermatology

## 2023-10-02 DIAGNOSIS — L570 Actinic keratosis: Secondary | ICD-10-CM | POA: Diagnosis not present

## 2023-10-02 DIAGNOSIS — D229 Melanocytic nevi, unspecified: Secondary | ICD-10-CM

## 2023-10-02 DIAGNOSIS — Z1283 Encounter for screening for malignant neoplasm of skin: Secondary | ICD-10-CM

## 2023-10-02 DIAGNOSIS — D1801 Hemangioma of skin and subcutaneous tissue: Secondary | ICD-10-CM

## 2023-10-02 DIAGNOSIS — W908XXA Exposure to other nonionizing radiation, initial encounter: Secondary | ICD-10-CM | POA: Diagnosis not present

## 2023-10-02 DIAGNOSIS — I781 Nevus, non-neoplastic: Secondary | ICD-10-CM

## 2023-10-02 DIAGNOSIS — Z7189 Other specified counseling: Secondary | ICD-10-CM

## 2023-10-02 DIAGNOSIS — M329 Systemic lupus erythematosus, unspecified: Secondary | ICD-10-CM

## 2023-10-02 DIAGNOSIS — L821 Other seborrheic keratosis: Secondary | ICD-10-CM

## 2023-10-02 DIAGNOSIS — Z79899 Other long term (current) drug therapy: Secondary | ICD-10-CM

## 2023-10-02 DIAGNOSIS — L814 Other melanin hyperpigmentation: Secondary | ICD-10-CM

## 2023-10-02 DIAGNOSIS — L578 Other skin changes due to chronic exposure to nonionizing radiation: Secondary | ICD-10-CM

## 2023-10-02 DIAGNOSIS — Z86018 Personal history of other benign neoplasm: Secondary | ICD-10-CM

## 2023-10-02 DIAGNOSIS — L930 Discoid lupus erythematosus: Secondary | ICD-10-CM

## 2023-10-02 DIAGNOSIS — L905 Scar conditions and fibrosis of skin: Secondary | ICD-10-CM

## 2023-10-02 DIAGNOSIS — M069 Rheumatoid arthritis, unspecified: Secondary | ICD-10-CM

## 2023-10-02 DIAGNOSIS — Z85828 Personal history of other malignant neoplasm of skin: Secondary | ICD-10-CM

## 2023-10-02 DIAGNOSIS — L409 Psoriasis, unspecified: Secondary | ICD-10-CM

## 2023-10-02 MED ORDER — TRIAMCINOLONE ACETONIDE 0.1 % EX LOTN: 1.0000 | TOPICAL_LOTION | CUTANEOUS | 4 refills | Status: AC

## 2023-10-02 NOTE — Progress Notes (Signed)
 Follow-Up Visit   Subjective  Pamela Mills is a 43 y.o. female who presents for the following: Skin Cancer Screening and Full Body Skin Exam. Hx of BCC, Dysplastic Nevus. Hx Lupus. Dry spot at forehead, reoccurring, place at L side of face. Chiblins L 2nd toe patient has not been using clobetasol at L second toe, still discolored.   Patient using TMC lotion to scalp for psoriasis has not had to use in a few months.   Patient no longer on Humira, now does infusions with rheumatology (riTUXimab-abbs).  The patient presents for Total-Body Skin Exam (TBSE) for skin cancer screening and mole check. The patient has spots, moles and lesions to be evaluated, some may be new or changing and the patient may have concern these could be cancer.   The following portions of the chart were reviewed this encounter and updated as appropriate: medications, allergies, medical history  Review of Systems:  No other skin or systemic complaints except as noted in HPI or Assessment and Plan.  Objective  Well appearing patient in no apparent distress; mood and affect are within normal limits.  A full examination was performed including scalp, head, eyes, ears, nose, lips, neck, chest, axillae, abdomen, back, buttocks, bilateral upper extremities, bilateral lower extremities, hands, feet, fingers, toes, fingernails, and toenails. All findings within normal limits unless otherwise noted below.   Relevant physical exam findings are noted in the Assessment and Plan.  Left Temple x1, proximal zygoma x1 (2) Pink scaly macules  Assessment & Plan   SKIN CANCER SCREENING PERFORMED TODAY.  ACTINIC DAMAGE - Chronic condition, secondary to cumulative UV/sun exposure - diffuse scaly erythematous macules with underlying dyspigmentation - Recommend daily broad spectrum sunscreen SPF 30+ to sun-exposed areas, reapply every 2 hours as needed.  - Staying in the shade or wearing long sleeves, sun glasses (UVA+UVB  protection) and wide brim hats (4-inch brim around the entire circumference of the hat) are also recommended for sun protection.  - Call for new or changing lesions.  LENTIGINES, SEBORRHEIC KERATOSES, HEMANGIOMAS - Benign normal skin lesions - Benign-appearing - Call for any changes  MELANOCYTIC NEVI - Tan-brown and/or pink-flesh-colored symmetric macules and papules - Benign appearing on exam today - Observation - Call clinic for new or changing moles - Recommend daily use of broad spectrum spf 30+ sunscreen to sun-exposed areas.   HISTORY OF BASAL CELL CARCINOMA OF THE SKIN Posterior neck, clear today. R upper cutaneous lip- MOHs w/ Dr. Bluford 07/05/2020 , clear today.  - No evidence of recurrence today - Recommend regular full body skin exams - Recommend daily broad spectrum sunscreen SPF 30+ to sun-exposed areas, reapply every 2 hours as needed.  - Call if any new or changing lesions are noted between office visits    HISTORY OF DYSPLASTIC NEVUS R distal ant thigh moderate- 05/27/2019 No evidence of recurrence today Recommend regular full body skin exams Recommend daily broad spectrum sunscreen SPF 30+ to sun-exposed areas, reapply every 2 hours as needed.  Call if any new or changing lesions are noted between office visits   History of CHILBLAINS on toes Clinically diagnosed by her Rheumatologist in past Pt has LUPUS Chronic condition with duration or expected duration over one year. Currently well-controlled. Exam: PIP joint swelling at L 2nd toe (pt has RA) No active Chilblains today. Treatment Plan: Hx chilblains, dx with rheumatology clinically, has clobetasol currently, but not active. Do not recommend biopsy today since not currently active.  Cont f/u with Rheumatology  TELANGIECTASIA Exam: dilated blood vessel(s) at nose  Treatment Plan: Benign appearing on exam Call for changes Counseling for BBL / IPL / Laser and Coordination of Care Discussed the treatment  option of Broad Band Light (BBL) /Intense Pulsed Light (IPL)/ Laser for skin discoloration, including brown spots and redness.  Typically we recommend at least 1-3 treatment sessions about 5-8 weeks apart for best results.  Cannot have tanned skin when BBL performed, and regular use of sunscreen/photoprotection is advised after the procedure to help maintain results. The patient's condition may also require maintenance treatments in the future.  The fee for BBL / laser treatments is $350 per treatment session for the whole face.  A fee can be quoted for other parts of the body.  Insurance typically does not pay for BBL/laser treatments and therefore the fee is an out-of-pocket cost. Recommend prophylactic valtrex treatment. Once scheduled for procedure, will send Rx in prior to patient's appointment.  Discussed may require more than one treatment.   VASCULAR BIRTHMARK Exam: post scalp with violaceous patch Treatment Plan: Benign-appearing.  Observation.  Call clinic for new or changing lesions.  Recommend daily use of broad spectrum spf 30+ sunscreen to sun-exposed areas.      RHEUMATOID ARTHRITIS Treatment Plan:  Cont riTUXimab-abbs infusions with rheumatology Cont f/u with rheumatology   PSORIASIS Scalp Exam: Scalp clear on exam today.  0% BSA Psoriasis is a chronic non-curable, but treatable genetic/hereditary disease that may have other systemic features affecting other organ systems such as joints (Psoriatic Arthritis). It is associated with an increased risk of inflammatory bowel disease, heart disease, non-alcoholic fatty liver disease, and depression.  Treatments include light and laser treatments; topical medications; and systemic medications including oral and injectables. Treatment Plan: Continue TMC 0.1% lotion 5d/wk aa scalp prn flares, avoid f/g/a    Topical steroids (such as triamcinolone , fluocinolone, fluocinonide, mometasone, clobetasol, halobetasol, betamethasone,  hydrocortisone) can cause thinning and lightening of the skin if they are used for too long in the same area. Your physician has selected the right strength medicine for your problem and area affected on the body. Please use your medication only as directed by your physician to prevent side effects.    SCAR- L temple secondary to childhood trauma with puck  Exam: Dyspigmented smooth macule or patch. Benign-appearing.  Observation.  Call clinic for new or changing lesions. Recommend daily broad spectrum sunscreen SPF 30+, reapply every 2 hours as needed. Treatment: Recommend Serica moisturizing scar formula cream every night or [employer] brand or Mederma silicone scar sheet every night for the first year after a scar appears to help with scar remodeling if desired. Scars remodel on their own for a full year and will gradually improve in appearance over time.   AK (ACTINIC KERATOSIS) (2) Left Temple x1, proximal zygoma x1 (2) Actinic keratoses are precancerous spots that appear secondary to cumulative UV radiation exposure/sun exposure over time. They are chronic with expected duration over 1 year. A portion of actinic keratoses will progress to squamous cell carcinoma of the skin. It is not possible to reliably predict which spots will progress to skin cancer and so treatment is recommended to prevent development of skin cancer.  Recommend daily broad spectrum sunscreen SPF 30+ to sun-exposed areas, reapply every 2 hours as needed.  Recommend staying in the shade or wearing long sleeves, sun glasses (UVA+UVB protection) and wide brim hats (4-inch brim around the entire circumference of the hat). Call for new or changing lesions. Destruction of lesion -  Left Temple x1, proximal zygoma x1 (2) Complexity: simple   Destruction method: cryotherapy   Informed consent: discussed and consent obtained   Timeout:  patient name, date of birth, surgical site, and procedure verified Lesion destroyed using  liquid nitrogen: Yes   Region frozen until ice ball extended beyond lesion: Yes   Outcome: patient tolerated procedure well with no complications   Post-procedure details: wound care instructions given   Additional details:  Prior to procedure, discussed risks of blister formation, small wound, skin dyspigmentation, or rare scar following cryotherapy. Recommend Vaseline ointment to treated areas while healing.    Return in 1 year (on 10/01/2024) for w/ Dr. Hester, TBSE.  I, Jacquelynn V. Wilfred, CMA, am acting as scribe for Alm Hester, MD .   Documentation: I have reviewed the above documentation for accuracy and completeness, and I agree with the above.  Alm Hester, MD

## 2023-10-02 NOTE — Patient Instructions (Addendum)

## 2023-12-25 ENCOUNTER — Other Ambulatory Visit: Payer: Self-pay | Admitting: Oncology

## 2023-12-30 ENCOUNTER — Inpatient Hospital Stay: Attending: Oncology

## 2023-12-30 ENCOUNTER — Inpatient Hospital Stay: Admitting: Oncology

## 2023-12-30 ENCOUNTER — Encounter: Payer: Self-pay | Admitting: Oncology

## 2023-12-30 VITALS — BP 107/76 | HR 69 | Temp 98.6°F | Resp 20 | Wt 205.8 lb

## 2023-12-30 DIAGNOSIS — D72810 Lymphocytopenia: Secondary | ICD-10-CM

## 2023-12-30 DIAGNOSIS — E538 Deficiency of other specified B group vitamins: Secondary | ICD-10-CM | POA: Diagnosis not present

## 2023-12-30 DIAGNOSIS — R161 Splenomegaly, not elsewhere classified: Secondary | ICD-10-CM | POA: Diagnosis not present

## 2023-12-30 DIAGNOSIS — Z86711 Personal history of pulmonary embolism: Secondary | ICD-10-CM | POA: Insufficient documentation

## 2023-12-30 DIAGNOSIS — D6851 Activated protein C resistance: Secondary | ICD-10-CM | POA: Insufficient documentation

## 2023-12-30 DIAGNOSIS — Z800 Family history of malignant neoplasm of digestive organs: Secondary | ICD-10-CM | POA: Insufficient documentation

## 2023-12-30 DIAGNOSIS — Z8041 Family history of malignant neoplasm of ovary: Secondary | ICD-10-CM | POA: Insufficient documentation

## 2023-12-30 DIAGNOSIS — I2699 Other pulmonary embolism without acute cor pulmonale: Secondary | ICD-10-CM

## 2023-12-30 LAB — CMP (CANCER CENTER ONLY)
ALT: 16 U/L (ref 0–44)
AST: 22 U/L (ref 15–41)
Albumin: 4 g/dL (ref 3.5–5.0)
Alkaline Phosphatase: 50 U/L (ref 38–126)
Anion gap: 5 (ref 5–15)
BUN: 12 mg/dL (ref 6–20)
CO2: 23 mmol/L (ref 22–32)
Calcium: 8.4 mg/dL — ABNORMAL LOW (ref 8.9–10.3)
Chloride: 106 mmol/L (ref 98–111)
Creatinine: 0.87 mg/dL (ref 0.44–1.00)
GFR, Estimated: >60 mL/min (ref >60)
Glucose, Bld: 114 mg/dL — ABNORMAL HIGH (ref 70–99)
Potassium: 3.9 mmol/L (ref 3.5–5.1)
Sodium: 134 mmol/L — ABNORMAL LOW (ref 135–145)
Total Bilirubin: 1 mg/dL (ref 0.0–1.2)
Total Protein: 6.9 g/dL (ref 6.5–8.1)

## 2023-12-30 LAB — FOLATE: Folate: >20 ng/mL (ref >5.9)

## 2023-12-30 LAB — VITAMIN B12: Vitamin B-12: 321 pg/mL (ref 180–914)

## 2023-12-30 LAB — CBC WITH DIFFERENTIAL (CANCER CENTER ONLY)
Abs Immature Granulocytes: 0.01 K/uL (ref 0.00–0.07)
Basophils Absolute: 0 K/uL (ref 0.0–0.1)
Basophils Relative: 0 %
Eosinophils Absolute: 0.1 K/uL (ref 0.0–0.5)
Eosinophils Relative: 4 %
HCT: 36.7 % (ref 36.0–46.0)
Hemoglobin: 12.7 g/dL (ref 12.0–15.0)
Immature Granulocytes: 0 %
Lymphocytes Relative: 11 %
Lymphs Abs: 0.3 K/uL — ABNORMAL LOW (ref 0.7–4.0)
MCH: 31.2 pg (ref 26.0–34.0)
MCHC: 34.6 g/dL (ref 30.0–36.0)
MCV: 90.2 fL (ref 80.0–100.0)
Monocytes Absolute: 0.3 K/uL (ref 0.1–1.0)
Monocytes Relative: 9 %
Neutro Abs: 2.2 K/uL (ref 1.7–7.7)
Neutrophils Relative %: 76 %
Platelet Count: 215 K/uL (ref 150–400)
RBC: 4.07 MIL/uL (ref 3.87–5.11)
RDW: 14.5 % (ref 11.5–15.5)
WBC Count: 2.9 K/uL — ABNORMAL LOW (ref 4.0–10.5)
nRBC: 0 % (ref 0.0–0.2)

## 2023-12-30 NOTE — Assessment & Plan Note (Signed)
 Folate level is pending Continue folic acid  supplementation.

## 2023-12-30 NOTE — Assessment & Plan Note (Signed)
 She has no constitutional symptoms. Workup showed JAK2 [id number] mutation negative, with reflex to other mutations CALR, MPL, JAK 2 Ex 12-15 mutations negative.  Negative BCR-ABL FISH, negative EBV, CMV. Check peripheral blood flow cytometry. Borderline splenomegaly dated back to November 2023. Repeat ultrasound of spleen in Nov 2024 showed mild splenomegaly 13.5cm Monitor. SABRA

## 2023-12-30 NOTE — Progress Notes (Signed)
 Hematology/Oncology Progress note [phone] [phone]  Reason for visit Follow-up for PE and chronic anticoagulation.  Splenomegaly  ASSESSMENT & PLAN:   Pulmonary embolus (HCC) #History of pulmonary embolism due to OCP use, interstitial lung disease, heterozygous factor V Leiden mutation continue Xarelto  10 mg for prophylaxis daily for prophylaxis.   She tolerates well. continue   Lymphocytopenia Multifactorial, possibly secondary to medication side effect, autoimmune disease.  Continue B12 and folate supplementation.   Vitamin B12 deficiency B12 level is pending Continue oral B12 supplementation.  Folate deficiency Folate level is pending Continue folic acid  supplementation.  Splenomegaly She has no constitutional symptoms. Workup showed JAK2 V617F mutation negative, with reflex to other mutations CALR, MPL, JAK 2 Ex 12-15 mutations negative.  Negative BCR-ABL FISH, negative EBV, CMV. Check peripheral blood flow cytometry. Borderline splenomegaly dated back to November 2023. Repeat ultrasound of spleen in Nov 2024 showed mild splenomegaly 13.5cm Monitor. .   Hypocalcemia Recommend calcium and vitamin D supplementation.     Orders Placed This Encounter  Procedures   CMP (Cancer Center only)    Standing Status:   Future    Expected Date:   06/29/2024    Expiration Date:   09/27/2024   CBC with Differential (Cancer Center Only)    Standing Status:   Future    Expected Date:   06/29/2024    Expiration Date:   09/27/2024   Folate    Standing Status:   Future    Expected Date:   06/29/2024    Expiration Date:   09/27/2024   Vitamin B12    Standing Status:   Future    Expected Date:   06/29/2024    Expiration Date:   09/27/2024   Follow up in 6 months.  All questions were answered. The patient knows to call the clinic with any problems, questions or concerns.  Zelphia Cap, MD, PhD [facility] Hematology Oncology 12/30/2023    HISTORY OF PRESENTING  ILLNESS:  Pamela Mills 42 y.o.  female with past medical history listed as below who  follow-up for unprovoked pulmonary embolism and  anticoagulation. Since  November 2017, patient started to experience persistent dry cough, exertional shortness of breath, progressive swelling in hands and feet, she was referred to see rheumatology. Workup showed seropositive rheumatoid arthritis, Negative anti-CCP but elevated RF Patient was  initially prednisone and later started on methotrexate weekly (started on 08/15/2016) for treatment.. Patient's respiratory symptoms improved and then worsen again. At that point she was referred by her primary care to see Dr. Theotis pulmonary for evaluation. CT without contrast on 07/30/2016 showed basilar and apparent from predominant ground glass opacities in the architectural distortion, with possible minimum lower lobe bronchiectasis suggesting interstitial lung disease. There is also mild the bilateral axillary, subpectoral, supraclavicular, mediastinal adenopathy.lymph nodes are increased in number, mildly enlarged. Largest left axillary measuring 11 mm short axis. Findings favor reactive/systemic process with autoimmune disorders.  Patient's rheumatology symptoms improved with treatment. Patient had wax and wane of her respiratory symptoms. In the summer, she has had 2 round trips of 8 hour car ride back and forth to [place]  where her father lives. Most recent one was on August 5. She has felt lower extremity swelling for which she has to put on stocking to reduce swelling. Also she felt a little stretchy feeling on her cough. She mentioned to Dr. Theotis who ordered a follow-up CT scan. CT without contrast chest on 11/26/2016 showed patchy groundglass in the septal thickening in  both lower lobes, less severe than 07/27/2016. There is new rounded consolidation in the left lower lobe, possible infectious etiology. No definitive evidence of interstitial lung disease. Meanwhile her  respiratory symptoms has become worse. She has experienced coughing, exertional shortness of breath, and mild hemoptysis. This triggered a d-dimer testing which came back high. Patient had CT anginal chest PE protocol on 11/29/2016 which showed acute appearing nonconclusive pulmonary emboli within several segmental pulmonary artery branches to the left lower lobe. Small pleural-based consolidation with the posterior aspect of the left lower lobe presumably associated focal pulmonary infarction. Associated small left pleural effusion. No evidence of associated right heart straining. Questionable focal low-density pulmonary embolus within the segmental pulmonary artery branch to the right middle lobe.  lower extremity ultrasound revealed no DVT within the right lower extremity. Patient was started on Eliquis  5 mg twice a day.  Patient has had some rheumatology/hypercoagulable workup done with [facility]. Reviewing her labs showed on 08/08/2016, she is tested negative for anti-Cardiolite pain antibody IgG < 9, IgM slightly elevated at 15. Beta-2 glycoprotein IgG and IgM normal, normal axial coronal phase phospholipid, DVvT normal, negative lupus anticoagulant.. Serum protein electrophoresis negative for M spike.  11/29/2016, homocystine 8, lupus anticoagulant was retested was negative, mildly low protein S activity at 48%, normal protein S antigen. Slightly low protein S antigen. Normal anti-thrombin function. She is tested positive for single R506Q mutation heterozygous factor V Leiden mutation.  Patient has a family history of ovarian cancer in her mom. She reports that she was tested negative for genetic test.  She takes OCP since she was teenage.  This is her fist episode of blood clot. She was taking of OCP and referred to see gynecology.  # 06/13/2017 Antiphospholipid panel was checked at last visit again.  Which showed negative panel except slightly elevated anti cardiolipin IgM at 15.  Repeat  protein S activity was 75%, protein S antigen at 52%, mildly decreased.  slight positive anti cardiolipin IgM does not meet criteria for Antiphospholipid syndrome 02/02/18 repeat protein antigen was normal at 62%.  08/04/2018, decrease to Eliquis  2.5 mg twice daily. 04/14/2020, switched to Xarelto  10 mg daily due to lack of insurance coverage for Eliquis  July/August 2022 Switched to Lovenox  40mg  daily with pregnancy planning     NSIP (nonspecific interstitial pneumonitis)-patient follows up with rheumatologist and was  treated with CellCept and sulfasalazine.  INTERVAL HISTORY Pamela Mills is a 43 y.o. female who has above history reviewed by me today presents for follow-up management of pulmonary embolism.  Tolerates Xarelto  well.  No bleeding events.  She has follow-up appointment with rheumatologist for NSIP management. She has acid reflux, has an EGD in April 2025- hiatal hernia,    Review of Systems  Constitutional:  Negative for appetite change, chills, fatigue and fever.  HENT:   Negative for hearing loss and voice change.   Eyes:  Negative for eye problems.  Respiratory:  Negative for chest tightness and cough.   Cardiovascular:  Negative for chest pain.  Gastrointestinal:  Negative for abdominal distention, abdominal pain and blood in stool.  Endocrine: Negative for hot flashes.  Genitourinary:  Negative for difficulty urinating and frequency.   Musculoskeletal:  Positive for arthralgias.  Skin:  Negative for itching and rash.  Neurological:  Negative for extremity weakness, headaches and light-headedness.  Hematological:  Negative for adenopathy.  Psychiatric/Behavioral:  Negative for confusion.     MEDICAL HISTORY:  Past Medical History:  Diagnosis Date  Anxiety    Arthritis    Basal cell carcinoma    Post neck - treated years ago    BCC (basal cell carcinoma of skin) 07/05/2020   R upper cutaneous lip- MOHs Dr. Bluford   Cancer [facility])    skin   Collagen vascular  disease    Depression    Diverticulitis    Dysplastic nevus 05/27/2019   R distal ant thigh - moderate   Factor 5 Leiden mutation, heterozygous 2018   GERD (gastroesophageal reflux disease)    Migraines    Preterm labor    Pulmonary embolism (HCC)    Vaginal Pap smear, abnormal     SURGICAL HISTORY: Past Surgical History:  Procedure Laterality Date   CHOLECYSTECTOMY N/A 06/29/2015   Procedure: LAPAROSCOPIC CHOLECYSTECTOMY;  Surgeon: Jina Nephew, MD;  Location: [facility];  Service: General;  Laterality: N/A;   CHROMOPERTUBATION Right 10/17/2017   Procedure: CHROMOPERTUBATION;  Surgeon: Ward, Mitzie BROCKS, MD;  Location: [facility];  Service: Gynecology;  Laterality: Right;   DILATION AND CURETTAGE OF UTERUS  2016   DILATION AND EVACUATION N/A 11/10/2014   Procedure: DILATATION AND EVACUATION;  Surgeon: Mitzie BROCKS Ward, MD;  Location: [facility];  Service: Gynecology;  Laterality: N/A;   FLEXIBLE BRONCHOSCOPY N/A 02/18/2018   Procedure: FLEXIBLE BRONCHOSCOPY;  Surgeon: Parris Manna, MD;  Location: [facility];  Service: Thoracic;  Laterality: N/A;   FOOT SURGERY Left 01/2012   FOOT SURGERY Left 2013   Plantar Fasiciatis   LAPAROSCOPIC ENDOMETRIOSIS FULGURATION  2006   LAPAROSCOPIC UNILATERAL SALPINGO OOPHERECTOMY Left 10/17/2017   Procedure: LAPAROSCOPIC UNILATERAL SALPINGO OOPHORECTOMY;  Surgeon: Ward, Mitzie BROCKS, MD;  Location: [facility];  Service: Gynecology;  Laterality: Left;   LAPAROSCOPY N/A 10/17/2017   Procedure: LAPAROSCOPY OPERATIVE, GRADIE GELINAS;  Surgeon: Ward, Mitzie BROCKS, MD;  Location: [facility];  Service: Gynecology;  Laterality: N/A;   oophotectomy Left 09/2017   2 cysts on the same side also removed   UPPER GI ENDOSCOPY      SOCIAL HISTORY: Social History   Socioeconomic History   Marital status: Married    Spouse name: Ricky   Number of children: Not on file   Years of education: Not on file   Highest education level: Not on file  Occupational  History   Not on file  Tobacco Use   Smoking status: Never   Smokeless tobacco: Never  Vaping Use   Vaping status: Never Used  Substance and Sexual Activity   Alcohol use: No   Drug use: No   Sexual activity: Not Currently  Other Topics Concern   Not on file  Social History Narrative   Not on file   Social Drivers of Health   Financial Resource Strain: Not on file  Food Insecurity: No Food Insecurity (02/03/2023)   Received from [facility]   Hunger Vital Sign    Within the past 12 months, you worried that your food would run out before you got the money to buy more.: Never true    Within the past 12 months, the food you bought just didn't last and you didn't have money to get more.: Never true  Transportation Needs: No Transportation Needs (02/03/2023)   Received from [facility] - Transportation    In the past 12 months, has lack of transportation kept you from medical appointments or from getting medications?: No    Lack of Transportation (Non-Medical): No  Physical Activity: Not on  file  Stress: Not on file  Social Connections: Not on file  Intimate Partner Violence: Not on file    FAMILY HISTORY: Family History  Problem Relation Age of Onset   Ovarian cancer Mother 79   Rheumatologic disease Maternal Aunt    Pancreatic cancer Maternal Grandfather    Colon cancer Paternal Grandmother    Breast cancer Neg Hx     ALLERGIES:  is allergic to other and gluten meal.  MEDICATIONS:  Current Outpatient Medications  Medication Sig Dispense Refill   acetaminophen  (TYLENOL ) 500 MG tablet Take 1,000 mg by mouth every 8 (eight) hours as needed (pain).     cetirizine (ZYRTEC) 10 MG tablet Take 10 mg by mouth daily.     Cholecalciferol (VITAMIN D3) 1000 units CAPS Take 1,000 Units by mouth daily.     escitalopram (LEXAPRO) 5 MG tablet Take 15 mg by mouth every evening.      famotidine  (PEPCID ) 20 MG tablet Take 20 mg by mouth 2  (two) times daily.     folic acid  (FOLVITE ) 1 MG tablet Take 1 mg by mouth daily.  11   metFORMIN (GLUCOPHAGE-XR) 500 MG 24 hr tablet Take 1,000 mg by mouth daily.     methotrexate 50 MG/2ML injection Inject into the skin.     omeprazole (PRILOSEC) 20 MG capsule Take 20 mg by mouth daily.      pregabalin (LYRICA) 150 MG capsule Take 150 mg by mouth 2 (two) times daily.     sulfamethoxazole-trimethoprim (BACTRIM DS) 800-160 MG tablet Take by mouth.     SUMAtriptan (IMITREX) 50 MG tablet Take 50 mg by mouth every 2 (two) hours as needed for migraine.     tirzepatide (ZEPBOUND) 2.5 MG/0.5ML Pen Inject 2.5 mg into the skin.     topiramate (TOPAMAX) 25 MG tablet Take 50 mg by mouth daily.     triamcinolone  (NASACORT ) 55 MCG/ACT AERO nasal inhaler Place 2 sprays into the nose daily.     triamcinolone  lotion (KENALOG ) 0.1 % Apply 1 Application topically as directed. Daily up to 5 days a week to aa scalp prn flares, avoid face, groin, axilla 60 mL 4   vitamin B-12 (CYANOCOBALAMIN ) 1000 MCG tablet Take 1,000 mcg by mouth daily.     XARELTO  10 MG TABS tablet TAKE 1 TABLET BY MOUTH EVERY DAY 90 tablet 1   azithromycin  (ZITHROMAX ) 250 MG tablet Take 1 tablet PO daily for 4 more days starting the day after your visit to the Emergency Department (Patient not taking: Reported on 12/30/2023) 4 each 0   Galcanezumab-gnlm 120 MG/ML SOAJ Inject 120 mg into the skin every 28 (twenty-eight) days. (Patient not taking: Reported on 12/30/2023)     LORazepam (ATIVAN) 0.5 MG tablet Take 0.5 mg by mouth 2 (two) times daily as needed for anxiety.  (Patient not taking: Reported on 12/30/2023)     mupirocin  ointment (BACTROBAN ) 2 % Apply 1 Application topically 3 (three) times daily. (Patient not taking: Reported on 12/30/2023) 22 g 0   predniSONE (DELTASONE) 10 MG tablet Take by mouth. (Patient not taking: Reported on 12/30/2023)     Semaglutide (OZEMPIC, 0.25 OR 0.5 MG/DOSE, [place]) Inject 0.5 mg into the skin once a week. (Patient  not taking: Reported on 12/30/2023)     No current facility-administered medications for this visit.      SABRA  PHYSICAL EXAMINATION: ECOG PERFORMANCE STATUS: 1 - Symptomatic but completely ambulatory Vitals:   12/30/23 1150  BP: 107/76  Pulse: 69  Resp: 20  Temp: 98.6 F (37 C)  SpO2: 100%   Filed Weights   12/30/23 1150  Weight: 205 lb 12.8 oz (93.4 kg)    Physical Exam Constitutional:      General: She is not in acute distress.    Appearance: She is not diaphoretic.  HENT:     Head: Normocephalic and atraumatic.  Eyes:     General: No scleral icterus. Cardiovascular:     Rate and Rhythm: Normal rate and regular rhythm.     Heart sounds: No murmur heard. Pulmonary:     Effort: Pulmonary effort is normal. No respiratory distress.     Breath sounds: No wheezing.  Abdominal:     General: There is no distension.     Palpations: Abdomen is soft.     Tenderness: There is no abdominal tenderness.  Musculoskeletal:        General: No swelling. Normal range of motion.     Cervical back: Normal range of motion and neck supple.  Skin:    General: Skin is warm and dry.     Findings: No erythema.  Neurological:     Mental Status: She is alert and oriented to person, place, and time. Mental status is at baseline.     Cranial Nerves: No cranial nerve deficit.     Motor: No abnormal muscle tone.  Psychiatric:        Mood and Affect: Mood and affect normal.       RADIOGRAPHIC STUDIES: I have personally reviewed the radiological images as listed and agreed with the findings in the report. No results found.

## 2023-12-30 NOTE — Assessment & Plan Note (Signed)
 B12 level is pending Continue oral B12 supplementation.

## 2023-12-30 NOTE — Assessment & Plan Note (Addendum)
 Multifactorial, possibly secondary to medication side effect, autoimmune disease.  Continue B12 and folate supplementation.

## 2023-12-30 NOTE — Assessment & Plan Note (Addendum)
#  History of pulmonary embolism due to OCP use, interstitial lung disease, heterozygous factor V Leiden mutation continue Xarelto  10 mg for prophylaxis daily for prophylaxis.   She tolerates well. continue

## 2023-12-30 NOTE — Assessment & Plan Note (Signed)
 Recommend calcium and vitamin D supplementation.

## 2024-01-10 NOTE — Progress Notes (Signed)
 Discharge summary  Treatment: riTUXimab-abbs (TRUXIMA) 1,000 mg in sodium chloride  0.9% 1,000 mL infusion-Volume (mL): 1000     Pre medications: Tylenol , Benadryl , and Solumedrol       IV: IV placed by this RN, traditional stick.  COMPLICATIONS: No, Patient tolerated infusion well        Care Summary: Specialty focused assessment: Patient arrived to clinic 2A A&Ox4. Treatment plan reviewed. VSS. IV removed. AVS declined. Education handout declined. Pt instructed to go to the ED or UC if any adverse reactions occur after discharge. Pt states understanding. Pt stable at discharge.  Planned interventions for next visit : Continue plan of care.Next appointment on 05/15/24.  FUNCTIONAL STATUS  Fall risk: no  Use of assistive devices: no   Delay(s): None  Questionnaire completed and reviewed: yes Allergies reviewed : yes Medication reviewed: yes

## 2024-01-10 NOTE — Progress Notes (Signed)
 2A Infusion Clinic: Patient Return Appointment Information   Provider: Laurier Dorothyann Rouse, MD   Return Appointment in approximately:  4 months. (Please see Future Appointments in the To Do List section of the After Visit Summary for exact date and time).   Infusion Treatment/Drug: Rituximab   Total Infusion Appointment Time: 5 hours

## 2024-01-11 NOTE — Addendum Note (Signed)
 Encounter addended by: Rozanne Pagan, RN on: 01/11/2024 6:19 PM Actions taken: Charge Capture section accepted

## 2024-01-23 DIAGNOSIS — Z86711 Personal history of pulmonary embolism: Secondary | ICD-10-CM | POA: Diagnosis not present

## 2024-01-27 ENCOUNTER — Encounter: Payer: Self-pay | Admitting: Oncology

## 2024-06-29 ENCOUNTER — Ambulatory Visit: Admitting: Oncology

## 2024-06-29 ENCOUNTER — Other Ambulatory Visit

## 2024-10-05 ENCOUNTER — Ambulatory Visit: Admitting: Dermatology
